# Patient Record
Sex: Female | Born: 1955
Health system: Southern US, Community
[De-identification: ages and names within clinical notes are randomized; demographics above are authoritative.]

## PROBLEM LIST (undated history)

## (undated) DIAGNOSIS — K5150 Left sided colitis without complications: Secondary | ICD-10-CM

## (undated) DIAGNOSIS — K5790 Diverticulosis of intestine, part unspecified, without perforation or abscess without bleeding: Secondary | ICD-10-CM

## (undated) DIAGNOSIS — Z860100 Personal history of colon polyps, unspecified: Secondary | ICD-10-CM

## (undated) DIAGNOSIS — I10 Essential (primary) hypertension: Secondary | ICD-10-CM

## (undated) DIAGNOSIS — E785 Hyperlipidemia, unspecified: Secondary | ICD-10-CM

## (undated) DIAGNOSIS — K649 Unspecified hemorrhoids: Secondary | ICD-10-CM

## (undated) DIAGNOSIS — C539 Malignant neoplasm of cervix uteri, unspecified: Secondary | ICD-10-CM

## (undated) DIAGNOSIS — L409 Psoriasis, unspecified: Secondary | ICD-10-CM

## (undated) DIAGNOSIS — K219 Gastro-esophageal reflux disease without esophagitis: Secondary | ICD-10-CM

## (undated) DIAGNOSIS — E559 Vitamin D deficiency, unspecified: Secondary | ICD-10-CM

## (undated) HISTORY — DX: Gastro-esophageal reflux disease without esophagitis: K21.9

## (undated) HISTORY — DX: Essential (primary) hypertension: I10

## (undated) HISTORY — PX: EYE SURGERY: SHX253

## (undated) HISTORY — DX: Psoriasis, unspecified: L40.9

## (undated) HISTORY — DX: Vitamin D deficiency, unspecified: E55.9

## (undated) HISTORY — PX: BREAST BIOPSY: SHX20

## (undated) HISTORY — PX: TOTAL ABDOMINAL HYSTERECTOMY: SHX209

## (undated) HISTORY — PX: CHOLECYSTECTOMY: SHX55

## (undated) HISTORY — PX: DIAGNOSTIC LAPAROSCOPY: SUR761

## (undated) HISTORY — PX: UPPER GASTROINTESTINAL ENDOSCOPY: SHX188

## (undated) HISTORY — PX: ABDOMINAL HYSTERECTOMY: SHX81

---

## 2004-09-23 ENCOUNTER — Ambulatory Visit: Payer: Self-pay | Admitting: General Practice

## 2006-07-14 ENCOUNTER — Ambulatory Visit: Payer: Self-pay | Admitting: Family Medicine

## 2006-09-15 ENCOUNTER — Ambulatory Visit: Payer: Self-pay | Admitting: Unknown Physician Specialty

## 2006-09-21 ENCOUNTER — Ambulatory Visit: Payer: Self-pay | Admitting: Unknown Physician Specialty

## 2007-03-01 ENCOUNTER — Ambulatory Visit: Payer: Self-pay | Admitting: Unknown Physician Specialty

## 2007-03-29 ENCOUNTER — Ambulatory Visit: Payer: Self-pay | Admitting: Unknown Physician Specialty

## 2007-05-24 ENCOUNTER — Ambulatory Visit: Payer: Self-pay | Admitting: Unknown Physician Specialty

## 2007-11-29 DIAGNOSIS — D239 Other benign neoplasm of skin, unspecified: Secondary | ICD-10-CM

## 2007-11-29 HISTORY — DX: Other benign neoplasm of skin, unspecified: D23.9

## 2008-08-26 ENCOUNTER — Ambulatory Visit: Payer: Self-pay | Admitting: Unknown Physician Specialty

## 2008-10-16 ENCOUNTER — Ambulatory Visit: Payer: Self-pay | Admitting: Unknown Physician Specialty

## 2008-11-05 ENCOUNTER — Ambulatory Visit: Payer: Self-pay | Admitting: Unknown Physician Specialty

## 2008-11-05 LAB — HM COLONOSCOPY: HM Colonoscopy: NORMAL

## 2009-07-23 ENCOUNTER — Ambulatory Visit: Payer: Self-pay | Admitting: Ophthalmology

## 2009-08-20 ENCOUNTER — Ambulatory Visit: Payer: Self-pay | Admitting: Ophthalmology

## 2009-10-23 ENCOUNTER — Ambulatory Visit: Payer: Self-pay | Admitting: Unknown Physician Specialty

## 2010-01-12 ENCOUNTER — Other Ambulatory Visit: Payer: Self-pay

## 2010-01-21 ENCOUNTER — Ambulatory Visit: Payer: Self-pay | Admitting: Unknown Physician Specialty

## 2010-07-15 ENCOUNTER — Other Ambulatory Visit: Payer: Self-pay

## 2010-10-12 ENCOUNTER — Ambulatory Visit: Payer: Self-pay | Admitting: Ophthalmology

## 2010-10-12 DIAGNOSIS — I10 Essential (primary) hypertension: Secondary | ICD-10-CM

## 2010-10-13 ENCOUNTER — Other Ambulatory Visit: Payer: Self-pay | Admitting: Unknown Physician Specialty

## 2010-10-14 ENCOUNTER — Ambulatory Visit: Payer: Self-pay | Admitting: Ophthalmology

## 2010-12-08 ENCOUNTER — Other Ambulatory Visit: Payer: Self-pay

## 2011-05-19 ENCOUNTER — Other Ambulatory Visit: Payer: Self-pay | Admitting: Unknown Physician Specialty

## 2011-05-19 ENCOUNTER — Ambulatory Visit: Payer: Self-pay | Admitting: General Practice

## 2011-05-24 ENCOUNTER — Ambulatory Visit: Payer: Self-pay | Admitting: Unknown Physician Specialty

## 2011-06-05 ENCOUNTER — Ambulatory Visit: Payer: Self-pay | Admitting: General Practice

## 2011-07-06 ENCOUNTER — Ambulatory Visit: Payer: Self-pay | Admitting: General Practice

## 2012-01-11 ENCOUNTER — Ambulatory Visit: Payer: Self-pay | Admitting: Unknown Physician Specialty

## 2012-01-18 ENCOUNTER — Ambulatory Visit: Payer: Self-pay | Admitting: Cardiology

## 2012-01-24 ENCOUNTER — Ambulatory Visit: Payer: Self-pay | Admitting: Cardiology

## 2012-05-24 ENCOUNTER — Ambulatory Visit: Payer: Self-pay

## 2012-08-07 ENCOUNTER — Ambulatory Visit: Payer: Self-pay | Admitting: General Practice

## 2012-11-01 LAB — HM PAP SMEAR: HM PAP: NORMAL

## 2013-05-17 ENCOUNTER — Other Ambulatory Visit: Payer: Self-pay

## 2013-05-17 LAB — CBC WITH DIFFERENTIAL/PLATELET
Basophil #: 0 10*3/uL (ref 0.0–0.1)
Basophil %: 0.4 %
Eosinophil #: 0.2 10*3/uL (ref 0.0–0.7)
Eosinophil %: 2.6 %
HCT: 39.4 % (ref 35.0–47.0)
HGB: 13.7 g/dL (ref 12.0–16.0)
Lymphocyte #: 2.1 10*3/uL (ref 1.0–3.6)
Lymphocyte %: 30.3 %
MCH: 32.7 pg (ref 26.0–34.0)
MCHC: 34.7 g/dL (ref 32.0–36.0)
MCV: 94 fL (ref 80–100)
Monocyte #: 0.7 x10 3/mm (ref 0.2–0.9)
Monocyte %: 9.6 %
Neutrophil #: 3.9 10*3/uL (ref 1.4–6.5)
Neutrophil %: 57.1 %
Platelet: 241 10*3/uL (ref 150–440)
RBC: 4.18 10*6/uL (ref 3.80–5.20)
RDW: 13.5 % (ref 11.5–14.5)
WBC: 6.9 10*3/uL (ref 3.6–11.0)

## 2013-05-17 LAB — BASIC METABOLIC PANEL
Anion Gap: 3 — ABNORMAL LOW (ref 7–16)
BUN: 17 mg/dL (ref 7–18)
Calcium, Total: 9.2 mg/dL (ref 8.5–10.1)
Chloride: 105 mmol/L (ref 98–107)
Co2: 29 mmol/L (ref 21–32)
Creatinine: 0.89 mg/dL (ref 0.60–1.30)
EGFR (African American): >60
EGFR (Non-African Amer.): >60
Glucose: 86 mg/dL (ref 65–99)
Osmolality: 275 (ref 275–301)
Potassium: 3.8 mmol/L (ref 3.5–5.1)
Sodium: 137 mmol/L (ref 136–145)

## 2013-05-17 LAB — SEDIMENTATION RATE: Erythrocyte Sed Rate: 7 mm/hr (ref 0–30)

## 2013-05-29 ENCOUNTER — Ambulatory Visit: Payer: Self-pay | Admitting: Family Medicine

## 2013-06-14 ENCOUNTER — Ambulatory Visit: Payer: Self-pay | Admitting: Family Medicine

## 2014-05-23 ENCOUNTER — Ambulatory Visit: Payer: Self-pay | Admitting: Unknown Physician Specialty

## 2014-06-03 ENCOUNTER — Ambulatory Visit: Payer: Self-pay | Admitting: Family Medicine

## 2014-06-03 LAB — HM MAMMOGRAPHY: HM MAMMO: NORMAL

## 2014-06-04 HISTORY — PX: GALLBLADDER SURGERY: SHX652

## 2014-06-20 ENCOUNTER — Ambulatory Visit: Payer: Self-pay | Admitting: Surgery

## 2014-06-25 ENCOUNTER — Ambulatory Visit: Payer: Self-pay | Admitting: Surgery

## 2014-10-22 LAB — LIPID PANEL
Cholesterol: 240 mg/dL — AB (ref 0–200)
HDL: 64 mg/dL (ref 35–70)
LDL Cholesterol: 148 mg/dL
Triglycerides: 139 mg/dL (ref 40–160)

## 2014-10-22 LAB — BASIC METABOLIC PANEL
CREATININE: 0.7 mg/dL (ref <=1.1)
Glucose: 80 mg/dL

## 2014-10-26 NOTE — Op Note (Signed)
PATIENT NAME:  Theresa Gross, Theresa Gross MR#:  [id number] DATE OF BIRTH:  [birth year]  DATE OF PROCEDURE:  06/25/2014   PREOPERATIVE DIAGNOSIS: Chronic cholecystitis, cholelithiasis.   POSTOPERATIVE DIAGNOSIS: Chronic cholecystitis, cholelithiasis.   PROCEDURE: Laparoscopic cholecystectomy, cholangiogram.   SURGEON: Loreli Dollar, MD  ANESTHESIA: General.   INDICATIONS: This 59 year old has a history of moderate discomfort in the right upper quadrant and frequent episodes of mild nausea. CT scan demonstrated multiple gallstones and surgery was recommended for definitive treatment.   DESCRIPTION OF PROCEDURE: The patient was placed on the operating table in the supine position under general endotracheal anesthesia. The abdomen was prepared with ChloraPrep and draped in a sterile manner.   A short incision was made in the inferior aspect of the umbilicus and carried down through about 4 cm of fatty tissue to encounter the deep fascia, which was grasped with a laryngeal hook and elevated. A Veress needle was inserted, aspirated and irrigated with a saline solution. Next, the peritoneal cavity was inflated with carbon dioxide, and the Veress needle was removed. The 10 mm cannula was inserted. The 10 mm, 0 degree laparoscope was inserted to view the peritoneal cavity. It appeared that the laparoscope was beneath the omentum. The transverse colon was noted. Small bowel appeared typical. With some manipulation, the scope was brought out anterior to the omentum, and then viewed the liver, which had a smooth surface. Location of the gallbladder was demonstrated. Stomach appeared typical. Another incision was made in the epigastrium, slightly to the right of the midline, to introduce an 11 mm cannula. Two incisions were made in the lateral aspect of the right upper quadrant to introduce two 5 mm cannulas.   The gallbladder was retracted towards the right shoulder. A number of adhesions between the liver and the  anterior abdominal wall were lysed sharply with the scissors to allow better traction of the gallbladder. The pouch of Randol Kern was retracted inferiorly and laterally. The porta hepatis was demonstrated. The gallbladder neck was mobilized with incision of the visceral peritoneum. The cystic duct was dissected free from surrounding structures. The cystic artery was dissected free from surrounding structures. One branch of the cystic artery was controlled with Endo Clips and divided. A critical view of the safety was demonstrated. An Endo Clip was placed across the cystic duct, adjacent to the neck of the gallbladder. An incision was made in the cystic duct to introduce a Reddick catheter. Half-strength Conray 60 dye was injected as the cholangiogram was done with fluoroscopy, demonstrating the biliary tree and flow of dye into the duodenum. No retained stones were seen. The Reddick catheter was removed. The cystic duct was doubly ligated with Endo Clips and divided. The cystic artery was controlled with double Endo Clips and divided. The gallbladder was further dissected free from the liver with hook and cautery. Bleeding was very minimal. Hemostasis was subsequently intact. The gallbladder was completely separated and was brought up through the infraumbilical incision, opened, and suctioned. There were multiple stones demonstrated, and these were removed with the stone scoop. It was noted that, with the large size of the stones and some of the stones were hard and could not be crushed, it was necessary to lengthen the skin incision by about 1 cm and also lengthen the fascial incision by 1 cm, which allowed removal of the gallbladder with the remaining stones, and the gallbladder with stones was submitted in formalin for routine pathology.   The right upper quadrant was further inspected. Hemostasis  was intact. The cannulas were removed. Carbon dioxide was allowed to escape from the peritoneal cavity. The fascial  defect at the umbilicus was closed with interrupted 0 Maxon figure-of-eight sutures, placing 2 figure-of-eight sutures. Next, the skin incisions were closed with interrupted 5-0 chromic subcuticular suture, Benzoin, and Steri-Strips. Dressings were applied with paper tape. The patient tolerated surgery satisfactorily and was prepared for transfer to the recovery room.    ____________________________ Lenna Sciara. Rochel Brome, MD [email or web address] D: 06/25/2014 12:49:00 ET T: 06/25/2014 12:59:44 ET JOB#: [id number]  cc: Loreli Dollar, MD, <Dictator> Loreli Dollar MD ELECTRONICALLY SIGNED 07/01/2014 12:31

## 2014-10-28 DIAGNOSIS — E876 Hypokalemia: Secondary | ICD-10-CM | POA: Insufficient documentation

## 2014-10-28 DIAGNOSIS — K219 Gastro-esophageal reflux disease without esophagitis: Secondary | ICD-10-CM | POA: Insufficient documentation

## 2014-10-28 DIAGNOSIS — Z872 Personal history of diseases of the skin and subcutaneous tissue: Secondary | ICD-10-CM | POA: Insufficient documentation

## 2014-10-28 DIAGNOSIS — E7849 Other hyperlipidemia: Secondary | ICD-10-CM | POA: Insufficient documentation

## 2014-10-28 DIAGNOSIS — I10 Essential (primary) hypertension: Secondary | ICD-10-CM | POA: Insufficient documentation

## 2014-10-28 LAB — SURGICAL PATHOLOGY

## 2015-04-08 ENCOUNTER — Other Ambulatory Visit
Admission: RE | Admit: 2015-04-08 | Discharge: 2015-04-08 | Disposition: A | Discharge status: Home / Self Care | Payer: PRIVATE HEALTH INSURANCE | Source: Other Acute Inpatient Hospital | Attending: Nurse Practitioner | Admitting: Nurse Practitioner

## 2015-04-08 DIAGNOSIS — R197 Diarrhea, unspecified: Secondary | ICD-10-CM | POA: Insufficient documentation

## 2015-04-08 LAB — C DIFFICILE QUICK SCREEN W PCR REFLEX
C Diff antigen: NEGATIVE
C Diff interpretation: NEGATIVE
C Diff toxin: NEGATIVE

## 2015-04-11 LAB — STOOL CULTURE

## 2015-04-11 LAB — GIARDIA, EIA; OVA/PARASITE: Giardia Ag, Stl: NEGATIVE

## 2015-04-11 LAB — O&P RESULT

## 2015-10-22 ENCOUNTER — Encounter: Payer: Self-pay | Admitting: Family Medicine

## 2015-10-22 ENCOUNTER — Ambulatory Visit (INDEPENDENT_AMBULATORY_CARE_PROVIDER_SITE_OTHER): Payer: BLUE CROSS/BLUE SHIELD | Admitting: Family Medicine

## 2015-10-22 VITALS — BP 130/92 | HR 72 | Ht 66.0 in | Wt 228.0 lb

## 2015-10-22 DIAGNOSIS — E784 Other hyperlipidemia: Secondary | ICD-10-CM | POA: Diagnosis not present

## 2015-10-22 DIAGNOSIS — Z1211 Encounter for screening for malignant neoplasm of colon: Secondary | ICD-10-CM | POA: Diagnosis not present

## 2015-10-22 DIAGNOSIS — I10 Essential (primary) hypertension: Secondary | ICD-10-CM

## 2015-10-22 DIAGNOSIS — R920 Mammographic microcalcification found on diagnostic imaging of breast: Secondary | ICD-10-CM | POA: Diagnosis not present

## 2015-10-22 DIAGNOSIS — E7849 Other hyperlipidemia: Secondary | ICD-10-CM

## 2015-10-22 DIAGNOSIS — Z0000 Encounter for general adult medical examination without abnormal findings: Secondary | ICD-10-CM | POA: Diagnosis not present

## 2015-10-22 DIAGNOSIS — Z1239 Encounter for other screening for malignant neoplasm of breast: Secondary | ICD-10-CM | POA: Diagnosis not present

## 2015-10-22 LAB — HEMOCCULT GUIAC POC 1CARD (OFFICE): Fecal Occult Blood, POC: NEGATIVE

## 2015-10-22 MED ORDER — HYDROCHLOROTHIAZIDE 12.5 MG PO TABS: 12.5000 mg | ORAL_TABLET | Freq: Every day | ORAL | Status: DC

## 2015-10-22 NOTE — Progress Notes (Signed)
Name: Theresa Gross   MRN: [id number]    DOB: Jul 15, 1955   Date:10/22/2015       Progress Note  Subjective  Chief Complaint  Chief Complaint  Patient presents with  . Annual Exam    HPI Comments: Patient presents for annual physical exam.   No problem-specific assessment & plan notes found for this encounter.   Past Medical History  Diagnosis Date  . Vitamin D deficiency   . GERD (gastroesophageal reflux disease)     Past Surgical History  Procedure Laterality Date  . Gallbladder surgery  06/04/2014    History reviewed. No pertinent family history.  Social History   Social History  . Marital Status: Married    Spouse Name: N/A  . Number of Children: N/A  . Years of Education: N/A   Occupational History  . Not on file.   Social History Main Topics  . Smoking status: Former Research scientist (life sciences)  . Smokeless tobacco: Not on file  . Alcohol Use: 0.0 oz/week    0 Standard drinks or equivalent per week  . Drug Use: No  . Sexual Activity: Not on file   Other Topics Concern  . Not on file   Social History Narrative    Allergies  Allergen Reactions  . Lipitor  [Atorvastatin]   . Metronidazole      Review of Systems  Constitutional: Negative for fever, chills, weight loss and malaise/fatigue.  HENT: Negative for ear discharge, ear pain and sore throat.   Eyes: Negative for blurred vision.  Respiratory: Negative for cough, sputum production, shortness of breath and wheezing.   Cardiovascular: Negative for chest pain, palpitations and leg swelling.  Gastrointestinal: Negative for heartburn, nausea, abdominal pain, diarrhea, constipation, blood in stool and melena.  Genitourinary: Negative for dysuria, urgency, frequency and hematuria.  Musculoskeletal: Negative for myalgias, back pain, joint pain and neck pain.  Skin: Negative for rash.  Neurological: Negative for dizziness, tingling, sensory change, focal weakness and headaches.  Endo/Heme/Allergies: Negative for  environmental allergies and polydipsia. Does not bruise/bleed easily.  Psychiatric/Behavioral: Negative for depression and suicidal ideas. The patient is not nervous/anxious and does not have insomnia.      Objective  Filed Vitals:   10/22/15 0830  BP: 130/92  Pulse: 72  Height: 5\' 6"  (1.676 m)  Weight: 228 lb (103.42 kg)    Physical Exam  Constitutional: She is well-developed, well-nourished, and in no distress. No distress.  HENT:  Head: Normocephalic and atraumatic.  Right Ear: External ear normal.  Left Ear: External ear normal.  Nose: Nose normal.  Mouth/Throat: Oropharynx is clear and moist.  Eyes: Conjunctivae and EOM are normal. Pupils are equal, round, and reactive to light. Right eye exhibits no discharge. Left eye exhibits no discharge.  Neck: Normal range of motion. Neck supple. No JVD present. No thyromegaly present.  Cardiovascular: Normal rate, regular rhythm, normal heart sounds and intact distal pulses.  Exam reveals no gallop and no friction rub.   No murmur heard. Pulmonary/Chest: Effort normal and breath sounds normal. Right breast exhibits no inverted nipple, no mass, no nipple discharge, no skin change and no tenderness. Left breast exhibits no inverted nipple, no mass, no nipple discharge, no skin change and no tenderness. Breasts are symmetrical.  Abdominal: Soft. Bowel sounds are normal. She exhibits no mass. There is no tenderness. There is no guarding.  Genitourinary: Rectum normal and vulva normal.  Musculoskeletal: Normal range of motion. She exhibits no edema.  Lymphadenopathy:    She has  no cervical adenopathy.  Neurological: She is alert. She has normal reflexes.  Skin: Skin is warm and dry. She is not diaphoretic.  Psychiatric: Mood and affect normal.  Nursing note and vitals reviewed.     Assessment & Plan  Problem List Items Addressed This Visit      Cardiovascular and Mediastinum   Essential (primary) hypertension   Relevant  Medications   hydrochlorothiazide (HYDRODIURIL) 12.5 MG tablet     Other   Familial multiple lipoprotein-type hyperlipidemia   Relevant Medications   hydrochlorothiazide (HYDRODIURIL) 12.5 MG tablet    Other Visit Diagnoses    Annual physical exam    -  Primary    Colon cancer screening        Relevant Orders    POCT Occult Blood Stool (Completed)    Breast cancer screening        Relevant Orders    MM Digital Screening    Abnormal mammogram with microcalcification        Relevant Orders    MM Digital Diagnostic Bilat    US BREAST LTD UNI RIGHT INC AXILLA    US BREAST LTD UNI LEFT INC AXILLA         Dr. Khyrie Masi [facility]  10/22/2015

## 2015-11-04 ENCOUNTER — Ambulatory Visit
Admission: RE | Admit: 2015-11-04 | Discharge: 2015-11-04 | Disposition: A | Discharge status: Home / Self Care | Payer: BLUE CROSS/BLUE SHIELD | Source: Ambulatory Visit | Attending: Family Medicine | Admitting: Family Medicine

## 2015-11-04 DIAGNOSIS — R921 Mammographic calcification found on diagnostic imaging of breast: Secondary | ICD-10-CM | POA: Insufficient documentation

## 2015-11-04 DIAGNOSIS — R920 Mammographic microcalcification found on diagnostic imaging of breast: Secondary | ICD-10-CM

## 2015-11-04 HISTORY — DX: Malignant neoplasm of cervix uteri, unspecified: C53.9

## 2015-11-19 ENCOUNTER — Encounter: Payer: Self-pay | Admitting: Family Medicine

## 2015-11-19 ENCOUNTER — Ambulatory Visit (INDEPENDENT_AMBULATORY_CARE_PROVIDER_SITE_OTHER): Payer: BLUE CROSS/BLUE SHIELD | Admitting: Family Medicine

## 2015-11-19 VITALS — BP 122/80 | HR 76 | Ht 66.0 in | Wt 227.0 lb

## 2015-11-19 DIAGNOSIS — I10 Essential (primary) hypertension: Secondary | ICD-10-CM

## 2015-11-19 MED ORDER — HYDROCHLOROTHIAZIDE 12.5 MG PO TABS: 12.5000 mg | ORAL_TABLET | Freq: Every day | ORAL | Status: DC

## 2015-11-19 NOTE — Patient Instructions (Signed)
Ankle Sprain  An ankle sprain is an injury to the strong, fibrous tissues (ligaments) that hold the bones of your ankle joint together.   CAUSES  An ankle sprain is usually caused by a fall or by twisting your ankle. Ankle sprains most commonly occur when you step on the outer edge of your foot, and your ankle turns inward. People who participate in sports are more prone to these types of injuries.   SYMPTOMS    Pain in your ankle. The pain may be present at rest or only when you are trying to stand or walk.   Swelling.   Bruising. Bruising may develop immediately or within 1 to 2 days after your injury.   Difficulty standing or walking, particularly when turning corners or changing directions.  DIAGNOSIS   Your caregiver will ask you details about your injury and perform a physical exam of your ankle to determine if you have an ankle sprain. During the physical exam, your caregiver will press on and apply pressure to specific areas of your foot and ankle. Your caregiver will try to move your ankle in certain ways. An X-ray exam may be done to be sure a bone was not broken or a ligament did not separate from one of the bones in your ankle (avulsion fracture).   TREATMENT   Certain types of braces can help stabilize your ankle. Your caregiver can make a recommendation for this. Your caregiver may recommend the use of medicine for pain. If your sprain is severe, your caregiver may refer you to a surgeon who helps to restore function to parts of your skeletal system (orthopedist) or a physical therapist.  HOME CARE INSTRUCTIONS    Apply ice to your injury for 1-2 days or as directed by your caregiver. Applying ice helps to reduce inflammation and pain.    Put ice in a plastic bag.    Place a towel between your skin and the bag.    Leave the ice on for 15-20 minutes at a time, every 2 hours while you are awake.   Only take over-the-counter or prescription medicines for pain, discomfort, or fever as directed by  your caregiver.   Elevate your injured ankle above the level of your heart as much as possible for 2-3 days.   If your caregiver recommends crutches, use them as instructed. Gradually put weight on the affected ankle. Continue to use crutches or a cane until you can walk without feeling pain in your ankle.   If you have a plaster splint, wear the splint as directed by your caregiver. Do not rest it on anything harder than a pillow for the first 24 hours. Do not put weight on it. Do not get it wet. You may take it off to take a shower or bath.   You may have been given an elastic bandage to wear around your ankle to provide support. If the elastic bandage is too tight (you have numbness or tingling in your foot or your foot becomes cold and blue), adjust the bandage to make it comfortable.   If you have an air splint, you may blow more air into it or let air out to make it more comfortable. You may take your splint off at night and before taking a shower or bath. Wiggle your toes in the splint several times per day to decrease swelling.  SEEK MEDICAL CARE IF:    You have rapidly increasing bruising or swelling.   Your toes feel   extremely cold or you lose feeling in your foot.   Your pain is not relieved with medicine.  SEEK IMMEDIATE MEDICAL CARE IF:   Your toes are numb or blue.   You have severe pain that is increasing.  MAKE SURE YOU:    Understand these instructions.   Will watch your condition.   Will get help right away if you are not doing well or get worse.     This information is not intended to replace advice given to you by your health care provider. Make sure you discuss any questions you have with your health care provider.     Document Released: 06/21/2005 Document Revised: 07/12/2014 Document Reviewed: 07/03/2011  Elsevier Interactive Patient Education 2016 [employer] Inc[employer]

## 2015-11-19 NOTE — Progress Notes (Signed)
Name: Theresa Gross   MRN: [id number]    DOB: 04-14-1956   Date:11/19/2015       Progress Note  Subjective  Chief Complaint  Chief Complaint  Patient presents with  . Hypertension    follow up on HCTZ    Hypertension This is a new problem. The current episode started more than 1 month ago. The problem has been gradually improving since onset. The problem is controlled. Pertinent negatives include no anxiety, blurred vision, chest pain, headaches, malaise/fatigue, neck pain, orthopnea, palpitations, peripheral edema, PND, shortness of breath or sweats. There are no associated agents to hypertension. There are no known risk factors for coronary artery disease. Past treatments include diuretics. The current treatment provides moderate improvement. There are no compliance problems.  There is no history of angina, kidney disease, CAD/MI, CVA, heart failure, left ventricular hypertrophy, PVD, renovascular disease or retinopathy. There is no history of chronic renal disease or a hypertension causing med.    No problem-specific assessment & plan notes found for this encounter.   Past Medical History  Diagnosis Date  . Vitamin D deficiency   . GERD (gastroesophageal reflux disease)   . Cervical cancer [facility])     age 60    Past Surgical History  Procedure Laterality Date  . Gallbladder surgery  06/04/2014  . Breast biopsy Right     Family History  Problem Relation Age of Onset  . Colon cancer Mother     Social History   Social History  . Marital Status: Married    Spouse Name: N/A  . Number of Children: N/A  . Years of Education: N/A   Occupational History  . Not on file.   Social History Main Topics  . Smoking status: Former Research scientist (life sciences)  . Smokeless tobacco: Not on file  . Alcohol Use: 0.0 oz/week    0 Standard drinks or equivalent per week  . Drug Use: No  . Sexual Activity: Not on file   Other Topics Concern  . Not on file   Social History Narrative    Allergies   Allergen Reactions  . Lipitor  [Atorvastatin]   . Metronidazole      Review of Systems  Constitutional: Negative for fever, chills, weight loss and malaise/fatigue.  HENT: Negative for ear discharge, ear pain and sore throat.   Eyes: Negative for blurred vision.  Respiratory: Negative for cough, sputum production, shortness of breath and wheezing.   Cardiovascular: Negative for chest pain, palpitations, orthopnea, leg swelling and PND.  Gastrointestinal: Negative for heartburn, nausea, abdominal pain, diarrhea, constipation, blood in stool and melena.  Genitourinary: Negative for dysuria, urgency, frequency and hematuria.  Musculoskeletal: Negative for myalgias, back pain, joint pain and neck pain.  Skin: Negative for rash.  Neurological: Negative for dizziness, tingling, sensory change, focal weakness and headaches.  Endo/Heme/Allergies: Negative for environmental allergies and polydipsia. Does not bruise/bleed easily.  Psychiatric/Behavioral: Negative for depression and suicidal ideas. The patient is not nervous/anxious and does not have insomnia.      Objective  Filed Vitals:   11/19/15 0840  BP: 122/80  Pulse: 76  Height: 5\' 6"  (1.676 m)  Weight: 227 lb (102.967 kg)    Physical Exam  Constitutional: She is well-developed, well-nourished, and in no distress. No distress.  HENT:  Head: Normocephalic and atraumatic.  Right Ear: External ear normal.  Left Ear: External ear normal.  Nose: Nose normal.  Mouth/Throat: Oropharynx is clear and moist.  Eyes: Conjunctivae and EOM are normal. Pupils are  equal, round, and reactive to light. Right eye exhibits no discharge. Left eye exhibits no discharge.  Neck: Normal range of motion. Neck supple. No JVD present. No thyromegaly present.  Cardiovascular: Normal rate, regular rhythm, normal heart sounds and intact distal pulses.  Exam reveals no gallop and no friction rub.   No murmur heard. Pulmonary/Chest: Effort normal and  breath sounds normal.  Abdominal: Soft. Bowel sounds are normal. She exhibits no mass. There is no tenderness. There is no guarding.  Musculoskeletal: Normal range of motion. She exhibits no edema.  Lymphadenopathy:    She has no cervical adenopathy.  Neurological: She is alert.  Skin: Skin is warm and dry. She is not diaphoretic.  Psychiatric: Mood and affect normal.  Nursing note and vitals reviewed.     Assessment & Plan  Problem List Items Addressed This Visit      Cardiovascular and Mediastinum   Essential (primary) hypertension - Primary   Relevant Medications   hydrochlorothiazide (HYDRODIURIL) 12.5 MG tablet   Other Relevant Orders   Renal Function Panel        Dr. Otilio Miu [facility]  11/19/2015

## 2015-11-20 LAB — RENAL FUNCTION PANEL
Albumin: 4.2 g/dL (ref 3.6–4.8)
BUN/Creatinine Ratio: 18 (ref 12–28)
BUN: 13 mg/dL (ref 8–27)
CALCIUM: 9.4 mg/dL (ref 8.7–10.3)
CHLORIDE: 98 mmol/L (ref 96–106)
CO2: 22 mmol/L (ref 18–29)
CREATININE: 0.74 mg/dL (ref 0.57–1.00)
GFR calc Af Amer: 101 mL/min/{1.73_m2} (ref >59)
GFR calc non Af Amer: 88 mL/min/{1.73_m2} (ref >59)
Glucose: 83 mg/dL (ref 65–99)
PHOSPHORUS: 3.6 mg/dL (ref 2.5–4.5)
Potassium: 4.1 mmol/L (ref 3.5–5.2)
Sodium: 142 mmol/L (ref 134–144)

## 2015-11-22 LAB — SPECIMEN STATUS REPORT

## 2015-11-22 LAB — LIPID PANEL WITH LDL/HDL RATIO
Cholesterol, Total: 264 mg/dL — ABNORMAL HIGH (ref 100–199)
HDL: 56 mg/dL (ref >39)
LDL CALC: 167 mg/dL — AB (ref 0–99)
LDL/HDL RATIO: 3 ratio (ref 0.0–3.2)
Triglycerides: 204 mg/dL — ABNORMAL HIGH (ref 0–149)
VLDL CHOLESTEROL CAL: 41 mg/dL — AB (ref 5–40)

## 2016-05-25 ENCOUNTER — Encounter: Payer: Self-pay | Admitting: Family Medicine

## 2016-05-25 ENCOUNTER — Ambulatory Visit (INDEPENDENT_AMBULATORY_CARE_PROVIDER_SITE_OTHER): Payer: BLUE CROSS/BLUE SHIELD | Admitting: Family Medicine

## 2016-05-25 VITALS — BP 120/80 | HR 70 | Ht 66.0 in | Wt 231.0 lb

## 2016-05-25 DIAGNOSIS — E782 Mixed hyperlipidemia: Secondary | ICD-10-CM

## 2016-05-25 DIAGNOSIS — K760 Fatty (change of) liver, not elsewhere classified: Secondary | ICD-10-CM

## 2016-05-25 DIAGNOSIS — I10 Essential (primary) hypertension: Secondary | ICD-10-CM

## 2016-05-25 DIAGNOSIS — I700 Atherosclerosis of aorta: Secondary | ICD-10-CM | POA: Insufficient documentation

## 2016-05-25 DIAGNOSIS — H6121 Impacted cerumen, right ear: Secondary | ICD-10-CM | POA: Diagnosis not present

## 2016-05-25 DIAGNOSIS — Z1159 Encounter for screening for other viral diseases: Secondary | ICD-10-CM | POA: Diagnosis not present

## 2016-05-25 MED ORDER — HYDROCHLOROTHIAZIDE 12.5 MG PO TABS: 12.5000 mg | ORAL_TABLET | Freq: Every day | ORAL | 1 refills | Status: DC

## 2016-05-25 NOTE — Progress Notes (Signed)
Name: Theresa Gross   MRN: [id number]    DOB: 10-10-1955   Date:05/25/2016       Progress Note  Subjective  Chief Complaint  Chief Complaint  Patient presents with  . Hypertension    needs refill on B/P med    Hypertension  This is a chronic problem. The current episode started more than 1 year ago. The problem has been gradually improving since onset. The problem is controlled. Pertinent negatives include no anxiety, blurred vision, chest pain, headaches, malaise/fatigue, neck pain, orthopnea, palpitations, peripheral edema, PND, shortness of breath or sweats. There are no associated agents to hypertension. Risk factors for coronary artery disease include obesity and post-menopausal state. Past treatments include diuretics. The current treatment provides mild improvement. There are no compliance problems.  There is no history of angina, kidney disease, CAD/MI, CVA, heart failure, left ventricular hypertrophy, PVD, renovascular disease or retinopathy. There is no history of chronic renal disease or a hypertension causing med.    No problem-specific Assessment & Plan notes found for this encounter.   Past Medical History:  Diagnosis Date  . Cervical cancer [facility])    age 12  . GERD (gastroesophageal reflux disease)   . Hypertension   . Vitamin D deficiency     Past Surgical History:  Procedure Laterality Date  . BREAST BIOPSY Right   . GALLBLADDER SURGERY  06/04/2014    Family History  Problem Relation Age of Onset  . Colon cancer Mother     Social History   Social History  . Marital status: Married    Spouse name: N/A  . Number of children: N/A  . Years of education: N/A   Occupational History  . Not on file.   Social History Main Topics  . Smoking status: Former Research scientist (life sciences)  . Smokeless tobacco: Never Used  . Alcohol use 0.0 oz/week  . Drug use: No  . Sexual activity: Yes   Other Topics Concern  . Not on file   Social History Narrative  . No narrative on file     Allergies  Allergen Reactions  . Lipitor  [Atorvastatin]   . Metronidazole      Review of Systems  Constitutional: Negative for chills, fever, malaise/fatigue and weight loss.  HENT: Negative for ear discharge, ear pain and sore throat.   Eyes: Negative for blurred vision.  Respiratory: Negative for cough, sputum production, shortness of breath and wheezing.   Cardiovascular: Negative for chest pain, palpitations, orthopnea, leg swelling and PND.  Gastrointestinal: Negative for abdominal pain, blood in stool, constipation, diarrhea, heartburn, melena and nausea.  Genitourinary: Negative for dysuria, frequency, hematuria and urgency.  Musculoskeletal: Negative for back pain, joint pain, myalgias and neck pain.  Skin: Negative for rash.  Neurological: Negative for dizziness, tingling, sensory change, focal weakness and headaches.  Endo/Heme/Allergies: Negative for environmental allergies and polydipsia. Does not bruise/bleed easily.  Psychiatric/Behavioral: Negative for depression and suicidal ideas. The patient is not nervous/anxious and does not have insomnia.      Objective  Vitals:   05/25/16 0857  BP: 120/80  Pulse: 70  Weight: 231 lb (104.8 kg)  Height: 5\' 6"  (1.676 m)    Physical Exam  Constitutional: She is well-developed, well-nourished, and in no distress. No distress.  HENT:  Head: Normocephalic and atraumatic.  Right Ear: External ear normal.  Left Ear: Tympanic membrane, external ear and ear canal normal.  Nose: Nose normal.  Mouth/Throat: Oropharynx is clear and moist.  Cerumen impacted right  Eyes:  Conjunctivae and EOM are normal. Pupils are equal, round, and reactive to light. Right eye exhibits no discharge. Left eye exhibits no discharge.  Neck: Normal range of motion. Neck supple. No JVD present. No thyromegaly present.  Cardiovascular: Normal rate, regular rhythm, S1 normal, S2 normal, normal heart sounds, intact distal pulses and normal pulses.   Exam reveals no gallop, no S3, no S4 and no friction rub.   No murmur heard. Pulmonary/Chest: Effort normal and breath sounds normal. She has no wheezes. She has no rales.  Abdominal: Soft. Bowel sounds are normal. She exhibits no mass. There is no tenderness. There is no guarding.  Musculoskeletal: Normal range of motion. She exhibits no edema.  Lymphadenopathy:    She has no cervical adenopathy.  Neurological: She is alert. She has normal reflexes.  Skin: Skin is warm and dry. She is not diaphoretic.  Psychiatric: Mood and affect normal.  Nursing note and vitals reviewed.     Assessment & Plan  Problem List Items Addressed This Visit      Cardiovascular and Mediastinum   Essential (primary) hypertension - Primary   Relevant Medications   hydrochlorothiazide (HYDRODIURIL) 12.5 MG tablet   Other Relevant Orders   Renal function panel   Aortic atherosclerosis (HCC)   Relevant Medications   hydrochlorothiazide (HYDRODIURIL) 12.5 MG tablet     Digestive   Steatosis of liver   Relevant Orders   Hepatitis C antibody     Other   Mixed hyperlipidemia   Relevant Medications   hydrochlorothiazide (HYDRODIURIL) 12.5 MG tablet   Other Relevant Orders   Lipid Profile    Other Visit Diagnoses    Need for hepatitis C screening test       Relevant Orders   Hepatitis C antibody   Impacted cerumen of right ear       irragation        Dr. Otilio Miu [facility]  05/25/16

## 2016-05-25 NOTE — Addendum Note (Signed)
Addended by: Juline Patch on: 05/25/2016 04:42 PM   Modules accepted: Orders

## 2016-05-26 LAB — RENAL FUNCTION PANEL
ALBUMIN: 4.3 g/dL (ref 3.6–4.8)
BUN/Creatinine Ratio: 15 (ref 12–28)
BUN: 13 mg/dL (ref 8–27)
CHLORIDE: 99 mmol/L (ref 96–106)
CO2: 24 mmol/L (ref 18–29)
Calcium: 9.4 mg/dL (ref 8.7–10.3)
Creatinine, Ser: 0.87 mg/dL (ref 0.57–1.00)
GFR calc non Af Amer: 72 mL/min/{1.73_m2} (ref >59)
GFR, EST AFRICAN AMERICAN: 83 mL/min/{1.73_m2} (ref >59)
Glucose: 83 mg/dL (ref 65–99)
PHOSPHORUS: 3.7 mg/dL (ref 2.5–4.5)
Potassium: 4.3 mmol/L (ref 3.5–5.2)
Sodium: 142 mmol/L (ref 134–144)

## 2016-05-26 LAB — HEPATITIS C ANTIBODY: HEP C VIRUS AB: 0.1 {s_co_ratio} (ref 0.0–0.9)

## 2016-05-26 LAB — LIPID PANEL
Chol/HDL Ratio: 4.1 ratio units (ref 0.0–4.4)
Cholesterol, Total: 258 mg/dL — ABNORMAL HIGH (ref 100–199)
HDL: 63 mg/dL (ref >39)
LDL Calculated: 167 mg/dL — ABNORMAL HIGH (ref 0–99)
TRIGLYCERIDES: 140 mg/dL (ref 0–149)
VLDL CHOLESTEROL CAL: 28 mg/dL (ref 5–40)

## 2016-06-15 ENCOUNTER — Encounter: Payer: Self-pay | Admitting: *Deleted

## 2016-06-16 ENCOUNTER — Ambulatory Visit: Payer: BLUE CROSS/BLUE SHIELD | Admitting: Anesthesiology

## 2016-06-16 ENCOUNTER — Encounter: Payer: Self-pay | Admitting: *Deleted

## 2016-06-16 ENCOUNTER — Encounter
Admission: RE | Disposition: A | Discharge status: Home / Self Care | Payer: Self-pay | Source: Ambulatory Visit | Attending: Unknown Physician Specialty

## 2016-06-16 ENCOUNTER — Ambulatory Visit
Admission: RE | Admit: 2016-06-16 | Discharge: 2016-06-16 | Disposition: A | Discharge status: Home / Self Care | Payer: BLUE CROSS/BLUE SHIELD | Source: Ambulatory Visit | Attending: Unknown Physician Specialty | Admitting: Unknown Physician Specialty

## 2016-06-16 DIAGNOSIS — I739 Peripheral vascular disease, unspecified: Secondary | ICD-10-CM | POA: Insufficient documentation

## 2016-06-16 DIAGNOSIS — K5730 Diverticulosis of large intestine without perforation or abscess without bleeding: Secondary | ICD-10-CM | POA: Insufficient documentation

## 2016-06-16 DIAGNOSIS — Z1211 Encounter for screening for malignant neoplasm of colon: Secondary | ICD-10-CM | POA: Insufficient documentation

## 2016-06-16 DIAGNOSIS — K640 First degree hemorrhoids: Secondary | ICD-10-CM | POA: Insufficient documentation

## 2016-06-16 DIAGNOSIS — Z79899 Other long term (current) drug therapy: Secondary | ICD-10-CM | POA: Insufficient documentation

## 2016-06-16 DIAGNOSIS — K219 Gastro-esophageal reflux disease without esophagitis: Secondary | ICD-10-CM | POA: Insufficient documentation

## 2016-06-16 DIAGNOSIS — Z8541 Personal history of malignant neoplasm of cervix uteri: Secondary | ICD-10-CM | POA: Insufficient documentation

## 2016-06-16 DIAGNOSIS — Z7982 Long term (current) use of aspirin: Secondary | ICD-10-CM | POA: Diagnosis not present

## 2016-06-16 DIAGNOSIS — I10 Essential (primary) hypertension: Secondary | ICD-10-CM | POA: Diagnosis not present

## 2016-06-16 DIAGNOSIS — Z87891 Personal history of nicotine dependence: Secondary | ICD-10-CM | POA: Insufficient documentation

## 2016-06-16 DIAGNOSIS — E785 Hyperlipidemia, unspecified: Secondary | ICD-10-CM | POA: Insufficient documentation

## 2016-06-16 DIAGNOSIS — K635 Polyp of colon: Secondary | ICD-10-CM | POA: Insufficient documentation

## 2016-06-16 DIAGNOSIS — E559 Vitamin D deficiency, unspecified: Secondary | ICD-10-CM | POA: Insufficient documentation

## 2016-06-16 DIAGNOSIS — Z86010 Personal history of colonic polyps: Secondary | ICD-10-CM | POA: Diagnosis not present

## 2016-06-16 HISTORY — DX: Personal history of colon polyps, unspecified: Z86.0100

## 2016-06-16 HISTORY — PX: COLONOSCOPY WITH PROPOFOL: SHX5780

## 2016-06-16 HISTORY — DX: Unspecified hemorrhoids: K64.9

## 2016-06-16 HISTORY — DX: Hyperlipidemia, unspecified: E78.5

## 2016-06-16 HISTORY — DX: Diverticulosis of intestine, part unspecified, without perforation or abscess without bleeding: K57.90

## 2016-06-16 HISTORY — DX: Personal history of colonic polyps: Z86.010

## 2016-06-16 SURGERY — COLONOSCOPY WITH PROPOFOL
Anesthesia: General

## 2016-06-16 MED ORDER — PROPOFOL 10 MG/ML IV BOLUS
INTRAVENOUS | Status: DC | PRN
  Administered 2016-06-16: 20 mg via INTRAVENOUS
  Administered 2016-06-16: 30 mg via INTRAVENOUS

## 2016-06-16 MED ORDER — MIDAZOLAM HCL 5 MG/5ML IJ SOLN
INTRAMUSCULAR | Status: DC | PRN
  Administered 2016-06-16: 1 mg via INTRAVENOUS

## 2016-06-16 MED ORDER — PROPOFOL 500 MG/50ML IV EMUL
INTRAVENOUS | Status: DC | PRN
  Administered 2016-06-16: 100 ug/kg/min via INTRAVENOUS

## 2016-06-16 MED ORDER — SODIUM CHLORIDE 0.9 % IV SOLN: INTRAVENOUS | Status: DC

## 2016-06-16 MED ORDER — LIDOCAINE HCL (PF) 2 % IJ SOLN
INTRAMUSCULAR | Status: DC | PRN
  Administered 2016-06-16: 50 mg

## 2016-06-16 MED ORDER — SODIUM CHLORIDE 0.9 % IV SOLN
INTRAVENOUS | Status: DC
  Administered 2016-06-16: 1000 mL via INTRAVENOUS

## 2016-06-16 MED ORDER — FENTANYL CITRATE (PF) 100 MCG/2ML IJ SOLN
INTRAMUSCULAR | Status: DC | PRN
  Administered 2016-06-16: 50 ug via INTRAVENOUS

## 2016-06-16 NOTE — Op Note (Signed)
[facility] Gastroenterology Patient Name: Theresa Gross Procedure Date: 06/16/2016 10:40 AM MRN: [id number] Account #: [id number] Date of Birth: [birth year] Admit Type: Outpatient Age: 60 Room: [facility] ENDO ROOM 4 Gender: Female Note Status: Finalized Procedure:            Colonoscopy Indications:          High risk colon cancer surveillance: Personal history                        of colonic polyps Providers:            Manya Silvas, MD Referring MD:         Juline Patch, MD (Referring MD) Medicines:            Propofol per Anesthesia Complications:        No immediate complications. Procedure:            Pre-Anesthesia Assessment:                       - After reviewing the risks and benefits, the patient                        was deemed in satisfactory condition to undergo the                        procedure.                       After obtaining informed consent, the colonoscope was                        passed under direct vision. Throughout the procedure,                        the patient's blood pressure, pulse, and oxygen                        saturations were monitored continuously. The                        Colonoscope was introduced through the anus and                        advanced to the the cecum, identified by appendiceal                        orifice and ileocecal valve. The colonoscopy was                        performed without difficulty. The patient tolerated the                        procedure well. The quality of the bowel preparation                        was excellent. Findings:      Two sessile polyps were found in the transverse colon and ascending       colon. The polyps were diminutive in size. These polyps were removed       with a jumbo cold forceps. Resection and retrieval were complete.  Multiple small and large-mouthed diverticula were found in the sigmoid       colon and descending colon.      Internal  hemorrhoids were found during endoscopy. The hemorrhoids were       medium-sized and Grade I (internal hemorrhoids that do not prolapse).      The entire colon lining looked very good without any inflammation.       Biopsies done of ascending, transverse and descending and sigmoid colon       to look for microscopic colitis. Impression:           - Two diminutive polyps in the transverse colon and in                        the ascending colon, removed with a jumbo cold forceps.                        Resected and retrieved.                       - Diverticulosis in the sigmoid colon and in the                        descending colon.                       - Internal hemorrhoids. Recommendation:       - Await pathology results. Manya Silvas, MD 06/16/2016 11:11:10 AM This report has been signed electronically. Number of Addenda: 0 Note Initiated On: 06/16/2016 10:40 AM Scope Withdrawal Time: 0 hours 14 minutes 44 seconds  Total Procedure Duration: 0 hours 20 minutes 46 seconds       [facility]

## 2016-06-16 NOTE — Transfer of Care (Signed)
Immediate Anesthesia Transfer of Care Note  Patient: Theresa Gross  Procedure(s) Performed: Procedure(s): COLONOSCOPY WITH PROPOFOL (N/A)  Patient Location: PACU  Anesthesia Type:General  Level of Consciousness: sedated  Airway & Oxygen Therapy: Patient Spontanous Breathing  Post-op Assessment: Report given to RN and Post -op Vital signs reviewed and stable  Post vital signs: Reviewed and stable  Last Vitals:  Vitals:   06/16/16 0958 06/16/16 1110  BP: 133/87   Pulse: 82   Resp: 20   Temp: 36.9 C (!) (P) 35.9 C    Last Pain:  Vitals:   06/16/16 1110  TempSrc: (P) Tympanic         Complications: No apparent anesthesia complications

## 2016-06-16 NOTE — H&P (Signed)
Primary Care Physician:  Otilio Miu, MD Primary Gastroenterologist:  Dr. Vira Agar  Pre-Procedure History & Physical: HPI:  Theresa Gross is a 60 y.o. female is here for an colonoscopy.   Past Medical History:  Diagnosis Date  . Cervical cancer [facility])    age 55  . Diverticulosis   . GERD (gastroesophageal reflux disease)   . Hemorrhoids   . History of colon polyps   . Hyperlipemia   . Hypertension   . Vitamin D deficiency     Past Surgical History:  Procedure Laterality Date  . ABDOMINAL HYSTERECTOMY    . BREAST BIOPSY Right   . CHOLECYSTECTOMY    . DIAGNOSTIC LAPAROSCOPY    . EYE SURGERY    . GALLBLADDER SURGERY  06/04/2014    Prior to Admission medications   Medication Sig Start Date End Date Taking? Authorizing Provider  Apremilast 30 MG TABS Take 1 tablet by mouth 2 (two) times daily. Derm   Yes Historical Provider, MD  aspirin 81 MG tablet Take 1 tablet by mouth daily.   Yes Historical Provider, MD  Cholecalciferol (VITAMIN D) 2000 units tablet Take 2,000 Units by mouth daily.   Yes Historical Provider, MD  hydrochlorothiazide (HYDRODIURIL) 12.5 MG tablet Take 1 tablet (12.5 mg total) by mouth daily. 05/25/16  Yes Juline Patch, MD  lansoprazole (PREVACID) 30 MG capsule Take 1 capsule by mouth daily. GI Doc 10/22/14  Yes Historical Provider, MD  mesalamine (LIALDA) 1.2 g EC tablet Take 2 tablets by mouth daily. GI Doc 04/15/15  Yes Historical Provider, MD  nystatin-triamcinolone (MYCOLOG II) cream Apply 1 application topically 2 (two) times daily. PRN/ Derm 10/09/13  Yes Historical Provider, MD    Allergies as of 06/02/2016 - Review Complete 05/25/2016  Allergen Reaction Noted  . Lipitor  [atorvastatin]  10/28/2014  . Metronidazole  10/28/2014    Family History  Problem Relation Age of Onset  . Colon cancer Mother     Social History   Social History  . Marital status: Married    Spouse name: N/A  . Number of children: N/A  . Years of education: N/A    Occupational History  . Not on file.   Social History Main Topics  . Smoking status: Former Research scientist (life sciences)  . Smokeless tobacco: Never Used  . Alcohol use 0.6 oz/week    1 Cans of beer per week  . Drug use: No  . Sexual activity: Yes   Other Topics Concern  . Not on file   Social History Narrative  . No narrative on file    Review of Systems: See HPI, otherwise negative ROS  Physical Exam: BP 133/87   Pulse 82   Temp 98.5 F (36.9 C)   Resp 20   Ht 5\' 6"  (1.676 m)   Wt 104.3 kg (230 lb)   SpO2 98%   BMI 37.12 kg/m  General:   Alert,  pleasant and cooperative in NAD Head:  Normocephalic and atraumatic. Neck:  Supple; no masses or thyromegaly. Lungs:  Clear throughout to auscultation.    Heart:  Regular rate and rhythm. Abdomen:  Soft, nontender and nondistended. Normal bowel sounds, without guarding, and without rebound.   Neurologic:  Alert and  oriented x4;  grossly normal neurologically.  Impression/Plan: Theresa Gross is here for an colonoscopy to be performed for [facility] colon polyps  Risks, benefits, limitations, and alternatives regarding  colonoscopy have been reviewed with the patient.  Questions have been answered.  All parties  agreeable.   Gaylyn Cheers, MD  06/16/2016, 10:38 AM

## 2016-06-16 NOTE — Anesthesia Preprocedure Evaluation (Signed)
Anesthesia Evaluation  Patient identified by MRN, date of birth, ID band Patient awake    Reviewed: Allergy & Precautions, H&P , NPO status , Patient's Chart, lab work & pertinent test results, reviewed documented beta blocker date and time   History of Anesthesia Complications Negative for: history of anesthetic complications  Airway Mallampati: II  TM Distance: >3 FB Neck ROM: full    Dental no notable dental hx. (+) Caps   Pulmonary neg pulmonary ROS, former smoker,    Pulmonary exam normal breath sounds clear to auscultation       Cardiovascular Exercise Tolerance: Good hypertension, (-) angina+ Peripheral Vascular Disease  (-) CAD, (-) Past MI, (-) Cardiac Stents and (-) CABG Normal cardiovascular exam(-) dysrhythmias (-) Valvular Problems/Murmurs Rhythm:regular Rate:Normal     Neuro/Psych negative neurological ROS  negative psych ROS   GI/Hepatic Neg liver ROS, GERD  ,  Endo/Other  negative endocrine ROS  Renal/GU negative Renal ROS  negative genitourinary   Musculoskeletal   Abdominal   Peds  Hematology negative hematology ROS (+)   Anesthesia Other Findings Past Medical History: No date: Cervical cancer (HCC)     Comment: age 60 No date: Diverticulosis No date: GERD (gastroesophageal reflux disease) No date: Hemorrhoids No date: History of colon polyps No date: Hyperlipemia No date: Hypertension No date: Vitamin D deficiency   Reproductive/Obstetrics negative OB ROS                             Anesthesia Physical Anesthesia Plan  ASA: II  Anesthesia Plan: General   Post-op Pain Management:    Induction:   Airway Management Planned:   Additional Equipment:   Intra-op Plan:   Post-operative Plan:   Informed Consent: I have reviewed the patients History and Physical, chart, labs and discussed the procedure including the risks, benefits and alternatives for the  proposed anesthesia with the patient or authorized representative who has indicated his/her understanding and acceptance.   Dental Advisory Given  Plan Discussed with: Anesthesiologist, CRNA and Surgeon  Anesthesia Plan Comments:         Anesthesia Quick Evaluation

## 2016-06-17 ENCOUNTER — Encounter: Payer: Self-pay | Admitting: Unknown Physician Specialty

## 2016-06-18 LAB — SURGICAL PATHOLOGY

## 2016-06-18 NOTE — Anesthesia Postprocedure Evaluation (Signed)
Anesthesia Post Note  Patient: Theresa Gross  Procedure(s) Performed: Procedure(s) (LRB): COLONOSCOPY WITH PROPOFOL (N/A)  Patient location during evaluation: Endoscopy Anesthesia Type: General Level of consciousness: awake and alert Pain management: pain level controlled Vital Signs Assessment: post-procedure vital signs reviewed and stable Respiratory status: spontaneous breathing, nonlabored ventilation, respiratory function stable and patient connected to nasal cannula oxygen Cardiovascular status: blood pressure returned to baseline and stable Postop Assessment: no signs of nausea or vomiting Anesthetic complications: no    Last Vitals:  Vitals:   06/16/16 1130 06/16/16 1140  BP: 111/80 117/75  Pulse:    Resp:    Temp:      Last Pain:  Vitals:   06/17/16 0803  TempSrc:   PainSc: 0-No pain                 Martha Clan

## 2016-06-24 ENCOUNTER — Ambulatory Visit (INDEPENDENT_AMBULATORY_CARE_PROVIDER_SITE_OTHER): Payer: BLUE CROSS/BLUE SHIELD | Admitting: Family Medicine

## 2016-06-24 ENCOUNTER — Encounter: Payer: Self-pay | Admitting: Family Medicine

## 2016-06-24 VITALS — BP 120/80 | HR 80 | Ht 66.0 in | Wt 232.0 lb

## 2016-06-24 DIAGNOSIS — N3090 Cystitis, unspecified without hematuria: Secondary | ICD-10-CM | POA: Diagnosis not present

## 2016-06-24 DIAGNOSIS — B379 Candidiasis, unspecified: Secondary | ICD-10-CM | POA: Diagnosis not present

## 2016-06-24 MED ORDER — FLUCONAZOLE 150 MG PO TABS: 150.0000 mg | ORAL_TABLET | Freq: Once | ORAL | 0 refills | Status: AC

## 2016-06-24 MED ORDER — SULFAMETHOXAZOLE-TRIMETHOPRIM 800-160 MG PO TABS: 1.0000 | ORAL_TABLET | Freq: Two times a day (BID) | ORAL | 0 refills | Status: DC

## 2016-06-24 NOTE — Progress Notes (Signed)
Name: Theresa Gross   MRN: [id number]    DOB: 16-Jun-1956   Date:06/24/2016       Progress Note  Subjective  Chief Complaint  Chief Complaint  Patient presents with  . Urinary Tract Infection    pressure and burning sensation- Has been taking AZO/ orange urine- not able to read    Urinary Tract Infection   This is a new problem. The current episode started in the past 7 days. The problem occurs every urination. The problem has been waxing and waning. The quality of the pain is described as burning (pressure suprapubic). The pain is at a severity of 3/10. The pain is moderate. There has been no fever. Associated symptoms include frequency and urgency. Pertinent negatives include no chills, discharge, flank pain, hematuria, hesitancy, nausea, sweats or vomiting. Treatments tried: azo. The treatment provided moderate relief.    No problem-specific Assessment & Plan notes found for this encounter.   Past Medical History:  Diagnosis Date  . Cervical cancer [facility])    age 60  . Diverticulosis   . GERD (gastroesophageal reflux disease)   . Hemorrhoids   . History of colon polyps   . Hyperlipemia   . Hypertension   . Vitamin D deficiency     Past Surgical History:  Procedure Laterality Date  . ABDOMINAL HYSTERECTOMY    . BREAST BIOPSY Right   . CHOLECYSTECTOMY    . COLONOSCOPY WITH PROPOFOL N/A 06/16/2016   Procedure: COLONOSCOPY WITH PROPOFOL;  Surgeon: Manya Silvas, MD;  Location: [facility] ENDOSCOPY;  Service: Endoscopy;  Laterality: N/A;  . DIAGNOSTIC LAPAROSCOPY    . EYE SURGERY    . GALLBLADDER SURGERY  06/04/2014    Family History  Problem Relation Age of Onset  . Colon cancer Mother     Social History   Social History  . Marital status: Married    Spouse name: N/A  . Number of children: N/A  . Years of education: N/A   Occupational History  . Not on file.   Social History Main Topics  . Smoking status: Former Research scientist (life sciences)  . Smokeless tobacco: Never Used  .  Alcohol use 0.6 oz/week    1 Cans of beer per week  . Drug use: No  . Sexual activity: Yes   Other Topics Concern  . Not on file   Social History Narrative  . No narrative on file    Allergies  Allergen Reactions  . Lipitor  [Atorvastatin]   . Metronidazole   . Nsaids Other (See Comments)     Review of Systems  Constitutional: Negative for chills, fever, malaise/fatigue and weight loss.  HENT: Negative for ear discharge, ear pain and sore throat.   Eyes: Negative for blurred vision.  Respiratory: Negative for cough, sputum production, shortness of breath and wheezing.   Cardiovascular: Negative for chest pain, palpitations and leg swelling.  Gastrointestinal: Negative for abdominal pain, blood in stool, constipation, diarrhea, heartburn, melena, nausea and vomiting.  Genitourinary: Positive for dysuria, frequency and urgency. Negative for flank pain, hematuria and hesitancy.  Musculoskeletal: Negative for back pain, joint pain, myalgias and neck pain.  Skin: Negative for rash.  Neurological: Negative for dizziness, tingling, sensory change, focal weakness and headaches.  Endo/Heme/Allergies: Negative for environmental allergies and polydipsia. Does not bruise/bleed easily.  Psychiatric/Behavioral: Negative for depression and suicidal ideas. The patient is not nervous/anxious and does not have insomnia.      Objective  Vitals:   06/24/16 0911  BP: 120/80  Pulse:  80  Weight: 232 lb (105.2 kg)  Height: 5\' 6"  (1.676 m)    Physical Exam  Constitutional: She is well-developed, well-nourished, and in no distress. No distress.  HENT:  Head: Normocephalic and atraumatic.  Right Ear: External ear normal.  Left Ear: External ear normal.  Nose: Nose normal.  Mouth/Throat: Oropharynx is clear and moist.  Eyes: Conjunctivae and EOM are normal. Pupils are equal, round, and reactive to light. Right eye exhibits no discharge. Left eye exhibits no discharge.  Neck: Normal range  of motion. Neck supple. No JVD present. No thyromegaly present.  Cardiovascular: Normal rate, regular rhythm, normal heart sounds and intact distal pulses.  Exam reveals no gallop and no friction rub.   No murmur heard. Pulmonary/Chest: Effort normal and breath sounds normal. She has no wheezes. She has no rales.  Abdominal: Soft. Bowel sounds are normal. She exhibits no mass. There is tenderness in the suprapubic area. There is no guarding.  Musculoskeletal: Normal range of motion. She exhibits no edema.  Lymphadenopathy:    She has no cervical adenopathy.  Neurological: She is alert. She has normal reflexes.  Skin: Skin is warm and dry. She is not diaphoretic.  Psychiatric: Mood and affect normal.  Nursing note and vitals reviewed.     Assessment & Plan  Problem List Items Addressed This Visit    None    Visit Diagnoses    Cystitis    -  Primary   Relevant Medications   sulfamethoxazole-trimethoprim (BACTRIM DS,SEPTRA DS) 800-160 MG tablet   Monilia infection       Relevant Medications   sulfamethoxazole-trimethoprim (BACTRIM DS,SEPTRA DS) 800-160 MG tablet   fluconazole (DIFLUCAN) 150 MG tablet     Couldn't get a urine sample due to taking AZO x 3 days   Dr. [facility]  06/24/16

## 2016-07-26 ENCOUNTER — Ambulatory Visit (INDEPENDENT_AMBULATORY_CARE_PROVIDER_SITE_OTHER): Payer: BLUE CROSS/BLUE SHIELD | Admitting: Family Medicine

## 2016-07-26 ENCOUNTER — Encounter: Payer: Self-pay | Admitting: Family Medicine

## 2016-07-26 VITALS — BP 138/80 | HR 80 | Temp 99.2°F | Ht 66.0 in | Wt 229.0 lb

## 2016-07-26 DIAGNOSIS — J40 Bronchitis, not specified as acute or chronic: Secondary | ICD-10-CM | POA: Diagnosis not present

## 2016-07-26 MED ORDER — GUAIFENESIN-CODEINE 100-10 MG/5ML PO SYRP: 5.0000 mL | ORAL_SOLUTION | Freq: Three times a day (TID) | ORAL | 0 refills | Status: DC | PRN

## 2016-07-26 MED ORDER — FLUCONAZOLE 150 MG PO TABS: 150.0000 mg | ORAL_TABLET | Freq: Once | ORAL | 0 refills | Status: AC

## 2016-07-26 MED ORDER — AMOXICILLIN-POT CLAVULANATE 875-125 MG PO TABS: 1.0000 | ORAL_TABLET | Freq: Two times a day (BID) | ORAL | 0 refills | Status: DC

## 2016-07-26 NOTE — Progress Notes (Signed)
Name: Theresa Gross   MRN: [id number]    DOB: 03-12-1956   Date:07/26/2016       Progress Note  Subjective  Chief Complaint  Chief Complaint  Patient presents with  . Sinusitis    cough- green production and cong- taking Mucinex- helps some. Started 3 days ago    Sinusitis  This is a new problem. The current episode started in the past 7 days. The problem has been gradually worsening since onset. The maximum temperature recorded prior to her arrival was 100.4 - 100.9 F. The pain is mild. Associated symptoms include chills, congestion, coughing, diaphoresis, headaches, sinus pressure, sneezing and a sore throat. Pertinent negatives include no ear pain, hoarse voice, neck pain, shortness of breath or swollen glands. Past treatments include oral decongestants. The treatment provided mild relief.    No problem-specific Assessment & Plan notes found for this encounter.   Past Medical History:  Diagnosis Date  . Cervical cancer [facility])    age 61  . Diverticulosis   . GERD (gastroesophageal reflux disease)   . Hemorrhoids   . History of colon polyps   . Hyperlipemia   . Hypertension   . Vitamin D deficiency     Past Surgical History:  Procedure Laterality Date  . ABDOMINAL HYSTERECTOMY    . BREAST BIOPSY Right   . CHOLECYSTECTOMY    . COLONOSCOPY WITH PROPOFOL N/A 06/16/2016   Procedure: COLONOSCOPY WITH PROPOFOL;  Surgeon: Manya Silvas, MD;  Location: [facility] ENDOSCOPY;  Service: Endoscopy;  Laterality: N/A;  . DIAGNOSTIC LAPAROSCOPY    . EYE SURGERY    . GALLBLADDER SURGERY  06/04/2014    Family History  Problem Relation Age of Onset  . Colon cancer Mother     Social History   Social History  . Marital status: Married    Spouse name: N/A  . Number of children: N/A  . Years of education: N/A   Occupational History  . Not on file.   Social History Main Topics  . Smoking status: Former Research scientist (life sciences)  . Smokeless tobacco: Never Used  . Alcohol use 0.6 oz/week    1 Cans  of beer per week  . Drug use: No  . Sexual activity: Yes   Other Topics Concern  . Not on file   Social History Narrative  . No narrative on file    Allergies  Allergen Reactions  . Lipitor  [Atorvastatin]   . Metronidazole   . Nsaids Other (See Comments)     Review of Systems  Constitutional: Positive for chills and diaphoresis. Negative for fever, malaise/fatigue and weight loss.  HENT: Positive for congestion, sinus pressure, sneezing and sore throat. Negative for ear discharge, ear pain and hoarse voice.   Eyes: Negative for blurred vision.  Respiratory: Positive for cough. Negative for sputum production, shortness of breath and wheezing.   Cardiovascular: Negative for chest pain, palpitations and leg swelling.  Gastrointestinal: Negative for abdominal pain, blood in stool, constipation, diarrhea, heartburn, melena and nausea.  Genitourinary: Negative for dysuria, frequency, hematuria and urgency.  Musculoskeletal: Negative for back pain, joint pain, myalgias and neck pain.  Skin: Negative for rash.  Neurological: Positive for headaches. Negative for dizziness, tingling, sensory change and focal weakness.  Endo/Heme/Allergies: Negative for environmental allergies and polydipsia. Does not bruise/bleed easily.  Psychiatric/Behavioral: Negative for depression and suicidal ideas. The patient is not nervous/anxious and does not have insomnia.      Objective  Vitals:   07/26/16 1126  BP: 138/80  Pulse: 80  Temp: 99.2 F (37.3 C)  TempSrc: Oral  Weight: 229 lb (103.9 kg)  Height: 5\' 6"  (1.676 m)    Physical Exam  Constitutional: She is well-developed, well-nourished, and in no distress. No distress.  HENT:  Head: Normocephalic and atraumatic.  Right Ear: External ear normal.  Left Ear: External ear normal.  Nose: Nose normal.  Mouth/Throat: Oropharynx is clear and moist.  Eyes: Conjunctivae and EOM are normal. Pupils are equal, round, and reactive to light. Right  eye exhibits no discharge. Left eye exhibits no discharge.  Neck: Normal range of motion. Neck supple. No JVD present. No thyromegaly present.  Cardiovascular: Normal rate, regular rhythm, normal heart sounds and intact distal pulses.  Exam reveals no gallop and no friction rub.   No murmur heard. Pulmonary/Chest: Effort normal and breath sounds normal. She has no wheezes. She has no rales.  Abdominal: Soft. Bowel sounds are normal. She exhibits no mass. There is no tenderness. There is no guarding.  Musculoskeletal: Normal range of motion. She exhibits no edema.  Lymphadenopathy:    She has no cervical adenopathy.  Neurological: She is alert. She has normal reflexes.  Skin: Skin is warm and dry. She is not diaphoretic.  Psychiatric: Mood and affect normal.  Nursing note and vitals reviewed.     Assessment & Plan  Problem List Items Addressed This Visit    None    Visit Diagnoses    Bronchitis    -  Primary   Relevant Medications   amoxicillin-clavulanate (AUGMENTIN) 875-125 MG tablet   guaiFENesin-codeine (ROBITUSSIN AC) 100-10 MG/5ML syrup   fluconazole (DIFLUCAN) 150 MG tablet        Dr. [facility]  07/26/16

## 2016-08-16 ENCOUNTER — Ambulatory Visit
Admission: RE | Admit: 2016-08-16 | Discharge: 2016-08-16 | Disposition: A | Discharge status: Home / Self Care | Payer: BLUE CROSS/BLUE SHIELD | Source: Ambulatory Visit | Attending: Family Medicine | Admitting: Family Medicine

## 2016-08-16 ENCOUNTER — Encounter: Payer: Self-pay | Admitting: Family Medicine

## 2016-08-16 ENCOUNTER — Ambulatory Visit (INDEPENDENT_AMBULATORY_CARE_PROVIDER_SITE_OTHER): Payer: BLUE CROSS/BLUE SHIELD | Admitting: Family Medicine

## 2016-08-16 VITALS — BP 120/80 | HR 72 | Temp 98.7°F | Ht 66.0 in | Wt 227.0 lb

## 2016-08-16 DIAGNOSIS — J219 Acute bronchiolitis, unspecified: Secondary | ICD-10-CM

## 2016-08-16 DIAGNOSIS — J4541 Moderate persistent asthma with (acute) exacerbation: Secondary | ICD-10-CM

## 2016-08-16 DIAGNOSIS — R062 Wheezing: Secondary | ICD-10-CM | POA: Insufficient documentation

## 2016-08-16 DIAGNOSIS — R05 Cough: Secondary | ICD-10-CM | POA: Insufficient documentation

## 2016-08-16 MED ORDER — ALBUTEROL SULFATE HFA 108 (90 BASE) MCG/ACT IN AERS: 2.0000 | INHALATION_SPRAY | Freq: Four times a day (QID) | RESPIRATORY_TRACT | 0 refills | Status: DC | PRN

## 2016-08-16 MED ORDER — LEVOFLOXACIN 500 MG PO TABS: 500.0000 mg | ORAL_TABLET | Freq: Every day | ORAL | 0 refills | Status: DC

## 2016-08-16 MED ORDER — PREDNISONE 10 MG PO TABS: 10.0000 mg | ORAL_TABLET | Freq: Every day | ORAL | 0 refills | Status: DC

## 2016-08-16 MED ORDER — IPRATROPIUM-ALBUTEROL 0.5-2.5 (3) MG/3ML IN SOLN: 3.0000 mL | Freq: Once | RESPIRATORY_TRACT | Status: DC

## 2016-08-16 NOTE — Patient Instructions (Signed)
Metered Dose Inhaler With Spacer Inhaled medicines are the basis of treatment of asthma and other breathing problems. Inhaled medicine can only be effective if used properly. Good technique assures that the medicine reaches the lungs. Your health care provider has asked you to use a spacer with your inhaler to help you take the medicine more effectively. A spacer is a plastic tube with a mouthpiece on one end and an opening that connects to the inhaler on the other end. Metered dose inhalers (MDIs) are used to deliver a variety of inhaled medicines. These include quick relief or rescue medicines (such as bronchodilators) and controller medicines (such as corticosteroids). The medicine is delivered by pushing down on a metal canister to release a set amount of spray. If you are using different kinds of inhalers, use your quick relief medicine to open the airways 10-15 minutes before using a steroid if instructed to do so by your health care provider. If you are unsure which inhalers to use and the order of using them, ask your health care provider, nurse, or respiratory therapist. HOW TO USE THE INHALER WITH A SPACER 1. Remove cap from inhaler. 2. If you are using the inhaler for the first time, you will need to prime it. Shake the inhaler for 5 seconds and release four puffs into the air, away from your face. Ask your health care provider or pharmacist if you have questions about priming your inhaler. 3. Shake inhaler for 5 seconds before each breath in (inhalation). 4. Place the open end of the spacer onto the mouthpiece of the inhaler. 5. Position the inhaler so that the top of the canister faces up and the spacer mouthpiece faces you. 6. Put your index finger on the top of the medicine canister. Your thumb supports the bottom of the inhaler and the spacer. 7. Breathe out (exhale) normally and as completely as possible. 8. Immediately after exhaling, place the spacer between your teeth and into your  mouth. Close your mouth tightly around the spacer. 9. Press the canister down with the index finger to release the medicine. 10. At the same time as the canister is pressed, inhale deeply and slowly until the lungs are completely filled. This should take 4-6 seconds. Keep your tongue down and out of the way. 11. Hold the medicine in your lungs for 5-10 seconds (10 seconds is best). This helps the medicine get into the small airways of your lungs. Exhale. 12. Repeat inhaling deeply through the spacer mouthpiece. Again hold that breath for up to 10 seconds (10 seconds is best). Exhale slowly. If it is difficult to take this second deep breath through the spacer, breathe normally several times through the spacer. Remove the spacer from your mouth. 13. Wait at least 15-30 seconds between puffs. Continue with the above steps until you have taken the number of puffs your health care provider has ordered. Do not use the inhaler more than your health care provider directs you to. 14. Remove spacer from the inhaler and place cap on inhaler. 15. Follow the directions from your health care provider or the inhaler insert for cleaning the inhaler and spacer. If you are using a steroid inhaler, rinse your mouth with water after your last puff, gargle, and spit out the water. Do not swallow the water. AVOID:   Inhaling before or after starting the spray of medicine. It takes practice to coordinate your breathing with triggering the spray.  Inhaling through the nose (rather than the mouth) when  triggering the spray. HOW TO DETERMINE IF YOUR INHALER IS FULL OR NEARLY EMPTY You cannot know when an inhaler is empty by shaking it. A few inhalers are now being made with dose counters. Ask your health care provider for a prescription that has a dose counter if you feel you need that extra help. If your inhaler does not have a counter, ask your health care provider to help you determine the date you need to refill your  inhaler. Write the refill date on a calendar or your inhaler canister. Refill your inhaler 7-10 days before it runs out. Be sure to keep an adequate supply of medicine. This includes making sure it is not expired, and you have a spare inhaler.  SEEK MEDICAL CARE IF:   Symptoms are only partially relieved with your inhaler.  You are having trouble using your inhaler.  You experience some increase in phlegm. SEEK IMMEDIATE MEDICAL CARE IF:   You feel little or no relief with your inhalers. You are still wheezing and are feeling shortness of breath or tightness in your chest or both.  You have dizziness, headaches, or fast heart rate.  You have chills, fever, or night sweats.  There is a noticeable increase in phlegm production, or there is blood in the phlegm. This information is not intended to replace advice given to you by your health care provider. Make sure you discuss any questions you have with your health care provider. Document Released: 06/21/2005 Document Revised: 11/05/2014 Document Reviewed: 12/07/2012 Elsevier Interactive Patient Education  2017 [employer].

## 2016-08-16 NOTE — Progress Notes (Signed)
Name: Theresa Gross   MRN: [id number]    DOB: 1955-12-24   Date:08/16/2016       Progress Note  Subjective  Chief Complaint  Chief Complaint  Patient presents with  . Follow-up    finished round of Aug on Jan 1- still has a cough with clear production, wheezing, no appetite, gets hot after having coughing spells    Cough  This is a recurrent problem. The current episode started 1 to 4 weeks ago. The problem has been waxing and waning. The problem occurs every few minutes. The cough is non-productive. Associated symptoms include chest pain, nasal congestion, postnasal drip, rhinorrhea, shortness of breath and wheezing. Pertinent negatives include no chills, ear congestion, ear pain, fever, headaches, heartburn, hemoptysis, myalgias, rash, sore throat, sweats or weight loss. Nothing aggravates the symptoms. Treatments tried: augmentin. The treatment provided mild relief. Her past medical history is significant for asthma. There is no history of environmental allergies.  Asthma  She complains of chest tightness, cough, difficulty breathing, shortness of breath and wheezing. There is no hemoptysis or sputum production. This is a recurrent problem. The current episode started 1 to 4 weeks ago. The problem occurs constantly. The problem has been waxing and waning. The cough is non-productive. Associated symptoms include chest pain, nasal congestion, postnasal drip and rhinorrhea. Pertinent negatives include no ear congestion, ear pain, fever, headaches, heartburn, malaise/fatigue, myalgias, sore throat, sweats or weight loss. Her symptoms are aggravated by change in weather and lying down. Her symptoms are alleviated by nothing. Her past medical history is significant for asthma.    No problem-specific Assessment & Plan notes found for this encounter.   Past Medical History:  Diagnosis Date  . Cervical cancer [facility])    age 61  . Diverticulosis   . GERD (gastroesophageal reflux disease)   .  Hemorrhoids   . History of colon polyps   . Hyperlipemia   . Hypertension   . Vitamin D deficiency     Past Surgical History:  Procedure Laterality Date  . ABDOMINAL HYSTERECTOMY    . BREAST BIOPSY Right   . CHOLECYSTECTOMY    . COLONOSCOPY WITH PROPOFOL N/A 06/16/2016   Procedure: COLONOSCOPY WITH PROPOFOL;  Surgeon: Manya Silvas, MD;  Location: [facility] ENDOSCOPY;  Service: Endoscopy;  Laterality: N/A;  . DIAGNOSTIC LAPAROSCOPY    . EYE SURGERY    . GALLBLADDER SURGERY  06/04/2014    Family History  Problem Relation Age of Onset  . Colon cancer Mother     Social History   Social History  . Marital status: Married    Spouse name: N/A  . Number of children: N/A  . Years of education: N/A   Occupational History  . Not on file.   Social History Main Topics  . Smoking status: Former Research scientist (life sciences)  . Smokeless tobacco: Never Used  . Alcohol use 0.6 oz/week    1 Cans of beer per week  . Drug use: No  . Sexual activity: Yes   Other Topics Concern  . Not on file   Social History Narrative  . No narrative on file    Allergies  Allergen Reactions  . Lipitor  [Atorvastatin]   . Metronidazole   . Nsaids Other (See Comments)     Review of Systems  Constitutional: Negative for chills, fever, malaise/fatigue and weight loss.  HENT: Positive for postnasal drip and rhinorrhea. Negative for ear discharge, ear pain and sore throat.   Eyes: Negative for blurred vision.  Respiratory: Positive for cough, shortness of breath and wheezing. Negative for hemoptysis and sputum production.   Cardiovascular: Positive for chest pain. Negative for palpitations and leg swelling.  Gastrointestinal: Negative for abdominal pain, blood in stool, constipation, diarrhea, heartburn, melena and nausea.  Genitourinary: Negative for dysuria, frequency, hematuria and urgency.  Musculoskeletal: Negative for back pain, joint pain, myalgias and neck pain.  Skin: Negative for rash.  Neurological:  Negative for dizziness, tingling, sensory change, focal weakness and headaches.  Endo/Heme/Allergies: Negative for environmental allergies and polydipsia. Does not bruise/bleed easily.  Psychiatric/Behavioral: Negative for depression and suicidal ideas. The patient is not nervous/anxious and does not have insomnia.      Objective  Vitals:   08/16/16 1350  BP: 120/80  Pulse: 72  Temp: 98.7 F (37.1 C)  TempSrc: Oral  Weight: 227 lb (103 kg)  Height: 5\' 6"  (1.676 m)    Physical Exam  Constitutional: She is well-developed, well-nourished, and in no distress. No distress.  HENT:  Head: Normocephalic and atraumatic.  Right Ear: External ear normal.  Left Ear: External ear normal.  Nose: Nose normal.  Mouth/Throat: Oropharynx is clear and moist.  Eyes: Conjunctivae and EOM are normal. Pupils are equal, round, and reactive to light. Right eye exhibits no discharge. Left eye exhibits no discharge.  Neck: Normal range of motion. Neck supple. No JVD present. No thyromegaly present.  Cardiovascular: Normal rate, regular rhythm, normal heart sounds and intact distal pulses.  Exam reveals no gallop and no friction rub.   No murmur heard. Pulmonary/Chest: Effort normal. No respiratory distress. She has wheezes. She has no rales. She exhibits no tenderness.  Abdominal: Soft. Bowel sounds are normal. She exhibits no mass. There is no tenderness. There is no guarding.  Musculoskeletal: Normal range of motion. She exhibits no edema.  Lymphadenopathy:    She has no cervical adenopathy.  Neurological: She is alert.  Skin: Skin is warm and dry. She is not diaphoretic.  Psychiatric: Mood and affect normal.  Nursing note and vitals reviewed.     Assessment & Plan  Problem List Items Addressed This Visit    None    Visit Diagnoses    Bronchiolitis    -  Primary   Relevant Medications   levofloxacin (LEVAQUIN) 500 MG tablet   albuterol (PROVENTIL HFA;VENTOLIN HFA) 108 (90 Base) MCG/ACT  inhaler   ipratropium-albuterol (DUONEB) 0.5-2.5 (3) MG/3ML nebulizer solution 3 mL   predniSONE (DELTASONE) 10 MG tablet   Other Relevant Orders   DG Chest 2 View   Moderate persistent reactive airway disease with acute exacerbation       breo sample   Relevant Medications   albuterol (PROVENTIL HFA;VENTOLIN HFA) 108 (90 Base) MCG/ACT inhaler   ipratropium-albuterol (DUONEB) 0.5-2.5 (3) MG/3ML nebulizer solution 3 mL   predniSONE (DELTASONE) 10 MG tablet   Other Relevant Orders   DG Chest 2 View        Dr. Otilio Miu [facility]  08/16/16

## 2016-08-30 ENCOUNTER — Ambulatory Visit (INDEPENDENT_AMBULATORY_CARE_PROVIDER_SITE_OTHER): Payer: BLUE CROSS/BLUE SHIELD | Admitting: Family Medicine

## 2016-08-30 VITALS — BP 130/80 | HR 80 | Ht 66.0 in | Wt 225.0 lb

## 2016-08-30 DIAGNOSIS — N750 Cyst of Bartholin's gland: Secondary | ICD-10-CM

## 2016-08-30 MED ORDER — TRAMADOL HCL 50 MG PO TABS: 50.0000 mg | ORAL_TABLET | Freq: Three times a day (TID) | ORAL | 0 refills | Status: DC | PRN

## 2016-08-30 MED ORDER — AMOXICILLIN-POT CLAVULANATE 875-125 MG PO TABS: 1.0000 | ORAL_TABLET | Freq: Two times a day (BID) | ORAL | 0 refills | Status: DC

## 2016-08-30 NOTE — Patient Instructions (Signed)
Bartholin Cyst or Abscess Introduction A Bartholin cyst is a fluid-filled sac that forms on a Bartholin gland. Bartholin glands are small glands that are located within the folds of skin (labia) along the sides of the lower opening of the vagina. These glands produce a fluid to moisten the outside of the vagina during sexual intercourse. A Bartholin cyst causes a bulge on the side of the vagina. A cyst that is not large or infected may not cause symptoms or problems. However, if the fluid within the cyst becomes infected, the cyst can turn into an abscess. An abscess may cause discomfort or pain. What are the causes? A Bartholin cyst may develop when the duct of the gland becomes blocked. In many cases, the cause of this is not known. Various kinds of bacteria can cause the cyst to become infected and develop into an abscess. What increases the risk? You may be at an increased risk of developing a Bartholin cyst or abscess if:  You are a woman of reproductive age.  You have a history of previous Bartholin cysts or abscesses.  You have diabetes.  You have a sexually transmitted disease (STD). What are the signs or symptoms? The severity of symptoms varies depending on the size of the cyst and whether it is infected. Symptoms may include:  A bulge or swelling near the lower opening of your vagina.  Discomfort or pain.  Redness.  Pain during sexual intercourse.  Pain when walking.  Fluid draining from the area. How is this diagnosed? Your health care provider may make a diagnosis based on your symptoms and a physical exam. He or she will look for swelling in your vaginal area. Blood tests may be done to check for infections. A sample of fluid from the cyst or abscess may also be taken to be tested in a lab. How is this treated? Small cysts that are not infected may not require any treatment. These often go away on their own. Yourhealth care provider will recommend hot baths and the use  of warm compresses. These may also be part of the treatment for an abscess. Treatment options for a large cyst or abscess may include:  Antibiotic medicine.  A surgical procedure to drain the abscess. One of the following procedures may be done:  Incision and drainage. An incision is made in the cyst or abscess so that the fluid drains out. A catheter may be placed inside the cyst so that it does not close and fill up with fluid again. The catheter will be removed after you have a follow-up visit with a specialist (gynecologist).  Marsupialization. The cyst or abscess is opened and kept open by stitching the edges of the skin to the walls of the cyst or abscess. This allows it to continue to drain and not fill up with fluid again. If you have cysts or abscesses that keep returning and have required incision and drainage multiple times, your health care provider may talk to you about surgery to remove the Bartholin gland. Follow these instructions at home:  Take medicines only as directed by your health care provider.  If you were prescribed an antibiotic medicine, finish it all even if you start to feel better.  Apply warm, wet compresses to the area or take warm, shallow baths that cover your pelvic region (sitz baths) several times a day or as directed by your health care provider.  Do not squeeze the cyst or apply heavy pressure to it.  Do not  have sexual intercourse until the cyst has gone away.  If your cyst or abscess was opened, a small piece of gauze or a drain may have been placed in the area to allow drainage. Do not remove the gauze or the drain until directed by your health care provider.  Wear feminine pads-not tampons-as needed for any drainage or bleeding.  Keep all follow-up visits as directed by your health care provider. This is important. How is this prevented? Take these steps to help prevent a Bartholin cyst from returning:  Practice good hygiene.  Clean your  vaginal area with mild soap and a soft cloth when you bathe.  Practice safe sex to prevent STDs. Contact a health care provider if:  You have increased pain, swelling, or redness in the area of the cyst.  Puslike drainage is coming from the cyst.  You have a fever. This information is not intended to replace advice given to you by your health care provider. Make sure you discuss any questions you have with your health care provider. Document Released: 06/21/2005 Document Revised: 11/27/2015 Document Reviewed: 02/04/2014  2017 Elsevier

## 2016-08-30 NOTE — Progress Notes (Signed)
Name: Theresa Gross   MRN: [id number]    DOB: 27-Oct-1955   Date:08/30/2016       Progress Note  Subjective  Chief Complaint  Chief Complaint  Patient presents with  . Vaginal Pain    2 bumps came up on vagina- was wearing a pad for about a month due to coughing and leaking a little when coughing. Thought it came from pad    Vaginal Pain  The patient's pertinent negatives include no genital itching, genital lesions, genital odor, genital rash, missed menses, pelvic pain, vaginal bleeding or vaginal discharge. This is a new problem. The current episode started in the past 7 days. The problem occurs constantly. The problem has been gradually worsening. The pain is moderate. The problem affects both (R>L labia) sides. She is not pregnant. Pertinent negatives include no abdominal pain, anorexia, back pain, chills, constipation, diarrhea, discolored urine, dysuria, fever, flank pain, frequency, headaches, hematuria, joint pain, joint swelling, nausea, painful intercourse, rash, sore throat, urgency or vomiting. Her past medical history is significant for a gynecological surgery. There is no history of an abdominal surgery, herpes simplex, ovarian cysts or perineal abscess.    No problem-specific Assessment & Plan notes found for this encounter.   Past Medical History:  Diagnosis Date  . Cervical cancer [facility])    age 21  . Diverticulosis   . GERD (gastroesophageal reflux disease)   . Hemorrhoids   . History of colon polyps   . Hyperlipemia   . Hypertension   . Vitamin D deficiency     Past Surgical History:  Procedure Laterality Date  . ABDOMINAL HYSTERECTOMY    . BREAST BIOPSY Right   . CHOLECYSTECTOMY    . COLONOSCOPY WITH PROPOFOL N/A 06/16/2016   Procedure: COLONOSCOPY WITH PROPOFOL;  Surgeon: Manya Silvas, MD;  Location: [facility] ENDOSCOPY;  Service: Endoscopy;  Laterality: N/A;  . DIAGNOSTIC LAPAROSCOPY    . EYE SURGERY    . GALLBLADDER SURGERY  06/04/2014    Family History   Problem Relation Age of Onset  . Colon cancer Mother     Social History   Social History  . Marital status: Married    Spouse name: N/A  . Number of children: N/A  . Years of education: N/A   Occupational History  . Not on file.   Social History Main Topics  . Smoking status: Former Research scientist (life sciences)  . Smokeless tobacco: Never Used  . Alcohol use 0.6 oz/week    1 Cans of beer per week  . Drug use: No  . Sexual activity: Yes   Other Topics Concern  . Not on file   Social History Narrative  . No narrative on file    Allergies  Allergen Reactions  . Lipitor  [Atorvastatin]   . Metronidazole   . Nsaids Other (See Comments)    Outpatient Medications Prior to Visit  Medication Sig Dispense Refill  . albuterol (PROVENTIL HFA;VENTOLIN HFA) 108 (90 Base) MCG/ACT inhaler Inhale 2 puffs into the lungs every 6 (six) hours as needed for wheezing or shortness of breath. 1 Inhaler 0  . Apremilast 30 MG TABS Take 1 tablet by mouth 2 (two) times daily. Derm    . aspirin 81 MG tablet Take 1 tablet by mouth daily.    . Cholecalciferol (VITAMIN D) 2000 units tablet Take 2,000 Units by mouth daily.    . hydrochlorothiazide (HYDRODIURIL) 12.5 MG tablet Take 1 tablet (12.5 mg total) by mouth daily. 90 tablet 1  . lansoprazole (  PREVACID) 30 MG capsule Take 1 capsule by mouth daily. GI Doc    . mesalamine (LIALDA) 1.2 g EC tablet Take 2 tablets by mouth daily. GI Doc    . nystatin-triamcinolone (MYCOLOG II) cream Apply 1 application topically 2 (two) times daily. PRN/ Derm    . levofloxacin (LEVAQUIN) 500 MG tablet Take 1 tablet (500 mg total) by mouth daily. 7 tablet 0  . predniSONE (DELTASONE) 10 MG tablet Take 1 tablet (10 mg total) by mouth daily with breakfast. 14 tablet 0   Facility-Administered Medications Prior to Visit  Medication Dose Route Frequency Provider Last Rate Last Dose  . ipratropium-albuterol (DUONEB) 0.5-2.5 (3) MG/3ML nebulizer solution 3 mL  3 mL Nebulization Once Juline Patch, MD        Review of Systems  Constitutional: Negative for chills, fever, malaise/fatigue and weight loss.  HENT: Negative for ear discharge, ear pain and sore throat.   Eyes: Negative for blurred vision.  Respiratory: Negative for cough, sputum production, shortness of breath and wheezing.   Cardiovascular: Negative for chest pain, palpitations and leg swelling.  Gastrointestinal: Negative for abdominal pain, anorexia, blood in stool, constipation, diarrhea, heartburn, melena, nausea and vomiting.  Genitourinary: Positive for vaginal pain. Negative for dysuria, flank pain, frequency, hematuria, missed menses, pelvic pain, urgency and vaginal discharge.  Musculoskeletal: Negative for back pain, joint pain, myalgias and neck pain.  Skin: Negative for rash.  Neurological: Negative for dizziness, tingling, sensory change, focal weakness and headaches.  Endo/Heme/Allergies: Negative for environmental allergies and polydipsia. Does not bruise/bleed easily.  Psychiatric/Behavioral: Negative for depression and suicidal ideas. The patient is not nervous/anxious and does not have insomnia.      Objective  Vitals:   08/30/16 1347  BP: 130/80  Pulse: 80  Weight: 225 lb (102.1 kg)  Height: 5\' 6"  (1.676 m)    Physical Exam  Constitutional: She is well-developed, well-nourished, and in no distress. No distress.  HENT:  Head: Normocephalic and atraumatic.  Right Ear: External ear normal.  Left Ear: External ear normal.  Nose: Nose normal.  Mouth/Throat: Oropharynx is clear and moist.  Eyes: Conjunctivae and EOM are normal. Pupils are equal, round, and reactive to light. Right eye exhibits no discharge. Left eye exhibits no discharge.  Neck: Normal range of motion. Neck supple. No JVD present. No thyromegaly present.  Cardiovascular: Normal rate, regular rhythm, normal heart sounds and intact distal pulses.  Exam reveals no gallop and no friction rub.   No murmur  heard. Pulmonary/Chest: Effort normal and breath sounds normal. She has no wheezes. She has no rales.  Abdominal: Soft. Bowel sounds are normal. She exhibits no mass. There is no tenderness. There is no guarding.  Genitourinary: Vulva exhibits erythema and tenderness.  Genitourinary Comments: No ulceration/firm flutulent mass/cust right vulva/superior aspectof right vulva  Musculoskeletal: Normal range of motion. She exhibits no edema.  Lymphadenopathy:    She has no cervical adenopathy.  Neurological: She is alert. She has normal reflexes.  Skin: Skin is warm and dry. She is not diaphoretic.  Psychiatric: Mood and affect normal.  Nursing note and vitals reviewed.     Assessment & Plan  Problem List Items Addressed This Visit    None    Visit Diagnoses    Infected cyst of Bartholin's gland duct    -  Primary   may be abcess rather than bartholin   Relevant Medications   traMADol (ULTRAM) 50 MG tablet   Other Relevant Orders   Ambulatory  referral to Gynecology      Meds ordered this encounter  Medications  . amoxicillin-clavulanate (AUGMENTIN) 875-125 MG tablet    Sig: Take 1 tablet by mouth 2 (two) times daily.    Dispense:  20 tablet    Refill:  0  . traMADol (ULTRAM) 50 MG tablet    Sig: Take 1 tablet (50 mg total) by mouth every 8 (eight) hours as needed.    Dispense:  30 tablet    Refill:  0      Dr. Cadience [facility]  08/30/16

## 2016-08-31 ENCOUNTER — Encounter: Payer: Self-pay | Admitting: Obstetrics and Gynecology

## 2016-08-31 ENCOUNTER — Ambulatory Visit (INDEPENDENT_AMBULATORY_CARE_PROVIDER_SITE_OTHER): Payer: BLUE CROSS/BLUE SHIELD | Admitting: Obstetrics and Gynecology

## 2016-08-31 VITALS — BP 145/86 | HR 89 | Ht 66.0 in | Wt 225.4 lb

## 2016-08-31 DIAGNOSIS — N764 Abscess of vulva: Secondary | ICD-10-CM

## 2016-08-31 MED ORDER — METRONIDAZOLE 500 MG PO TABS: 500.0000 mg | ORAL_TABLET | Freq: Two times a day (BID) | ORAL | 0 refills | Status: AC

## 2016-08-31 NOTE — Progress Notes (Signed)
HPI:      [name] is a 61 y.o. G1P1001 who LMP was No LMP recorded. Patient has had a hysterectomy.  Subjective:   She presents today As a referral from Dr. Ronnald Ramp for labial abscess. The patient was sick with a constant cough last month and for a pad continuously to protect against urine leakage. Last Thursday she began having painful swelling of her right labia. This has progressed to include the left labia and mons pubis. She states that today the right labial area started to drain. She says she has never had anything like this before. She was begun on Augmentin by Dr. Ronnald Ramp yesterday.    Hx: The following portions of the patient's history were reviewed and updated as appropriate:              She  has a past medical history of Cervical cancer ([place]); Diverticulosis; GERD (gastroesophageal reflux disease); Hemorrhoids; History of colon polyps; Hyperlipemia; Hypertension; and Vitamin D deficiency. She  does not have any pertinent problems on file. She  has a past surgical history that includes Gallbladder surgery (06/04/2014); Breast biopsy (Right); Cholecystectomy; Eye surgery; Abdominal hysterectomy; Diagnostic laparoscopy; and Colonoscopy with propofol (N/A, 06/16/2016). Her family history includes Colon cancer in her mother. She  reports that she has quit smoking. She has never used smokeless tobacco. She reports that she drinks about 0.6 oz of alcohol per week . She reports that she does not use drugs. Current Outpatient Prescriptions on File Prior to Visit  Medication Sig Dispense Refill  . albuterol (PROVENTIL HFA;VENTOLIN HFA) 108 (90 Base) MCG/ACT inhaler Inhale 2 puffs into the lungs every 6 (six) hours as needed for wheezing or shortness of breath. 1 Inhaler 0  . amoxicillin-clavulanate (AUGMENTIN) 875-125 MG tablet Take 1 tablet by mouth 2 (two) times daily. 20 tablet 0  . Apremilast 30 MG TABS Take 1 tablet by mouth 2 (two) times daily. Derm    . aspirin 81 MG tablet Take  1 tablet by mouth daily.    . Cholecalciferol (VITAMIN D) 2000 units tablet Take 2,000 Units by mouth daily.    . hydrochlorothiazide (HYDRODIURIL) 12.5 MG tablet Take 1 tablet (12.5 mg total) by mouth daily. 90 tablet 1  . lansoprazole (PREVACID) 30 MG capsule Take 1 capsule by mouth daily. GI Doc    . mesalamine (LIALDA) 1.2 g EC tablet Take 2 tablets by mouth daily. GI Doc    . nystatin-triamcinolone (MYCOLOG II) cream Apply 1 application topically 2 (two) times daily. PRN/ Derm    . traMADol (ULTRAM) 50 MG tablet Take 1 tablet (50 mg total) by mouth every 8 (eight) hours as needed. 30 tablet 0   Current Facility-Administered Medications on File Prior to Visit  Medication Dose Route Frequency Provider Last Rate Last Dose  . ipratropium-albuterol (DUONEB) 0.5-2.5 (3) MG/3ML nebulizer solution 3 mL  3 mL Nebulization Once Juline Patch, MD             Review of Systems:  Review of Systems  Constitutional: Denied constitutional symptoms, night sweats, recent illness, fatigue, fever, insomnia and weight loss.  Eyes: Denied eye symptoms, eye pain, photophobia, vision change and visual disturbance.  Ears/Nose/Throat/Neck: Denied ear, nose, throat or neck symptoms, hearing loss, nasal discharge, sinus congestion and sore throat.  Cardiovascular: Denied cardiovascular symptoms, arrhythmia, chest pain/pressure, edema, exercise intolerance, orthopnea and palpitations.  Respiratory: Denied pulmonary symptoms, asthma, pleuritic pain, productive sputum, cough, dyspnea and wheezing.  Gastrointestinal: Denied, gastro-esophageal reflux,  melena, nausea and vomiting.  Genitourinary: See HPI for additional information.  Musculoskeletal: Denied musculoskeletal symptoms, stiffness, swelling, muscle weakness and myalgia.  Dermatologic: Denied dermatology symptoms, rash and scar.  Neurologic: Denied neurology symptoms, dizziness, headache, neck pain and syncope.  Psychiatric: Denied psychiatric symptoms,  anxiety and depression.  Endocrine: Denied endocrine symptoms including hot flashes and night sweats.   Meds:   Current Outpatient Prescriptions on File Prior to Visit  Medication Sig Dispense Refill  . albuterol (PROVENTIL HFA;VENTOLIN HFA) 108 (90 Base) MCG/ACT inhaler Inhale 2 puffs into the lungs every 6 (six) hours as needed for wheezing or shortness of breath. 1 Inhaler 0  . amoxicillin-clavulanate (AUGMENTIN) 875-125 MG tablet Take 1 tablet by mouth 2 (two) times daily. 20 tablet 0  . Apremilast 30 MG TABS Take 1 tablet by mouth 2 (two) times daily. Derm    . aspirin 81 MG tablet Take 1 tablet by mouth daily.    . Cholecalciferol (VITAMIN D) 2000 units tablet Take 2,000 Units by mouth daily.    . hydrochlorothiazide (HYDRODIURIL) 12.5 MG tablet Take 1 tablet (12.5 mg total) by mouth daily. 90 tablet 1  . lansoprazole (PREVACID) 30 MG capsule Take 1 capsule by mouth daily. GI Doc    . mesalamine (LIALDA) 1.2 g EC tablet Take 2 tablets by mouth daily. GI Doc    . nystatin-triamcinolone (MYCOLOG II) cream Apply 1 application topically 2 (two) times daily. PRN/ Derm    . traMADol (ULTRAM) 50 MG tablet Take 1 tablet (50 mg total) by mouth every 8 (eight) hours as needed. 30 tablet 0   Current Facility-Administered Medications on File Prior to Visit  Medication Dose Route Frequency Provider Last Rate Last Dose  . ipratropium-albuterol (DUONEB) 0.5-2.5 (3) MG/3ML nebulizer solution 3 mL  3 mL Nebulization Once Juline Patch, MD        Objective:     Vitals:   08/31/16 0959  BP: (!) 145/86  Pulse: 89              Physical examination   Pelvic:   Vulva: Draining abscess and significant edema of the right labia. Left labial abscess approximately 2 cm in size. Small abscess on mons crusted over and drainage completed.   Vagina: No lesions or abnormalities noted.  Support: Normal pelvic support.  Urethra No masses tenderness or scarring.  Meatus Normal size without lesions or  prolapse.  Cervix: Surgically absent   Anus: Normal exam.  No lesions.  Perineum: Normal exam.  No lesions.        Bimanual   Uterus: Surgically absent   Adnexae: No masses.  Non-tender to palpation.  Cul-de-sac: Negative for abnormality.   The left abscess was injected with a small needle and punctured and systematically drained. Cultures were performed. As the right labial abscess was already draining and had significant erythema no attempt at further drainage was performed.   Assessment:    G1P1001 Patient Active Problem List   Diagnosis Date Noted  . Aortic atherosclerosis ([place]) 05/25/2016  . Steatosis of liver 05/25/2016  . Mixed hyperlipidemia 05/25/2016  . Essential (primary) hypertension 10/28/2014  . Gastro-esophageal reflux disease without esophagitis 10/28/2014  . H/O psoriasis 10/28/2014  . Decreased potassium in the blood 10/28/2014  . Familial multiple lipoprotein-type hyperlipidemia 10/28/2014     1. Abscess of right genital labia   2. Abscess of left genital labia     These abscesses do not represent Bartholin's gland abscess. These likely started as skin  structure infections followed by abscess formation secondary to patient wearing a pad constantly. Augmentin is a good choice for skin structure infections. I have added Flagyl to slightly increase the gram-negative coverage. Have recommended 2 times per day sitz baths with use of warm wet soaks to encourage continued drainage of both right and labial lesions.   Plan:            1.   I have added Flagyl to slightly increase the gram-negative coverage. Have recommended 2 times per day sitz baths with use of warm wet soaks to encourage continued drainage of both right and labial lesions. If lesions become worse strong consideration toward surgical drainage.     Meds ordered this encounter  Medications  . metroNIDAZOLE (FLAGYL) 500 MG tablet    Sig: Take 1 tablet (500 mg total) by mouth 2 (two) times daily.     Dispense:  28 tablet    Refill:  0        F/U  Return in about 6 days (around 09/06/2016).  Finis Bud, M.D. 08/31/2016 11:19 AM

## 2016-08-31 NOTE — Addendum Note (Signed)
Addended by: Raliegh Ip on: 08/31/2016 11:37 AM   Modules accepted: Orders

## 2016-09-06 ENCOUNTER — Ambulatory Visit (INDEPENDENT_AMBULATORY_CARE_PROVIDER_SITE_OTHER): Payer: BLUE CROSS/BLUE SHIELD | Admitting: Obstetrics and Gynecology

## 2016-09-06 ENCOUNTER — Other Ambulatory Visit: Payer: Self-pay

## 2016-09-06 ENCOUNTER — Encounter: Payer: Self-pay | Admitting: Obstetrics and Gynecology

## 2016-09-06 VITALS — BP 123/81 | HR 81 | Wt 226.5 lb

## 2016-09-06 DIAGNOSIS — Z22322 Carrier or suspected carrier of Methicillin resistant Staphylococcus aureus: Secondary | ICD-10-CM

## 2016-09-06 DIAGNOSIS — N764 Abscess of vulva: Secondary | ICD-10-CM

## 2016-09-06 LAB — ANAEROBIC AND AEROBIC CULTURE

## 2016-09-06 MED ORDER — AMOXICILLIN-POT CLAVULANATE 875-125 MG PO TABS: 1.0000 | ORAL_TABLET | Freq: Two times a day (BID) | ORAL | 0 refills | Status: DC

## 2016-09-06 MED ORDER — SULFAMETHOXAZOLE-TRIMETHOPRIM 800-160 MG PO TABS: 1.0000 | ORAL_TABLET | Freq: Two times a day (BID) | ORAL | 0 refills | Status: AC

## 2016-09-06 NOTE — Telephone Encounter (Signed)
-----   Message from Harlin Heys, MD sent at 09/06/2016  3:55 PM EST ----- MRSA noted.  Change ABX to Bactrim DS  .  1 PO BID for 10 days - total 20.  Pt may stop all other antibiotics.

## 2016-09-06 NOTE — Progress Notes (Signed)
HPI:      [name] is a 61 y.o. G1P1001 who LMP was No LMP recorded. Patient has had a hysterectomy.  Subjective:   She presents today For follow-up of labial abscesses. She has been taking antibiotics and the right labial abscess was draining spontaneously and I opened and drained the left at her last office visit. She has been doing sitz baths and warm compresses. She reports that she is much better. She has a history of cervical cancer and is status post hysterectomy.    Hx: The following portions of the patient's history were reviewed and updated as appropriate:              She  has a past medical history of Cervical cancer ([place]); Diverticulosis; GERD (gastroesophageal reflux disease); Hemorrhoids; History of colon polyps; Hyperlipemia; Hypertension; and Vitamin D deficiency. She  does not have any pertinent problems on file. She  has a past surgical history that includes Gallbladder surgery (06/04/2014); Breast biopsy (Right); Cholecystectomy; Eye surgery; Abdominal hysterectomy; Diagnostic laparoscopy; and Colonoscopy with propofol (N/A, 06/16/2016). Her family history includes Colon cancer in her mother. She  reports that she has quit smoking. She has never used smokeless tobacco. She reports that she drinks about 0.6 oz of alcohol per week . She reports that she does not use drugs. Current Outpatient Prescriptions on File Prior to Visit  Medication Sig Dispense Refill  . albuterol (PROVENTIL HFA;VENTOLIN HFA) 108 (90 Base) MCG/ACT inhaler Inhale 2 puffs into the lungs every 6 (six) hours as needed for wheezing or shortness of breath. 1 Inhaler 0  . Apremilast 30 MG TABS Take 1 tablet by mouth 2 (two) times daily. Derm    . aspirin 81 MG tablet Take 1 tablet by mouth daily.    . Cholecalciferol (VITAMIN D) 2000 units tablet Take 2,000 Units by mouth daily.    . hydrochlorothiazide (HYDRODIURIL) 12.5 MG tablet Take 1 tablet (12.5 mg total) by mouth daily. 90 tablet 1  .  lansoprazole (PREVACID) 30 MG capsule Take 1 capsule by mouth daily. GI Doc    . mesalamine (LIALDA) 1.2 g EC tablet Take 2 tablets by mouth daily. GI Doc    . metroNIDAZOLE (FLAGYL) 500 MG tablet Take 1 tablet (500 mg total) by mouth 2 (two) times daily. 28 tablet 0  . nystatin-triamcinolone (MYCOLOG II) cream Apply 1 application topically 2 (two) times daily. PRN/ Derm    . traMADol (ULTRAM) 50 MG tablet Take 1 tablet (50 mg total) by mouth every 8 (eight) hours as needed. (Patient not taking: Reported on 09/06/2016) 30 tablet 0   Current Facility-Administered Medications on File Prior to Visit  Medication Dose Route Frequency Provider Last Rate Last Dose  . ipratropium-albuterol (DUONEB) 0.5-2.5 (3) MG/3ML nebulizer solution 3 mL  3 mL Nebulization Once Juline Patch, MD             Review of Systems:  Review of Systems  Constitutional: Denied constitutional symptoms, night sweats, recent illness, fatigue, fever, insomnia and weight loss.  Eyes: Denied eye symptoms, eye pain, photophobia, vision change and visual disturbance.  Ears/Nose/Throat/Neck: Denied ear, nose, throat or neck symptoms, hearing loss, nasal discharge, sinus congestion and sore throat.  Cardiovascular: Denied cardiovascular symptoms, arrhythmia, chest pain/pressure, edema, exercise intolerance, orthopnea and palpitations.  Respiratory: Denied pulmonary symptoms, asthma, pleuritic pain, productive sputum, cough, dyspnea and wheezing.  Gastrointestinal: Denied, gastro-esophageal reflux, melena, nausea and vomiting.  Genitourinary: See HPI for additional information.  Musculoskeletal: Denied  musculoskeletal symptoms, stiffness, swelling, muscle weakness and myalgia.  Dermatologic: Denied dermatology symptoms, rash and scar.  Neurologic: Denied neurology symptoms, dizziness, headache, neck pain and syncope.  Psychiatric: Denied psychiatric symptoms, anxiety and depression.  Endocrine: Denied endocrine symptoms including hot  flashes and night sweats.   Meds:   Current Outpatient Prescriptions on File Prior to Visit  Medication Sig Dispense Refill  . albuterol (PROVENTIL HFA;VENTOLIN HFA) 108 (90 Base) MCG/ACT inhaler Inhale 2 puffs into the lungs every 6 (six) hours as needed for wheezing or shortness of breath. 1 Inhaler 0  . Apremilast 30 MG TABS Take 1 tablet by mouth 2 (two) times daily. Derm    . aspirin 81 MG tablet Take 1 tablet by mouth daily.    . Cholecalciferol (VITAMIN D) 2000 units tablet Take 2,000 Units by mouth daily.    . hydrochlorothiazide (HYDRODIURIL) 12.5 MG tablet Take 1 tablet (12.5 mg total) by mouth daily. 90 tablet 1  . lansoprazole (PREVACID) 30 MG capsule Take 1 capsule by mouth daily. GI Doc    . mesalamine (LIALDA) 1.2 g EC tablet Take 2 tablets by mouth daily. GI Doc    . metroNIDAZOLE (FLAGYL) 500 MG tablet Take 1 tablet (500 mg total) by mouth 2 (two) times daily. 28 tablet 0  . nystatin-triamcinolone (MYCOLOG II) cream Apply 1 application topically 2 (two) times daily. PRN/ Derm    . traMADol (ULTRAM) 50 MG tablet Take 1 tablet (50 mg total) by mouth every 8 (eight) hours as needed. (Patient not taking: Reported on 09/06/2016) 30 tablet 0   Current Facility-Administered Medications on File Prior to Visit  Medication Dose Route Frequency Provider Last Rate Last Dose  . ipratropium-albuterol (DUONEB) 0.5-2.5 (3) MG/3ML nebulizer solution 3 mL  3 mL Nebulization Once Juline Patch, MD        Objective:     Vitals:   09/06/16 0951  BP: 123/81  Pulse: 81              Physical examination   Pelvic:   Vulva: Complete resolution of the mons abscess and left labial abscess. 80% resolution of the right labial abscess which continues to have some induration and minor drainage.   Vagina: No lesions or abnormalities noted.  Support: Normal pelvic support.  Urethra No masses tenderness or scarring.  Meatus Normal size without lesions or prolapse.  Cervix: Surgically absent    Anus: Normal exam.  No lesions.  Perineum: Normal exam.  No lesions.        Bimanual   Uterus: Surgically absent     Assessment:    G1P1001 Patient Active Problem List   Diagnosis Date Noted  . Aortic atherosclerosis ([place]) 05/25/2016  . Steatosis of liver 05/25/2016  . Mixed hyperlipidemia 05/25/2016  . Essential (primary) hypertension 10/28/2014  . Gastro-esophageal reflux disease without esophagitis 10/28/2014  . H/O psoriasis 10/28/2014  . Decreased potassium in the blood 10/28/2014  . Familial multiple lipoprotein-type hyperlipidemia 10/28/2014     1. Abscess of right genital labia   2. Abscess of left genital labia     Significant improvement. 80% resolution with antibiotics and drainage.   Plan:            1.  Continue antibiotics for another week. Continue warm compresses and sitz baths.   Meds ordered this encounter  Medications  . amoxicillin-clavulanate (AUGMENTIN) 875-125 MG tablet    Sig: Take 1 tablet by mouth 2 (two) times daily.    Dispense:  14  tablet    Refill:  0        F/U  Return in about 3 weeks (around 09/27/2016).  Finis Bud, M.D. 09/06/2016 10:25 AM

## 2016-09-06 NOTE — Telephone Encounter (Signed)
Spoke with patient- providers instructions given with understanding. Antibiotic sent to pharmacy on file.

## 2016-09-28 ENCOUNTER — Ambulatory Visit (INDEPENDENT_AMBULATORY_CARE_PROVIDER_SITE_OTHER): Payer: BLUE CROSS/BLUE SHIELD | Admitting: Obstetrics and Gynecology

## 2016-09-28 ENCOUNTER — Encounter: Payer: Self-pay | Admitting: Obstetrics and Gynecology

## 2016-09-28 ENCOUNTER — Other Ambulatory Visit: Payer: Self-pay | Admitting: Obstetrics and Gynecology

## 2016-09-28 VITALS — BP 144/84 | HR 72 | Wt 230.4 lb

## 2016-09-28 DIAGNOSIS — Z1231 Encounter for screening mammogram for malignant neoplasm of breast: Secondary | ICD-10-CM

## 2016-09-28 DIAGNOSIS — B3731 Acute candidiasis of vulva and vagina: Secondary | ICD-10-CM

## 2016-09-28 DIAGNOSIS — B373 Candidiasis of vulva and vagina: Secondary | ICD-10-CM

## 2016-09-28 DIAGNOSIS — Z8541 Personal history of malignant neoplasm of cervix uteri: Secondary | ICD-10-CM

## 2016-09-28 DIAGNOSIS — Z0000 Encounter for general adult medical examination without abnormal findings: Secondary | ICD-10-CM

## 2016-09-28 MED ORDER — FLUCONAZOLE 150 MG PO TABS: 150.0000 mg | ORAL_TABLET | ORAL | 0 refills | Status: DC

## 2016-09-28 NOTE — Progress Notes (Signed)
HPI:      Theresa Gross is a 61 y.o. G1P1001 who LMP was No LMP recorded. Patient has had a hysterectomy.  Subjective:   She presents today for her annual examination.  She has completely recovered from MRSA and has completed her antibiotics. She does state that she has some vulvar itching since the antibiotics. Of significant note she has a history of cervical cancer.    Hx: The following portions of the patient's history were reviewed and updated as appropriate:              She  has a past medical history of Cervical cancer ([place]); Diverticulosis; GERD (gastroesophageal reflux disease); Hemorrhoids; History of colon polyps; Hyperlipemia; Hypertension; and Vitamin D deficiency. She  does not have any pertinent problems on file. She  has a past surgical history that includes Gallbladder surgery (06/04/2014); Breast biopsy (Right); Cholecystectomy; Eye surgery; Abdominal hysterectomy; Diagnostic laparoscopy; and Colonoscopy with propofol (N/A, 06/16/2016). Her family history includes Colon cancer in her mother. She  reports that she has quit smoking. She has never used smokeless tobacco. She reports that she drinks about 0.6 oz of alcohol per week . She reports that she does not use drugs. Current Outpatient Prescriptions on File Prior to Visit  Medication Sig Dispense Refill  . albuterol (PROVENTIL HFA;VENTOLIN HFA) 108 (90 Base) MCG/ACT inhaler Inhale 2 puffs into the lungs every 6 (six) hours as needed for wheezing or shortness of breath. 1 Inhaler 0  . aspirin 81 MG tablet Take 1 tablet by mouth daily.    . Cholecalciferol (VITAMIN D) 2000 units tablet Take 2,000 Units by mouth daily.    . hydrochlorothiazide (HYDRODIURIL) 12.5 MG tablet Take 1 tablet (12.5 mg total) by mouth daily. 90 tablet 1  . lansoprazole (PREVACID) 30 MG capsule Take 1 capsule by mouth daily. GI Doc    . mesalamine (LIALDA) 1.2 g EC tablet Take 2 tablets by mouth daily. GI Doc    . nystatin-triamcinolone  (MYCOLOG II) cream Apply 1 application topically 2 (two) times daily. PRN/ Derm    . amoxicillin-clavulanate (AUGMENTIN) 875-125 MG tablet Take 1 tablet by mouth 2 (two) times daily. (Patient not taking: Reported on 09/28/2016) 14 tablet 0  . Apremilast 30 MG TABS Take 1 tablet by mouth 2 (two) times daily. Derm    . traMADol (ULTRAM) 50 MG tablet Take 1 tablet (50 mg total) by mouth every 8 (eight) hours as needed. (Patient not taking: Reported on 09/06/2016) 30 tablet 0   Current Facility-Administered Medications on File Prior to Visit  Medication Dose Route Frequency Provider Last Rate Last Dose  . ipratropium-albuterol (DUONEB) 0.5-2.5 (3) MG/3ML nebulizer solution 3 mL  3 mL Nebulization Once Juline Patch, MD             Review of Systems:  Review of Systems  Constitutional: Denied constitutional symptoms, night sweats, recent illness, fatigue, fever, insomnia and weight loss.  Eyes: Denied eye symptoms, eye pain, photophobia, vision change and visual disturbance.  Ears/Nose/Throat/Neck: Denied ear, nose, throat or neck symptoms, hearing loss, nasal discharge, sinus congestion and sore throat.  Cardiovascular: Denied cardiovascular symptoms, arrhythmia, chest pain/pressure, edema, exercise intolerance, orthopnea and palpitations.  Respiratory: Denied pulmonary symptoms, asthma, pleuritic pain, productive sputum, cough, dyspnea and wheezing.  Gastrointestinal: Denied, gastro-esophageal reflux, melena, nausea and vomiting.  Genitourinary: Vulvar itching  Musculoskeletal: Denied musculoskeletal symptoms, stiffness, swelling, muscle weakness and myalgia.  Dermatologic: Denied dermatology symptoms, rash and scar.  Neurologic: Denied neurology  symptoms, dizziness, headache, neck pain and syncope.  Psychiatric: Denied psychiatric symptoms, anxiety and depression.  Endocrine: Denied endocrine symptoms including hot flashes and night sweats.   Meds:   Current Outpatient Prescriptions on File  Prior to Visit  Medication Sig Dispense Refill  . albuterol (PROVENTIL HFA;VENTOLIN HFA) 108 (90 Base) MCG/ACT inhaler Inhale 2 puffs into the lungs every 6 (six) hours as needed for wheezing or shortness of breath. 1 Inhaler 0  . aspirin 81 MG tablet Take 1 tablet by mouth daily.    . Cholecalciferol (VITAMIN D) 2000 units tablet Take 2,000 Units by mouth daily.    . hydrochlorothiazide (HYDRODIURIL) 12.5 MG tablet Take 1 tablet (12.5 mg total) by mouth daily. 90 tablet 1  . lansoprazole (PREVACID) 30 MG capsule Take 1 capsule by mouth daily. GI Doc    . mesalamine (LIALDA) 1.2 g EC tablet Take 2 tablets by mouth daily. GI Doc    . nystatin-triamcinolone (MYCOLOG II) cream Apply 1 application topically 2 (two) times daily. PRN/ Derm    . amoxicillin-clavulanate (AUGMENTIN) 875-125 MG tablet Take 1 tablet by mouth 2 (two) times daily. (Patient not taking: Reported on 09/28/2016) 14 tablet 0  . Apremilast 30 MG TABS Take 1 tablet by mouth 2 (two) times daily. Derm    . traMADol (ULTRAM) 50 MG tablet Take 1 tablet (50 mg total) by mouth every 8 (eight) hours as needed. (Patient not taking: Reported on 09/06/2016) 30 tablet 0   Current Facility-Administered Medications on File Prior to Visit  Medication Dose Route Frequency Provider Last Rate Last Dose  . ipratropium-albuterol (DUONEB) 0.5-2.5 (3) MG/3ML nebulizer solution 3 mL  3 mL Nebulization Once Juline Patch, MD        Objective:     Vitals:   09/28/16 0933  BP: (!) 144/84  Pulse: 72              Physical examination General NAD, Conversant  HEENT Atraumatic; Op clear with mmm.  Normo-cephalic. Pupils reactive. Anicteric sclerae  Thyroid/Neck Smooth without nodularity or enlargement. Normal ROM.  Neck Supple.  Skin No rashes, lesions or ulceration. Normal palpated skin turgor. No nodularity.  Breasts: No masses or discharge.  Symmetric.  No axillary adenopathy.  Lungs: Clear to auscultation.No rales or wheezes. Normal Respiratory  effort, no retractions.  Heart: NSR.  No murmurs or rubs appreciated. No periferal edema  Abdomen: Soft.  Non-tender.  No masses.  No HSM. No hernia  Extremities: Moves all appropriately.  Normal ROM for age. No lymphadenopathy.  Neuro: Oriented to PPT.  Normal mood. Normal affect.     Pelvic:   Vulva: Normal appearance.  No lesions. Erythematous-consistent with monilia   Vagina: No lesions or abnormalities noted.  Support: Normal pelvic support.  Urethra No masses tenderness or scarring.  Meatus Normal size without lesions or prolapse.  Cervix: Surgically absent   Anus: Normal exam.  No lesions.  Perineum: Normal exam.  No lesions.        Bimanual   Uterus: Surgically absent   Adnexae: No masses.  Non-tender to palpation.  Cul-de-sac: Negative for abnormality.     Assessment:    G1P1001 Patient Active Problem List   Diagnosis Date Noted  . Aortic atherosclerosis ([place]) 05/25/2016  . Steatosis of liver 05/25/2016  . Mixed hyperlipidemia 05/25/2016  . Essential (primary) hypertension 10/28/2014  . Gastro-esophageal reflux disease without esophagitis 10/28/2014  . H/O psoriasis 10/28/2014  . Decreased potassium in the blood 10/28/2014  . Familial  multiple lipoprotein-type hyperlipidemia 10/28/2014     1. Encounter for annual physical exam   2. Monilial vulvovaginitis   3. History of cervical cancer     MRSA resolved.  Plan:            1.  Basic Screening Recommendations The basic screening recommendations for asymptomatic women were discussed with the patient during her visit.  The age-appropriate recommendations were discussed with her and the rational for the tests reviewed.  When I am informed by the patient that another primary care physician has previously obtained the age-appropriate tests and they are up-to-date, only outstanding tests are ordered and referrals given as necessary.  Abnormal results of tests will be discussed with her when all of her results are  completed. Vaginal cuff Pap performed - mammogram ordered.  2.  Diflucan for monilia   Orders No orders of the defined types were placed in this encounter.    Meds ordered this encounter  Medications  . fluconazole (DIFLUCAN) 150 MG tablet    Sig: Take 1 tablet (150 mg total) by mouth once a week. For 2 doses    Dispense:  2 tablet    Refill:  0        F/U  Return for Annual Physical.  Finis Bud, M.D. 09/28/2016 11:24 AM

## 2016-09-30 ENCOUNTER — Telehealth: Payer: Self-pay

## 2016-09-30 LAB — PAP IG AND HPV HIGH-RISK
HPV, HIGH-RISK: NEGATIVE
PAP Smear Comment: 0

## 2016-09-30 NOTE — Telephone Encounter (Signed)
Pt informed of neg results per provider

## 2016-09-30 NOTE — Telephone Encounter (Signed)
-----   Message from Harlin Heys, MD sent at 09/30/2016  9:22 AM EDT ----- Negative Pap and HPV

## 2016-10-20 ENCOUNTER — Encounter: Payer: Self-pay | Admitting: *Deleted

## 2016-10-20 ENCOUNTER — Ambulatory Visit
Admission: EM | Admit: 2016-10-20 | Discharge: 2016-10-20 | Disposition: A | Discharge status: Home / Self Care | Payer: BLUE CROSS/BLUE SHIELD | Attending: Family Medicine | Admitting: Family Medicine

## 2016-10-20 DIAGNOSIS — F418 Other specified anxiety disorders: Secondary | ICD-10-CM | POA: Diagnosis not present

## 2016-10-20 DIAGNOSIS — R0602 Shortness of breath: Secondary | ICD-10-CM | POA: Diagnosis not present

## 2016-10-20 DIAGNOSIS — I10 Essential (primary) hypertension: Secondary | ICD-10-CM | POA: Diagnosis not present

## 2016-10-20 DIAGNOSIS — R079 Chest pain, unspecified: Secondary | ICD-10-CM | POA: Diagnosis not present

## 2016-10-20 DIAGNOSIS — H8110 Benign paroxysmal vertigo, unspecified ear: Secondary | ICD-10-CM

## 2016-10-20 MED ORDER — CLONIDINE HCL 0.2 MG PO TABS
0.2000 mg | ORAL_TABLET | Freq: Once | ORAL | Status: AC
  Administered 2016-10-20: 0.2 mg via ORAL

## 2016-10-20 NOTE — ED Triage Notes (Signed)
Pt has had episodes of "chest tightness" over past 2 days. With episodes pt has mild diaphoresis, dyspnea, and nausea. Also intermittent dizziness, pt has past hx of vertigo. Death of family member occurred yesterday.

## 2016-10-20 NOTE — ED Provider Notes (Signed)
[facility]    CSN: [id number] Arrival date & time: 10/20/16  1633     History   Chief Complaint Chief Complaint  Patient presents with  . Chest Pain  . Shortness of Breath  . Dizziness    HPI Theresa Gross is a 61 y.o. female.   61 yo female with a c/o dizziness/vertigo over the past 2 days. States has been under a lot of stress and has been feeling some sharp chest discomfort episodes. States has been anxious due to stressors and now these new symptoms. Denies jaw pian or left arm pain, one-sided weakness, or numbness.    The history is provided by the patient.  Chest Pain  Associated symptoms: dizziness and shortness of breath   Shortness of Breath  Associated symptoms: chest pain   Dizziness  Associated symptoms: chest pain and shortness of breath     Past Medical History:  Diagnosis Date  . Cervical cancer [facility]    age 25  . Diverticulosis   . GERD (gastroesophageal reflux disease)   . Hemorrhoids   . History of colon polyps   . Hyperlipemia   . Hypertension   . Vitamin D deficiency     Patient Active Problem List   Diagnosis Date Noted  . Aortic atherosclerosis ([place]) 05/25/2016  . Steatosis of liver 05/25/2016  . Mixed hyperlipidemia 05/25/2016  . Essential (primary) hypertension 10/28/2014  . Gastro-esophageal reflux disease without esophagitis 10/28/2014  . H/O psoriasis 10/28/2014  . Decreased potassium in the blood 10/28/2014  . Familial multiple lipoprotein-type hyperlipidemia 10/28/2014    Past Surgical History:  Procedure Laterality Date  . ABDOMINAL HYSTERECTOMY    . BREAST BIOPSY Right   . CHOLECYSTECTOMY    . COLONOSCOPY WITH PROPOFOL N/A 06/16/2016   Procedure: COLONOSCOPY WITH PROPOFOL;  Surgeon: Manya Silvas, MD;  Location: [facility] ENDOSCOPY;  Service: Endoscopy;  Laterality: N/A;  . DIAGNOSTIC LAPAROSCOPY    . EYE SURGERY    . GALLBLADDER SURGERY  06/04/2014    OB History    Gravida Para Term Preterm AB Living   1 1 1     1    SAB TAB Ectopic Multiple Live Births           1       Home Medications    Prior to Admission medications   Medication Sig Start Date End Date Taking? Authorizing Provider  albuterol (PROVENTIL HFA;VENTOLIN HFA) 108 (90 Base) MCG/ACT inhaler Inhale 2 puffs into the lungs every 6 (six) hours as needed for wheezing or shortness of breath. 08/16/16  Yes Juline Patch, MD  Apremilast 30 MG TABS Take 1 tablet by mouth 2 (two) times daily. Derm   Yes Historical Provider, MD  aspirin 81 MG tablet Take 1 tablet by mouth daily.   Yes Historical Provider, MD  Cholecalciferol (VITAMIN D) 2000 units tablet Take 2,000 Units by mouth daily.   Yes Historical Provider, MD  hydrochlorothiazide (HYDRODIURIL) 12.5 MG tablet Take 1 tablet (12.5 mg total) by mouth daily. 05/25/16  Yes Juline Patch, MD  lansoprazole (PREVACID) 30 MG capsule Take 1 capsule by mouth daily. GI Doc 10/22/14  Yes Historical Provider, MD  mesalamine (LIALDA) 1.2 g EC tablet Take 2 tablets by mouth daily. GI Doc 04/15/15  Yes Historical Provider, MD  amoxicillin-clavulanate (AUGMENTIN) 875-125 MG tablet Take 1 tablet by mouth 2 (two) times daily. Patient not taking: Reported on 09/28/2016 09/06/16   Harlin Heys, MD  fluconazole (DIFLUCAN) 150  MG tablet Take 1 tablet (150 mg total) by mouth once a week. For 2 doses 09/28/16   Harlin Heys, MD  nystatin-triamcinolone [facility]) cream Apply 1 application topically 2 (two) times daily. PRN/ Derm 10/09/13   Historical Provider, MD  traMADol (ULTRAM) 50 MG tablet Take 1 tablet (50 mg total) by mouth every 8 (eight) hours as needed. Patient not taking: Reported on 09/06/2016 08/30/16   Juline Patch, MD    Family History Family History  Problem Relation Age of Onset  . Colon cancer Mother     Social History Social History  Substance Use Topics  . Smoking status: Former Research scientist (life sciences)  . Smokeless tobacco: Never Used  . Alcohol use 0.6 oz/week    1 Cans of beer per week      Allergies   Lipitor  [atorvastatin]; Metronidazole; and Nsaids   Review of Systems Review of Systems  Respiratory: Positive for shortness of breath.   Cardiovascular: Positive for chest pain.  Neurological: Positive for dizziness.     Physical Exam Triage Vital Signs ED Triage Vitals  Enc Vitals Group     BP 10/20/16 1647 (!) 170/119     Pulse Rate 10/20/16 1647 81     Resp 10/20/16 1647 16     Temp 10/20/16 1647 98.3 F (36.8 C)     Temp Source 10/20/16 1647 Oral     SpO2 10/20/16 1647 99 %     Weight 10/20/16 1650 220 lb (99.8 kg)     Height 10/20/16 1650 5\' 6"  (1.676 m)     Head Circumference --      Peak Flow --      Pain Score 10/20/16 1651 3     Pain Loc --      Pain Edu? --      Excl. in [place]? --    No data found.   Updated Vital Signs BP (!) 168/90 (BP Location: Right Arm)   Pulse 81   Temp 98.3 F (36.8 C) (Oral)   Resp 16   Ht 5\' 6"  (1.676 m)   Wt 220 lb (99.8 kg)   SpO2 99%   BMI 35.51 kg/m   Visual Acuity Right Eye Distance:   Left Eye Distance:   Bilateral Distance:    Right Eye Near:   Left Eye Near:    Bilateral Near:     Physical Exam  Constitutional: She is oriented to person, place, and time. She appears well-developed and well-nourished. No distress.  HENT:  Head: Normocephalic.  Right Ear: Tympanic membrane, external ear and ear canal normal.  Left Ear: Tympanic membrane, external ear and ear canal normal.  Nose: Nose normal.  Mouth/Throat: Oropharynx is clear and moist and mucous membranes are normal.  Eyes: Conjunctivae and EOM are normal. Pupils are equal, round, and reactive to light. Right eye exhibits no discharge. Left eye exhibits no discharge. No scleral icterus.  Neck: Normal range of motion. Neck supple. No JVD present. No tracheal deviation present. No thyromegaly present.  Cardiovascular: Normal rate, regular rhythm, normal heart sounds and intact distal pulses.   No murmur heard. Pulmonary/Chest: Effort normal  and breath sounds normal. No stridor. No respiratory distress. She has no wheezes. She has no rales. She exhibits no tenderness.  Abdominal: Soft. Bowel sounds are normal. She exhibits no distension and no mass. There is no tenderness. There is no rebound and no guarding.  Musculoskeletal: She exhibits no edema or tenderness.  Lymphadenopathy:    She  has no cervical adenopathy.  Neurological: She is alert and oriented to person, place, and time. She has normal reflexes. She displays normal reflexes. No cranial nerve deficit or sensory deficit. She exhibits normal muscle tone. Coordination normal.  Positive Hallpike maneuver  Skin: Skin is warm and dry. No rash noted. She is not diaphoretic. No erythema. No pallor.  Psychiatric: She has a normal mood and affect. Her behavior is normal. Judgment and thought content normal.  Vitals reviewed.    UC Treatments / Results  Labs (all labs ordered are listed, but only abnormal results are displayed) Labs Reviewed - No data to display  EKG  EKG Interpretation None       Radiology No results found.  Procedures .EKG Date/Time: 10/20/2016 6:50 PM Performed by: Norval Gable Authorized by: Norval Gable   ECG reviewed by ED Physician in the absence of a cardiologist: yes   Previous ECG:    Previous ECG:  Unavailable Interpretation:    Interpretation: normal   Rate:    ECG rate assessment: normal   Rhythm:    Rhythm: sinus rhythm   Ectopy:    Ectopy: none   QRS:    QRS axis:  Normal Conduction:    Conduction: normal   ST segments:    ST segments:  Normal T waves:    T waves: normal   Q waves:    Q waves:  III   (including critical care time)  Medications Ordered in UC Medications  cloNIDine (CATAPRES) tablet 0.2 mg (0.2 mg Oral Given 10/20/16 1710)     Initial Impression / Assessment and Plan / UC Course  I have reviewed the triage vital signs and the nursing notes.  Pertinent labs & imaging results that were  available during my care of the patient were reviewed by me and considered in my medical decision making (see chart for details).       Final Clinical Impressions(s) / UC Diagnoses   Final diagnoses:  Benign paroxysmal positional vertigo, unspecified laterality  Essential hypertension  Situational anxiety    New Prescriptions Discharge Medication List as of 10/20/2016  6:16 PM     1. ekg results and diagnosis reviewed with patient 2. Patient given clonidine 0.2mg  po x1 with improvement of blood pressure 3. Recommend supportive treatment with dramamine prn, Epley maneuver vestibular exercises  4. Follow-up with PCP 5. Follow up  prn if symptoms worsen or don't improve   Norval Gable, MD 10/20/16 1859

## 2016-11-04 ENCOUNTER — Ambulatory Visit
Admission: RE | Admit: 2016-11-04 | Discharge: 2016-11-04 | Disposition: A | Discharge status: Home / Self Care | Payer: BLUE CROSS/BLUE SHIELD | Source: Ambulatory Visit | Attending: Obstetrics and Gynecology | Admitting: Obstetrics and Gynecology

## 2016-11-04 DIAGNOSIS — Z1231 Encounter for screening mammogram for malignant neoplasm of breast: Secondary | ICD-10-CM | POA: Insufficient documentation

## 2016-12-13 ENCOUNTER — Other Ambulatory Visit: Payer: Self-pay | Admitting: Nurse Practitioner

## 2016-12-13 ENCOUNTER — Ambulatory Visit
Admission: RE | Admit: 2016-12-13 | Discharge: 2016-12-13 | Disposition: A | Discharge status: Home / Self Care | Payer: BLUE CROSS/BLUE SHIELD | Source: Ambulatory Visit | Attending: Nurse Practitioner | Admitting: Nurse Practitioner

## 2016-12-13 DIAGNOSIS — K529 Noninfective gastroenteritis and colitis, unspecified: Secondary | ICD-10-CM | POA: Diagnosis present

## 2016-12-13 DIAGNOSIS — R1031 Right lower quadrant pain: Secondary | ICD-10-CM | POA: Diagnosis present

## 2016-12-13 DIAGNOSIS — I700 Atherosclerosis of aorta: Secondary | ICD-10-CM | POA: Insufficient documentation

## 2016-12-13 DIAGNOSIS — Z90710 Acquired absence of both cervix and uterus: Secondary | ICD-10-CM | POA: Insufficient documentation

## 2016-12-13 DIAGNOSIS — K5730 Diverticulosis of large intestine without perforation or abscess without bleeding: Secondary | ICD-10-CM | POA: Diagnosis not present

## 2016-12-13 LAB — POCT I-STAT CREATININE: Creatinine, Ser: 0.7 mg/dL (ref 0.44–1.00)

## 2016-12-13 MED ORDER — IOPAMIDOL (ISOVUE-300) INJECTION 61%
100.0000 mL | Freq: Once | INTRAVENOUS | Status: AC | PRN
  Administered 2016-12-13: 100 mL via INTRAVENOUS

## 2017-01-13 ENCOUNTER — Other Ambulatory Visit: Payer: Self-pay | Admitting: Family Medicine

## 2017-01-13 DIAGNOSIS — I10 Essential (primary) hypertension: Secondary | ICD-10-CM

## 2017-02-14 ENCOUNTER — Other Ambulatory Visit: Payer: Self-pay | Admitting: Family Medicine

## 2017-02-14 DIAGNOSIS — I10 Essential (primary) hypertension: Secondary | ICD-10-CM

## 2017-02-16 ENCOUNTER — Ambulatory Visit (INDEPENDENT_AMBULATORY_CARE_PROVIDER_SITE_OTHER): Payer: BLUE CROSS/BLUE SHIELD | Admitting: Family Medicine

## 2017-02-16 ENCOUNTER — Encounter: Payer: Self-pay | Admitting: Family Medicine

## 2017-02-16 VITALS — BP 138/98 | HR 64 | Ht 66.0 in | Wt 231.0 lb

## 2017-02-16 DIAGNOSIS — Z6837 Body mass index (BMI) 37.0-37.9, adult: Secondary | ICD-10-CM | POA: Diagnosis not present

## 2017-02-16 DIAGNOSIS — E6609 Other obesity due to excess calories: Secondary | ICD-10-CM | POA: Diagnosis not present

## 2017-02-16 DIAGNOSIS — I10 Essential (primary) hypertension: Secondary | ICD-10-CM | POA: Diagnosis not present

## 2017-02-16 DIAGNOSIS — E7800 Pure hypercholesterolemia, unspecified: Secondary | ICD-10-CM | POA: Insufficient documentation

## 2017-02-16 MED ORDER — LISINOPRIL-HYDROCHLOROTHIAZIDE 10-12.5 MG PO TABS
1.0000 | ORAL_TABLET | Freq: Every day | ORAL | 1 refills | Status: DC
Start: 2017-02-16 — End: 2017-08-01

## 2017-02-16 NOTE — Patient Instructions (Signed)
Exercising to Lose Weight Exercising can help you to lose weight. In order to lose weight through exercise, you need to do vigorous-intensity exercise. You can tell that you are exercising with vigorous intensity if you are breathing very hard and fast and cannot hold a conversation while exercising. Moderate-intensity exercise helps to maintain your current weight. You can tell that you are exercising at a moderate level if you have a higher heart rate and faster breathing, but you are still able to hold a conversation. How often should I exercise? Choose an activity that you enjoy and set realistic goals. Your health care provider can help you to make an activity plan that works for you. Exercise regularly as directed by your health care provider. This may include:  Doing resistance training twice each week, such as: ? Push-ups. ? Sit-ups. ? Lifting weights. ? Using resistance bands.  Doing a given intensity of exercise for a given amount of time. Choose from these options: ? 150 minutes of moderate-intensity exercise every week. ? 75 minutes of vigorous-intensity exercise every week. ? A mix of moderate-intensity and vigorous-intensity exercise every week.  Children, pregnant women, people who are out of shape, people who are overweight, and older adults may need to consult a health care provider for individual recommendations. If you have any sort of medical condition, be sure to consult your health care provider before starting a new exercise program. What are some activities that can help me to lose weight?  Walking at a rate of at least 4.5 miles an hour.  Jogging or running at a rate of 5 miles per hour.  Biking at a rate of at least 10 miles per hour.  Lap swimming.  Roller-skating or in-line skating.  Cross-country skiing.  Vigorous competitive sports, such as football, basketball, and soccer.  Jumping rope.  Aerobic dancing. How can I be more active in my day-to-day  activities?  Use the stairs instead of the elevator.  Take a walk during your lunch break.  If you drive, park your car farther away from work or school.  If you take public transportation, get off one stop early and walk the rest of the way.  Make all of your phone calls while standing up and walking around.  Get up, stretch, and walk around every 30 minutes throughout the day. What guidelines should I follow while exercising?  Do not exercise so much that you hurt yourself, feel dizzy, or get very short of breath.  Consult your health care provider prior to starting a new exercise program.  Wear comfortable clothes and shoes with good support.  Drink plenty of water while you exercise to prevent dehydration or heat stroke. Body water is lost during exercise and must be replaced.  Work out until you breathe faster and your heart beats faster. This information is not intended to replace advice given to you by your health care provider. Make sure you discuss any questions you have with your health care provider. Document Released: 07/24/2010 Document Revised: 11/27/2015 Document Reviewed: 11/22/2013 Elsevier Interactive Patient Education  2018 [employer]   Calorie Counting for Weight Loss Calories are units of energy. Your body needs a certain amount of calories from food to keep you going throughout the day. When you eat more calories than your body needs, your body stores the extra calories as fat. When you eat fewer calories than your body needs, your body burns fat to get the energy it needs. Calorie counting means keeping track   of how many calories you eat and drink each day. Calorie counting can be helpful if you need to lose weight. If you make sure to eat fewer calories than your body needs, you should lose weight. Ask your health care provider what a healthy weight is for you. For calorie counting to work, you will need to eat the right number of calories in a day in order  to lose a healthy amount of weight per week. A dietitian can help you determine how many calories you need in a day and will give you suggestions on how to reach your calorie goal.  A healthy amount of weight to lose per week is usually 1-2 lb (0.5-0.9 kg). This usually means that your daily calorie intake should be reduced by 500-750 calories.  Eating 1,200 - 1,500 calories per day can help most women lose weight.  Eating 1,500 - 1,800 calories per day can help most men lose weight.  What is my plan? My goal is to have __________ calories per day. If I have this many calories per day, I should lose around __________ pounds per week. What do I need to know about calorie counting? In order to meet your daily calorie goal, you will need to:  Find out how many calories are in each food you would like to eat. Try to do this before you eat.  Decide how much of the food you plan to eat.  Write down what you ate and how many calories it had. Doing this is called keeping a food log.  To successfully lose weight, it is important to balance calorie counting with a healthy lifestyle that includes regular activity. Aim for 150 minutes of moderate exercise (such as walking) or 75 minutes of vigorous exercise (such as running) each week. Where do I find calorie information?  The number of calories in a food can be found on a Nutrition Facts label. If a food does not have a Nutrition Facts label, try to look up the calories online or ask your dietitian for help. Remember that calories are listed per serving. If you choose to have more than one serving of a food, you will have to multiply the calories per serving by the amount of servings you plan to eat. For example, the label on a package of bread might say that a serving size is 1 slice and that there are 90 calories in a serving. If you eat 1 slice, you will have eaten 90 calories. If you eat 2 slices, you will have eaten 180 calories. How do I keep a  food log? Immediately after each meal, record the following information in your food log:  What you ate. Don't forget to include toppings, sauces, and other extras on the food.  How much you ate. This can be measured in cups, ounces, or number of items.  How many calories each food and drink had.  The total number of calories in the meal.  Keep your food log near you, such as in a small notebook in your pocket, or use a mobile app or website. Some programs will calculate calories for you and show you how many calories you have left for the day to meet your goal. What are some calorie counting tips?  Use your calories on foods and drinks that will fill you up and not leave you hungry: ? Some examples of foods that fill you up are nuts and nut butters, vegetables, lean proteins, and high-fiber foods like   whole grains. High-fiber foods are foods with more than 5 g fiber per serving. ? Drinks such as sodas, specialty coffee drinks, alcohol, and juices have a lot of calories, yet do not fill you up.  Eat nutritious foods and avoid empty calories. Empty calories are calories you get from foods or beverages that do not have many vitamins or protein, such as candy, sweets, and soda. It is better to have a nutritious high-calorie food (such as an avocado) than a food with few nutrients (such as a bag of chips).  Know how many calories are in the foods you eat most often. This will help you calculate calorie counts faster.  Pay attention to calories in drinks. Low-calorie drinks include water and unsweetened drinks.  Pay attention to nutrition labels for "low fat" or "fat free" foods. These foods sometimes have the same amount of calories or more calories than the full fat versions. They also often have added sugar, starch, or salt, to make up for flavor that was removed with the fat.  Find a way of tracking calories that works for you. Get creative. Try different apps or programs if writing down  calories does not work for you. What are some portion control tips?  Know how many calories are in a serving. This will help you know how many servings of a certain food you can have.  Use a measuring cup to measure serving sizes. You could also try weighing out portions on a kitchen scale. With time, you will be able to estimate serving sizes for some foods.  Take some time to put servings of different foods on your favorite plates, bowls, and cups so you know what a serving looks like.  Try not to eat straight from a bag or box. Doing this can lead to overeating. Put the amount you would like to eat in a cup or on a plate to make sure you are eating the right portion.  Use smaller plates, glasses, and bowls to prevent overeating.  Try not to multitask (for example, watch TV or use your computer) while eating. If it is time to eat, sit down at a table and enjoy your food. This will help you to know when you are full. It will also help you to be aware of what you are eating and how much you are eating. What are tips for following this plan? Reading food labels  Check the calorie count compared to the serving size. The serving size may be smaller than what you are used to eating.  Check the source of the calories. Make sure the food you are eating is high in vitamins and protein and low in saturated and trans fats. Shopping  Read nutrition labels while you shop. This will help you make healthy decisions before you decide to purchase your food.  Make a grocery list and stick to it. Cooking  Try to cook your favorite foods in a healthier way. For example, try baking instead of frying.  Use low-fat dairy products. Meal planning  Use more fruits and vegetables. Half of your plate should be fruits and vegetables.  Include lean proteins like poultry and fish. How do I count calories when eating out?  Ask for smaller portion sizes.  Consider sharing an entree and sides instead of  getting your own entree.  If you get your own entree, eat only half. Ask for a box at the beginning of your meal and put the rest of your entree in   you are not tempted to eat it.  If calories are listed on the menu, choose the lower calorie options.  Choose dishes that include vegetables, fruits, whole grains, low-fat dairy products, and lean protein.  Choose items that are boiled, broiled, grilled, or steamed. Stay away from items that are buttered, battered, fried, or served with cream sauce. Items labeled "crispy" are usually fried, unless stated otherwise.  Choose water, low-fat milk, unsweetened iced tea, or other drinks without added sugar. If you want an alcoholic beverage, choose a lower calorie option such as a glass of wine or light beer.  Ask for dressings, sauces, and syrups on the side. These are usually high in calories, so you should limit the amount you eat.  If you want a salad, choose a garden salad and ask for grilled meats. Avoid extra toppings like bacon, cheese, or fried items. Ask for the dressing on the side, or ask for olive oil and vinegar or lemon to use as dressing.  Estimate how many servings of a food you are given. For example, a serving of cooked rice is  cup or about the size of half a baseball. Knowing serving sizes will help you be aware of how much food you are eating at restaurants. The list below tells you how big or small some common portion sizes are based on everyday objects: ? 1 oz-4 stacked dice. ? 3 oz-1 deck of cards. ? 1 tsp-1 die. ? 1 Tbsp- a ping-pong ball. ? 2 Tbsp-1 ping-pong ball. ?  cup- baseball. ? 1 cup-1 baseball. Summary  Calorie counting means keeping track of how many calories you eat and drink each day. If you eat fewer calories than your body needs, you should lose weight.  A healthy amount of weight to lose per week is usually 1-2 lb (0.5-0.9 kg). This usually means reducing your daily calorie intake by 500-750 calories.  The  number of calories in a food can be found on a Nutrition Facts label. If a food does not have a Nutrition Facts label, try to look up the calories online or ask your dietitian for help.  Use your calories on foods and drinks that will fill you up, and not on foods and drinks that will leave you hungry.  Use smaller plates, glasses, and bowls to prevent overeating. This information is not intended to replace advice given to you by your health care provider. Make sure you discuss any questions you have with your health care provider. Document Released: 06/21/2005 Document Revised: 05/21/2016 Document Reviewed: 05/21/2016 Elsevier Interactive Patient Education  2017 [place]  LOW-CHOLESTEROL, LOW-TRIGLYCERIDE DIETS    FOODS TO USE   MEATS, FISH Choose lean meats (chicken, [place], veal, and non-fatty cuts of beef with excess fat trimmed; one serving = 3 oz of cooked meat). Also, fresh or frozen fish, canned fish packed in water, and shellfish (lobster, crabs, shrimp, and oysters). Limit use to no more than one serving of one of these per week. Shellfish are high in cholesterol but low in saturated fat and should be used sparingly. Meats and fish should be broiled (pan or oven) or baked on a rack.  EGGS Egg substitutes and egg whites (use freely). Egg yolks (limit two per week).  FRUITS Eat three servings of fresh fruit per day (1 serving =  cup). Be sure to have at least one citrus fruit daily. Frozen and canned fruit with no sugar or syrup added may be used.  VEGETABLES Most  vegetables are not limited (see next page). One dark-green (string beans, escarole) or one deep yellow (squash) vegetable is recommended daily. Cauliflower, broccoli, and celery, as well as potato skins, are recommended for their fiber content. (Fiber is associated with cholesterol reduction) It is preferable to steam vegetables, but they may be boiled, strained, or braised with polyunsaturated vegetable oil  (see below).  BEANS Dried peas or beans (1 serving =  cup) may be used as a bread substitute.  NUTS Almonds, walnuts, and peanuts may be used sparingly  (1 serving = 1 Tablespoonful). Use pumpkin, sesame, or sunflower seeds.  BREADS, GRAINS One roll or one slice of whole grain or enriched bread may be used, or three soda crackers or four pieces of melba toast as a substitute. Spaghetti, rice or noodles ( cup) or  large ear of corn may be used as a bread substitute. In preparing these foods do not use butter or shortening, use soft margarine. Also use egg and sugar substitutes.  Choose high fiber grains, such as oats and whole wheat.  CEREALS Use  cup of hot cereal or  cup of cold cereal per day. Add a sugar substitute if desired, with 99% fat free or skim milk.  MILK PRODUCTS Always use 99% fat free or skim milk, dairy products such as low fat cheeses (farmer's uncreamed diet cottage), low-fat yogurt, and powdered skim milk.  FATS, OILS Use soft (not stick) margarine; vegetable oils that are high in polyunsaturated fats (such as safflower, sunflower, soybean, corn, and cottonseed). Always refrigerate meat drippings to harden the fat and remove it before preparing gravies  DESSERTS, SNACKS Limit to two servings per day; substitute each serving for a bread/cereal serving: ice milk, water sherbet (1/4 cup); unflavored gelatin or gelatin flavored with sugar substitute (1/3 cup); pudding prepared with skim milk (1/2 cup); egg white souffls; unbuttered popcorn (1  cups). Substitute carob for chocolate.  BEVERAGES Fresh fruit juices (limit 4 oz per day); black coffee, plain or herbal teas; soft drinks with sugar substitutes; club soda, preferably salt-free; cocoa made with skim milk or nonfat dried milk and water (sugar substitute added if desired); clear broth. Alcohol: limit two servings per day (see second page).  MISCELLANEOUS  You may use the following freely: vinegar, spices, herbs, nonfat bouillon,  mustard, Worcestershire sauce, soy sauce, flavoring essence.                  GUIDELINES FOR  LOW-CHOLESTEROL, LOW TRIGLYCERIDE DIETS    FOODS TO AVOID   MEATS, FISH Marbled beef, pork, bacon, sausage, and other pork products; fatty fowl (duck, goose); skin and fat of [place] and chicken; processed meats; luncheon meats (salami, bologna); frankfurters and fast-food hamburgers (theyre loaded with fat); organ meats (kidneys, liver); canned fish packed in oil.  EGGS Limit egg yolks to two per week.   FRUITS Coconuts (rich in saturated fats).  VEGETABLES Avoid avocados. Starchy vegetables (potatoes, corn, lima beans, dried peas, beans) may be used only if substitutes for a serving of bread or cereal. (Baked potato skin, however, is desirable for its fiber content.  BEANS Commercial baked beans with sugar and/or pork added.  NUTS Avoid nuts.  Limit peanuts and walnuts to one tablespoonful per day.  BREADS, GRAINS Any baked goods with shortening and/or sugar. Commercial mixes with dried eggs and whole milk. Avoid sweet rolls, doughnuts, breakfast pastries (Danish), and sweetened packaged cereals (the added sugar converts readily to triglycerides).  MILK PRODUCTS Whole milk and whole-milk packaged  goods; cream; ice cream; whole-milk puddings, yogurt, or cheeses; nondairy cream substitutes.  FATS, OILS Butter, lard, animal fats, bacon drippings, gravies, cream sauces as well as palm and coconut oils. All these are high in saturated fats. Examine labels on cholesterol free products for hydrogenated fats. (These are oils that have been hardened into solids and in the process have become saturated.)  DESSERTS, SNACKS Fried snack foods like potato chips; chocolate; candies in general; jams, jellies, syrups; whole- milk puddings; ice cream and milk sherbets; hydrogenated peanut butter.  BEVERAGES Sugared fruit juices and soft drinks; cocoa made with whole milk and/or sugar. When using alcohol (1 oz  liquor, 5 oz beer, or 2  oz dry table wine per serving), one serving must be substituted for one bread or cereal serving (limit, two servings of alcohol per day).   SPECIAL NOTES    1. Remember that even non-limited foods should be used in moderation. 2. While on a cholesterol-lowering diet, be sure to avoid animal fats and marbled meats. 3. 3. While on a triglyceride-lowering diet, be sure to avoid sweets and to control the amount of carbohydrates you eat (starchy foods such as flour, bread, potatoes).While on a tri-glyceride-lowering diet, be sure to avoid sweets 4. Buy a good low-fat cookbook, such as the one published by the American Heart Association. 5. Consult your physician if you have any questions.               [facility] Low Glycemic Diet Plan   Low Glycemic Foods (20-49) Moderate Glycemic Foods (50-69) High Glycemic Foods (70-100)      Breakfast Creals Breakfast Cereals Breakfast Cereals  All Bran All-Bran Fruit'n Oats   Bran Buds Bran Chex   Cheerios Corn chex    Fiber One Oatmeal (not instant)   Just Right Mini-Wheats   Corn Flakes Cream of Wheat    Oat Bran Special K Swiss Muesli   Grape Nuts Grape Nut Flakes      Grits Nutri-Grain    Fruits and fruit juice: Fruits Puffed Rice Puffed Wheat    (Limit to 1-2 Servings per day) Banana (under-ride) Dates   Rice Chex Rice Krispies    Apples Apricots (fresh/dried)   Figs Grapes   Shredded Wheat Team    Blackberries Blueberries   Kiwi Mango   Total     Cherries Cranberries   Oranges Raisins     Peaches Pears    Fruits  Plums Prunes   Fruit Juices Pineapple Watermelon    Grapefruit Raspberries   Cranberry Juice Orange Juice   Banana (over-ripe)     Strawberries Tangerines      Apple Juice Grapefruit Juice   Beans and Legumes Beverages  Tomato Juice    [place]-type baked beans Sodas, sweet tea, pineapple juice   Canned pinto, kidney, or navy beans   Beans and Legumes (fresh-cooked)  Green peas Vegetables  Black-eyed peas Butter Beans    Potato, baked, boiled, fried, mashed  Chick peas Lentils   Vegetables [place] fries  Green beans Lima beans   Beets Carrots   Canned or frozen corn  Kidney beans Navy beans   Sweet potato Yam   Parsnips  Pinto beans Snow peas   Corn on the cob Winter squash      Non-starchy vegetables Grains Breads  Asparagus, avocado, broccoli, cabbage Cornmeal Rice, brown   Most breads (white and whole grain)  cauliflower, celery, cucumber, greens Rice, white Couscous   Bagels Bread sticks  lettuce, mushrooms, peppers, tomatoes  Bread stuffing Kaiser roll    okra, onions, spinach, summer squash Pasta Dinner rolls   [employer], cheese     Grains Ravioli, meat filled Spaghetti, white   Grains  Barley Bulgur    Rice, instant Tapioca, with milk    Rye Wild rice   Nuts    Cashews Macadamia   Candy and most cookies  Nuts and oils    Almonds, peanuts, sunflower seeds Snacks Snacks  hazelnuts, pecans, walnuts Chocolate Ice cream, lowfat   Donuts Corn chips    Oils that are liquid at room temperature Muffin Popcorn   Jelly beans Pretzels      Pastries  Dairy, fish, meat, soy, and eggs    Milk, skim Lowfat cheese    Restaurant and ethnic foods  Yogurt, lowfat, fruit sugar sweetened  Most [place] food (sugar in stir fry    or wok sauce)  Lean red meat Fish    Teriyaki-style meats and vegetables  Skinless chicken and [place], shellfish        Egg whites (up to 3 daily), Soy Products    Egg yolks (up to 7 or _____ per week)

## 2017-02-16 NOTE — Progress Notes (Signed)
Name: Theresa Gross   MRN: [id number]    DOB: 10/24/1955   Date:02/16/2017       Progress Note  Subjective  Chief Complaint  Chief Complaint  Patient presents with  . Hypertension    use to be on Lisinopril/ HCTZ, lost weight and dropped to HCTZ by itself. B/P at other offices has slowly been increasing- not opposed to going back on medicine.    Hypertension  This is a chronic problem. The current episode started more than 1 year ago. The problem has been gradually worsening since onset. The problem is controlled. Pertinent negatives include no anxiety, blurred vision, chest pain, headaches, malaise/fatigue, neck pain, orthopnea, palpitations, peripheral edema, PND, shortness of breath or sweats. There are no associated agents to hypertension. There are no known risk factors for coronary artery disease. Past treatments include diuretics. The current treatment provides moderate improvement. There are no compliance problems.  There is no history of angina, kidney disease, CAD/MI, CVA, heart failure, left ventricular hypertrophy, PVD or retinopathy. There is no history of chronic renal disease, a hypertension causing med or renovascular disease.    No problem-specific Assessment & Plan notes found for this encounter.   Past Medical History:  Diagnosis Date  . Cervical cancer [facility])    age 20  . Diverticulosis   . GERD (gastroesophageal reflux disease)   . Hemorrhoids   . History of colon polyps   . Hyperlipemia   . Hypertension   . Vitamin D deficiency     Past Surgical History:  Procedure Laterality Date  . ABDOMINAL HYSTERECTOMY    . BREAST BIOPSY Right 10 plus yrs ago   stereo bx. Benign  . CHOLECYSTECTOMY    . COLONOSCOPY WITH PROPOFOL N/A 06/16/2016   Procedure: COLONOSCOPY WITH PROPOFOL;  Surgeon: Manya Silvas, MD;  Location: [facility] ENDOSCOPY;  Service: Endoscopy;  Laterality: N/A;  . DIAGNOSTIC LAPAROSCOPY    . EYE SURGERY    . GALLBLADDER SURGERY  06/04/2014     Family History  Problem Relation Age of Onset  . Colon cancer Mother     Social History   Social History  . Marital status: Married    Spouse name: N/A  . Number of children: N/A  . Years of education: N/A   Occupational History  . Not on file.   Social History Main Topics  . Smoking status: Former Research scientist (life sciences)  . Smokeless tobacco: Never Used  . Alcohol use 0.6 oz/week    1 Cans of beer per week  . Drug use: No  . Sexual activity: Yes    Birth control/ protection: Surgical   Other Topics Concern  . Not on file   Social History Narrative  . No narrative on file    Allergies  Allergen Reactions  . Lipitor  [Atorvastatin]   . Metronidazole   . Nsaids Other (See Comments)    Have to be careful due to liver    Outpatient Medications Prior to Visit  Medication Sig Dispense Refill  . Apremilast 30 MG TABS Take 1 tablet by mouth 2 (two) times daily. Derm    . aspirin 81 MG tablet Take 1 tablet by mouth daily.    . Cholecalciferol (VITAMIN D) 2000 units tablet Take 2,000 Units by mouth daily.    . lansoprazole (PREVACID) 30 MG capsule Take 1 capsule by mouth daily. GI Doc    . hydrochlorothiazide (HYDRODIURIL) 12.5 MG tablet TAKE 1 TABLET BY MOUTH ONCE DAILY 30 tablet 0  .  mesalamine (LIALDA) 1.2 g EC tablet Take 2 tablets by mouth daily. GI Doc    . albuterol (PROVENTIL HFA;VENTOLIN HFA) 108 (90 Base) MCG/ACT inhaler Inhale 2 puffs into the lungs every 6 (six) hours as needed for wheezing or shortness of breath. (Patient not taking: Reported on 02/16/2017) 1 Inhaler 0  . nystatin-triamcinolone (MYCOLOG II) cream Apply 1 application topically 2 (two) times daily. PRN/ Derm    . amoxicillin-clavulanate (AUGMENTIN) 875-125 MG tablet Take 1 tablet by mouth 2 (two) times daily. (Patient not taking: Reported on 09/28/2016) 14 tablet 0  . fluconazole (DIFLUCAN) 150 MG tablet Take 1 tablet (150 mg total) by mouth once a week. For 2 doses 2 tablet 0  . traMADol (ULTRAM) 50 MG tablet  Take 1 tablet (50 mg total) by mouth every 8 (eight) hours as needed. (Patient not taking: Reported on 09/06/2016) 30 tablet 0   Facility-Administered Medications Prior to Visit  Medication Dose Route Frequency Provider Last Rate Last Dose  . ipratropium-albuterol (DUONEB) 0.5-2.5 (3) MG/3ML nebulizer solution 3 mL  3 mL Nebulization Once Juline Patch, MD        Review of Systems  Constitutional: Negative for chills, fever, malaise/fatigue and weight loss.  HENT: Negative for ear discharge, ear pain and sore throat.   Eyes: Negative for blurred vision.  Respiratory: Negative for cough, sputum production, shortness of breath and wheezing.   Cardiovascular: Negative for chest pain, palpitations, orthopnea, leg swelling and PND.  Gastrointestinal: Negative for abdominal pain, blood in stool, constipation, diarrhea, heartburn, melena and nausea.  Genitourinary: Negative for dysuria, frequency, hematuria and urgency.  Musculoskeletal: Negative for back pain, joint pain, myalgias and neck pain.  Skin: Negative for rash.  Neurological: Negative for dizziness, tingling, sensory change, focal weakness and headaches.  Endo/Heme/Allergies: Negative for environmental allergies and polydipsia. Does not bruise/bleed easily.  Psychiatric/Behavioral: Negative for depression and suicidal ideas. The patient is not nervous/anxious and does not have insomnia.      Objective  Vitals:   02/16/17 1030  BP: (!) 138/98  Pulse: 64  Weight: 231 lb (104.8 kg)  Height: 5\' 6"  (1.676 m)    Physical Exam  Constitutional: She is well-developed, well-nourished, and in no distress. No distress.  HENT:  Head: Normocephalic and atraumatic.  Right Ear: External ear normal.  Left Ear: External ear normal.  Nose: Nose normal.  Mouth/Throat: Oropharynx is clear and moist.  Eyes: Pupils are equal, round, and reactive to light. Conjunctivae and EOM are normal. Right eye exhibits no discharge. Left eye exhibits no  discharge.  Neck: Normal range of motion. Neck supple. No JVD present. No thyromegaly present.  Cardiovascular: Normal rate, regular rhythm, normal heart sounds and intact distal pulses.  Exam reveals no gallop and no friction rub.   No murmur heard. Pulmonary/Chest: Effort normal and breath sounds normal. She has no wheezes. She has no rales.  Abdominal: Soft. Bowel sounds are normal. She exhibits no mass. There is no tenderness. There is no guarding.  Musculoskeletal: Normal range of motion. She exhibits no edema.  Lymphadenopathy:    She has no cervical adenopathy.  Neurological: She is alert. She has normal reflexes.  Skin: Skin is warm and dry. No rash noted. She is not diaphoretic. No erythema.  Psychiatric: Mood and affect normal.  Nursing note and vitals reviewed.     Assessment & Plan  Problem List Items Addressed This Visit      Cardiovascular and Mediastinum   Essential (primary) hypertension - Primary  Relevant Medications   lisinopril-hydrochlorothiazide (PRINZIDE,ZESTORETIC) 10-12.5 MG tablet     Other   Class 2 obesity due to excess calories without serious comorbidity with body mass index (BMI) of 37.0 to 37.9 in adult   Pure hypercholesterolemia   Relevant Medications   lisinopril-hydrochlorothiazide (PRINZIDE,ZESTORETIC) 10-12.5 MG tablet      Meds ordered this encounter  Medications  . lisinopril-hydrochlorothiazide (PRINZIDE,ZESTORETIC) 10-12.5 MG tablet    Sig: Take 1 tablet by mouth daily.    Dispense:  90 tablet    Refill:  1      Dr. Otilio Miu [facility]  02/16/17

## 2017-07-19 ENCOUNTER — Ambulatory Visit: Payer: BLUE CROSS/BLUE SHIELD | Admitting: Family Medicine

## 2017-07-19 ENCOUNTER — Encounter: Payer: Self-pay | Admitting: Family Medicine

## 2017-07-19 VITALS — BP 108/78 | HR 80 | Temp 98.2°F | Ht 66.0 in | Wt 224.0 lb

## 2017-07-19 DIAGNOSIS — H6121 Impacted cerumen, right ear: Secondary | ICD-10-CM

## 2017-07-19 DIAGNOSIS — J219 Acute bronchiolitis, unspecified: Secondary | ICD-10-CM

## 2017-07-19 DIAGNOSIS — J4541 Moderate persistent asthma with (acute) exacerbation: Secondary | ICD-10-CM | POA: Diagnosis not present

## 2017-07-19 MED ORDER — ALBUTEROL SULFATE HFA 108 (90 BASE) MCG/ACT IN AERS: 2.0000 | INHALATION_SPRAY | Freq: Four times a day (QID) | RESPIRATORY_TRACT | 0 refills | Status: DC | PRN

## 2017-07-19 MED ORDER — GUAIFENESIN-CODEINE 100-10 MG/5ML PO SYRP: 5.0000 mL | ORAL_SOLUTION | Freq: Three times a day (TID) | ORAL | 0 refills | Status: DC | PRN

## 2017-07-19 MED ORDER — IPRATROPIUM-ALBUTEROL 0.5-2.5 (3) MG/3ML IN SOLN: 3.0000 mL | Freq: Once | RESPIRATORY_TRACT | Status: DC

## 2017-07-19 MED ORDER — CARBAMIDE PEROXIDE 6.5 % OT SOLN: 5.0000 [drp] | Freq: Two times a day (BID) | OTIC | 0 refills | Status: DC

## 2017-07-19 MED ORDER — AZITHROMYCIN 250 MG PO TABS: ORAL_TABLET | ORAL | 0 refills | Status: DC

## 2017-07-19 NOTE — Progress Notes (Signed)
Name: Theresa Gross   MRN: [id number]    DOB: March 27, 1956   Date:07/19/2017       Progress Note  Subjective  Chief Complaint  Chief Complaint  Patient presents with  . Cough    Cough/congestion. Started last Wednesday- drainage with green production. Cough gets worse at night. Headaches.     Cough  This is a new problem. The current episode started in the past 7 days (tuesday). The problem has been gradually worsening. The problem occurs hourly. The cough is productive of purulent sputum (yellow /green). Associated symptoms include chills, ear congestion, headaches, myalgias, nasal congestion, postnasal drip, rhinorrhea, a sore throat, shortness of breath, sweats, weight loss and wheezing. Pertinent negatives include no chest pain, ear pain, fever, heartburn, hemoptysis or rash. She has tried OTC cough suppressant and a beta-agonist inhaler (mucinex DM/ibuprofen) for the symptoms. The treatment provided mild relief. There is no history of asthma, COPD, emphysema or environmental allergies.    No problem-specific Assessment & Plan notes found for this encounter.   Past Medical History:  Diagnosis Date  . Cervical cancer [facility])    age 13  . Diverticulosis   . GERD (gastroesophageal reflux disease)   . Hemorrhoids   . History of colon polyps   . Hyperlipemia   . Hypertension   . Vitamin D deficiency     Past Surgical History:  Procedure Laterality Date  . ABDOMINAL HYSTERECTOMY    . BREAST BIOPSY Right 10 plus yrs ago   stereo bx. Benign  . CHOLECYSTECTOMY    . COLONOSCOPY WITH PROPOFOL N/A 06/16/2016   Procedure: COLONOSCOPY WITH PROPOFOL;  Surgeon: Manya Silvas, MD;  Location: [facility] ENDOSCOPY;  Service: Endoscopy;  Laterality: N/A;  . DIAGNOSTIC LAPAROSCOPY    . EYE SURGERY    . GALLBLADDER SURGERY  06/04/2014    Family History  Problem Relation Age of Onset  . Colon cancer Mother     Social History   Socioeconomic History  . Marital status: Married    Spouse  name: Not on file  . Number of children: Not on file  . Years of education: Not on file  . Highest education level: Not on file  Social Needs  . Financial resource strain: Not on file  . Food insecurity - worry: Not on file  . Food insecurity - inability: Not on file  . Transportation needs - medical: Not on file  . Transportation needs - non-medical: Not on file  Occupational History  . Not on file  Tobacco Use  . Smoking status: Former Research scientist (life sciences)  . Smokeless tobacco: Never Used  Substance and Sexual Activity  . Alcohol use: Yes    Alcohol/week: 0.6 oz    Types: 1 Cans of beer per week  . Drug use: No  . Sexual activity: Yes    Birth control/protection: Surgical  Other Topics Concern  . Not on file  Social History Narrative  . Not on file    Allergies  Allergen Reactions  . Lipitor  [Atorvastatin]   . Metronidazole   . Nsaids Other (See Comments)    Have to be careful due to liver    Outpatient Medications Prior to Visit  Medication Sig Dispense Refill  . Apremilast 30 MG TABS Take 1 tablet by mouth 2 (two) times daily. Derm    . aspirin 81 MG tablet Take 1 tablet by mouth daily.    . Cholecalciferol (VITAMIN D) 2000 units tablet Take 2,000 Units by mouth daily.    Marland Kitchen  DELZICOL 400 MG CPDR DR capsule Take 2 tablets by mouth 2 (two) times daily. GI Dr  3  . lansoprazole (PREVACID) 30 MG capsule Take 1 capsule by mouth daily. GI Doc    . lisinopril-hydrochlorothiazide (PRINZIDE,ZESTORETIC) 10-12.5 MG tablet Take 1 tablet by mouth daily. 90 tablet 1  . nystatin-triamcinolone (MYCOLOG II) cream Apply 1 application topically 2 (two) times daily. PRN/ Derm    . albuterol (PROVENTIL HFA;VENTOLIN HFA) 108 (90 Base) MCG/ACT inhaler Inhale 2 puffs into the lungs every 6 (six) hours as needed for wheezing or shortness of breath. 1 Inhaler 0   Facility-Administered Medications Prior to Visit  Medication Dose Route Frequency Provider Last Rate Last Dose  . ipratropium-albuterol  (DUONEB) 0.5-2.5 (3) MG/3ML nebulizer solution 3 mL  3 mL Nebulization Once Juline Patch, MD        Review of Systems  Constitutional: Positive for chills and weight loss. Negative for fever and malaise/fatigue.  HENT: Positive for postnasal drip, rhinorrhea and sore throat. Negative for ear discharge and ear pain.   Eyes: Negative for blurred vision.  Respiratory: Positive for cough, sputum production, shortness of breath and wheezing. Negative for hemoptysis.   Cardiovascular: Negative for chest pain, palpitations and leg swelling.  Gastrointestinal: Negative for abdominal pain, blood in stool, constipation, diarrhea, heartburn, melena and nausea.  Genitourinary: Negative for dysuria, frequency, hematuria and urgency.  Musculoskeletal: Positive for myalgias. Negative for back pain, joint pain and neck pain.  Skin: Negative for rash.  Neurological: Positive for headaches. Negative for dizziness, tingling, sensory change and focal weakness.  Endo/Heme/Allergies: Negative for environmental allergies and polydipsia. Does not bruise/bleed easily.  Psychiatric/Behavioral: Negative for depression and suicidal ideas. The patient is not nervous/anxious and does not have insomnia.      Objective  Vitals:   07/19/17 1038  BP: 108/78  Pulse: 80  Temp: 98.2 F (36.8 C)  TempSrc: Oral  Weight: 224 lb (101.6 kg)  Height: 5\' 6"  (1.676 m)    Physical Exam  Constitutional: She is well-developed, well-nourished, and in no distress. No distress.  HENT:  Head: Normocephalic and atraumatic.  Right Ear: External ear normal.  Left Ear: Tympanic membrane, external ear and ear canal normal.  Nose: Nose normal.  Mouth/Throat: Oropharynx is clear and moist.  Cerumen impaction right  Eyes: Conjunctivae and EOM are normal. Pupils are equal, round, and reactive to light. Right eye exhibits no discharge. Left eye exhibits no discharge.  Neck: Normal range of motion. Neck supple. No JVD present. No  thyromegaly present.  Cardiovascular: Normal rate, regular rhythm, normal heart sounds and intact distal pulses. Exam reveals no gallop and no friction rub.  No murmur heard. Pulmonary/Chest: Effort normal. She has wheezes. She has no rales.  Abdominal: Soft. Bowel sounds are normal. She exhibits no mass. There is no tenderness. There is no guarding.  Musculoskeletal: Normal range of motion. She exhibits no edema.  Lymphadenopathy:    She has no cervical adenopathy.  Neurological: She is alert. She has normal reflexes.  Skin: Skin is warm and dry. She is not diaphoretic.  Psychiatric: Mood and affect normal.  Nursing note and vitals reviewed.     Assessment & Plan  Problem List Items Addressed This Visit    None    Visit Diagnoses    Bronchiolitis    -  Primary   Relevant Medications   albuterol (PROVENTIL HFA;VENTOLIN HFA) 108 (90 Base) MCG/ACT inhaler   ipratropium-albuterol (DUONEB) 0.5-2.5 (3) MG/3ML nebulizer solution 3 mL  guaiFENesin-codeine (ROBITUSSIN AC) 100-10 MG/5ML syrup   Moderate persistent reactive airway disease with acute exacerbation       Relevant Medications   albuterol (PROVENTIL HFA;VENTOLIN HFA) 108 (90 Base) MCG/ACT inhaler   ipratropium-albuterol (DUONEB) 0.5-2.5 (3) MG/3ML nebulizer solution 3 mL   guaiFENesin-codeine (ROBITUSSIN AC) 100-10 MG/5ML syrup   Impacted cerumen of right ear          Meds ordered this encounter  Medications  . albuterol (PROVENTIL HFA;VENTOLIN HFA) 108 (90 Base) MCG/ACT inhaler    Sig: Inhale 2 puffs into the lungs every 6 (six) hours as needed for wheezing or shortness of breath.    Dispense:  1 Inhaler    Refill:  0  . ipratropium-albuterol (DUONEB) 0.5-2.5 (3) MG/3ML nebulizer solution 3 mL  . azithromycin (ZITHROMAX) 250 MG tablet    Sig: 2 today then 1 a day for 4 days    Dispense:  6 tablet    Refill:  0  . carbamide peroxide (DEBROX) 6.5 % OTIC solution    Sig: Place 5 drops into the right ear 2 (two) times  daily.    Dispense:  15 mL    Refill:  0  . guaiFENesin-codeine (ROBITUSSIN AC) 100-10 MG/5ML syrup    Sig: Take 5 mLs by mouth 3 (three) times daily as needed for cough.    Dispense:  150 mL    Refill:  0      Dr. Mac[facility]  07/19/17

## 2017-08-01 ENCOUNTER — Other Ambulatory Visit: Payer: Self-pay | Admitting: Family Medicine

## 2017-08-01 DIAGNOSIS — I10 Essential (primary) hypertension: Secondary | ICD-10-CM

## 2017-08-09 ENCOUNTER — Other Ambulatory Visit: Payer: Self-pay

## 2017-08-09 ENCOUNTER — Telehealth: Payer: Self-pay

## 2017-08-09 DIAGNOSIS — J219 Acute bronchiolitis, unspecified: Secondary | ICD-10-CM

## 2017-08-09 MED ORDER — DOXYCYCLINE HYCLATE 100 MG PO TABS: 100.0000 mg | ORAL_TABLET | Freq: Two times a day (BID) | ORAL | 0 refills | Status: DC

## 2017-08-09 NOTE — Telephone Encounter (Signed)
Called in wanting another antibiotic. Had a ZPack on 07/19/17, felt better then got sick again with same symptoms. Sent in Doxy to [place]

## 2017-08-23 ENCOUNTER — Ambulatory Visit: Payer: BLUE CROSS/BLUE SHIELD | Admitting: Family Medicine

## 2017-08-23 ENCOUNTER — Encounter: Payer: Self-pay | Admitting: Family Medicine

## 2017-08-23 VITALS — BP 120/80 | HR 76 | Ht 66.0 in | Wt 223.0 lb

## 2017-08-23 DIAGNOSIS — I10 Essential (primary) hypertension: Secondary | ICD-10-CM | POA: Diagnosis not present

## 2017-08-23 DIAGNOSIS — E7800 Pure hypercholesterolemia, unspecified: Secondary | ICD-10-CM

## 2017-08-23 DIAGNOSIS — R079 Chest pain, unspecified: Secondary | ICD-10-CM | POA: Diagnosis not present

## 2017-08-23 DIAGNOSIS — I700 Atherosclerosis of aorta: Secondary | ICD-10-CM

## 2017-08-23 MED ORDER — LISINOPRIL-HYDROCHLOROTHIAZIDE 10-12.5 MG PO TABS: 1.0000 | ORAL_TABLET | Freq: Every day | ORAL | 1 refills | Status: DC

## 2017-08-23 NOTE — Progress Notes (Signed)
Name: Theresa Gross   MRN: [id number]    DOB: 1955-08-08   Date:08/23/2017       Progress Note  Subjective  Chief Complaint  Chief Complaint  Patient presents with  . Hypertension    needs labs/ renal and lipid    Hypertension  This is a new problem. The current episode started more than 1 year ago. The problem is unchanged. The problem is controlled. Associated symptoms include chest pain, palpitations and shortness of breath. Pertinent negatives include no anxiety, blurred vision, headaches, malaise/fatigue, neck pain, orthopnea, peripheral edema, PND or sweats. There are no associated agents to hypertension. Risk factors for coronary artery disease include dyslipidemia and obesity. Past treatments include ACE inhibitors and diuretics. The current treatment provides moderate improvement. There are no compliance problems.  There is no history of angina, kidney disease, CAD/MI, CVA, heart failure, left ventricular hypertrophy, PVD or retinopathy. There is no history of chronic renal disease, a hypertension causing med or renovascular disease.  Hyperlipidemia  This is a chronic problem. The problem is controlled. Exacerbating diseases include obesity. She has no history of chronic renal disease or diabetes. There are no known factors aggravating her hyperlipidemia. Associated symptoms include chest pain and shortness of breath. Pertinent negatives include no focal sensory loss, focal weakness, leg pain or myalgias. Current antihyperlipidemic treatment includes diet change. The current treatment provides mild improvement of lipids. There are no compliance problems.  Risk factors for coronary artery disease include dyslipidemia, hypertension and obesity.  Chest Pain   This is a new problem. The current episode started more than 1 month ago. The onset quality is sudden. The problem has been waxing and waning. The pain is present in the substernal region. The pain is at a severity of 3/10. The pain is  moderate. The quality of the pain is described as tightness. The pain does not radiate. Associated symptoms include exertional chest pressure, palpitations and shortness of breath. Pertinent negatives include no abdominal pain, back pain, claudication, cough, diaphoresis, dizziness, fever, headaches, irregular heartbeat, leg pain, malaise/fatigue, nausea, orthopnea, PND or sputum production.  Her past medical history is significant for hyperlipidemia and hypertension.  Pertinent negatives for past medical history include no diabetes and no PVD.    No problem-specific Assessment & Plan notes found for this encounter.   Past Medical History:  Diagnosis Date  . Cervical cancer [facility])    age 62  . Diverticulosis   . GERD (gastroesophageal reflux disease)   . Hemorrhoids   . History of colon polyps   . Hyperlipemia   . Hypertension   . Vitamin D deficiency     Past Surgical History:  Procedure Laterality Date  . ABDOMINAL HYSTERECTOMY    . BREAST BIOPSY Right 10 plus yrs ago   stereo bx. Benign  . CHOLECYSTECTOMY    . COLONOSCOPY WITH PROPOFOL N/A 06/16/2016   Procedure: COLONOSCOPY WITH PROPOFOL;  Surgeon: Manya Silvas, MD;  Location: [facility] ENDOSCOPY;  Service: Endoscopy;  Laterality: N/A;  . DIAGNOSTIC LAPAROSCOPY    . EYE SURGERY    . GALLBLADDER SURGERY  06/04/2014    Family History  Problem Relation Age of Onset  . Colon cancer Mother     Social History   Socioeconomic History  . Marital status: Married    Spouse name: Not on file  . Number of children: Not on file  . Years of education: Not on file  . Highest education level: Not on file  Social Needs  .  Financial resource strain: Not on file  . Food insecurity - worry: Not on file  . Food insecurity - inability: Not on file  . Transportation needs - medical: Not on file  . Transportation needs - non-medical: Not on file  Occupational History  . Not on file  Tobacco Use  . Smoking status: Former Research scientist (life sciences)  .  Smokeless tobacco: Never Used  Substance and Sexual Activity  . Alcohol use: Yes    Alcohol/week: 0.6 oz    Types: 1 Cans of beer per week  . Drug use: No  . Sexual activity: Yes    Birth control/protection: Surgical  Other Topics Concern  . Not on file  Social History Narrative  . Not on file    Allergies  Allergen Reactions  . Lipitor  [Atorvastatin]   . Metronidazole   . Nsaids Other (See Comments)    Have to be careful due to liver    Outpatient Medications Prior to Visit  Medication Sig Dispense Refill  . albuterol (PROVENTIL HFA;VENTOLIN HFA) 108 (90 Base) MCG/ACT inhaler Inhale 2 puffs into the lungs every 6 (six) hours as needed for wheezing or shortness of breath. 1 Inhaler 0  . Apremilast 30 MG TABS Take 1 tablet by mouth 2 (two) times daily. Derm    . aspirin 81 MG tablet Take 1 tablet by mouth daily.    . carbamide peroxide (DEBROX) 6.5 % OTIC solution Place 5 drops into the right ear 2 (two) times daily. 15 mL 0  . Cholecalciferol (VITAMIN D) 2000 units tablet Take 2,000 Units by mouth daily.    . DELZICOL 400 MG CPDR DR capsule Take 2 tablets by mouth 2 (two) times daily. GI Dr  3  . lansoprazole (PREVACID) 30 MG capsule Take 1 capsule by mouth daily. GI Doc    . nystatin-triamcinolone (MYCOLOG II) cream Apply 1 application topically 2 (two) times daily. PRN/ Derm    . lisinopril-hydrochlorothiazide (PRINZIDE,ZESTORETIC) 10-12.5 MG tablet TAKE 1 TABLET BY MOUTH ONCE DAILY 90 tablet 0  . doxycycline (VIBRA-TABS) 100 MG tablet Take 1 tablet (100 mg total) by mouth 2 (two) times daily. 20 tablet 0   Facility-Administered Medications Prior to Visit  Medication Dose Route Frequency Provider Last Rate Last Dose  . ipratropium-albuterol (DUONEB) 0.5-2.5 (3) MG/3ML nebulizer solution 3 mL  3 mL Nebulization Once Juline Patch, MD        Review of Systems  Constitutional: Negative for chills, diaphoresis, fever, malaise/fatigue and weight loss.  HENT: Negative for ear  discharge, ear pain and sore throat.   Eyes: Negative for blurred vision.  Respiratory: Positive for shortness of breath. Negative for cough, sputum production and wheezing.   Cardiovascular: Positive for chest pain and palpitations. Negative for orthopnea, claudication, leg swelling and PND.  Gastrointestinal: Negative for abdominal pain, blood in stool, constipation, diarrhea, heartburn, melena and nausea.  Genitourinary: Negative for dysuria, frequency, hematuria and urgency.  Musculoskeletal: Negative for back pain, joint pain, myalgias and neck pain.  Skin: Negative for rash.  Neurological: Negative for dizziness, tingling, sensory change, focal weakness and headaches.  Endo/Heme/Allergies: Negative for environmental allergies and polydipsia. Does not bruise/bleed easily.  Psychiatric/Behavioral: Negative for depression and suicidal ideas. The patient is not nervous/anxious and does not have insomnia.      Objective  Vitals:   08/23/17 0928  BP: 120/80  Pulse: 76  Weight: 223 lb (101.2 kg)  Height: 5\' 6"  (1.676 m)    Physical Exam  Constitutional: She is  well-developed, well-nourished, and in no distress. No distress.  HENT:  Head: Normocephalic and atraumatic.  Right Ear: External ear normal.  Left Ear: External ear normal.  Nose: Nose normal.  Mouth/Throat: Oropharynx is clear and moist.  Eyes: Conjunctivae and EOM are normal. Pupils are equal, round, and reactive to light. Right eye exhibits no discharge. Left eye exhibits no discharge.  Neck: Normal range of motion. Neck supple. No JVD present. No thyromegaly present.  Cardiovascular: Normal rate, regular rhythm, normal heart sounds and intact distal pulses. Exam reveals no gallop and no friction rub.  No murmur heard. Pulmonary/Chest: Effort normal and breath sounds normal. She has no wheezes. She has no rales. She exhibits no bony tenderness.  Abdominal: Soft. Bowel sounds are normal. She exhibits no mass. There is no  tenderness. There is no guarding.  Musculoskeletal: Normal range of motion. She exhibits no edema.  Lymphadenopathy:    She has no cervical adenopathy.  Neurological: She is alert. She has normal reflexes.  Skin: Skin is warm and dry. She is not diaphoretic.  Psychiatric: Mood and affect normal.  Nursing note and vitals reviewed.     Assessment & Plan  Problem List Items Addressed This Visit      Cardiovascular and Mediastinum   Essential (primary) hypertension   Relevant Medications   lisinopril-hydrochlorothiazide (PRINZIDE,ZESTORETIC) 10-12.5 MG tablet   Other Relevant Orders   Renal function panel   Aortic atherosclerosis (HCC)   Relevant Medications   lisinopril-hydrochlorothiazide (PRINZIDE,ZESTORETIC) 10-12.5 MG tablet     Other   Pure hypercholesterolemia   Relevant Medications   lisinopril-hydrochlorothiazide (PRINZIDE,ZESTORETIC) 10-12.5 MG tablet   Other Relevant Orders   Lipid panel    Other Visit Diagnoses    Chest pain, unspecified type    -  Primary   Relevant Orders   EKG 12-Lead (Completed)   Ambulatory referral to Cardiology      Meds ordered this encounter  Medications  . lisinopril-hydrochlorothiazide (PRINZIDE,ZESTORETIC) 10-12.5 MG tablet    Sig: Take 1 tablet by mouth daily.    Dispense:  90 tablet    Refill:  1      Dr. Otilio [facility] [id number] [facility]  08/23/17

## 2017-08-24 LAB — LIPID PANEL
CHOL/HDL RATIO: 4.6 ratio — AB (ref 0.0–4.4)
Cholesterol, Total: 255 mg/dL — ABNORMAL HIGH (ref 100–199)
HDL: 55 mg/dL (ref >39)
LDL CALC: 161 mg/dL — AB (ref 0–99)
Triglycerides: 196 mg/dL — ABNORMAL HIGH (ref 0–149)
VLDL Cholesterol Cal: 39 mg/dL (ref 5–40)

## 2017-08-24 LAB — RENAL FUNCTION PANEL
Albumin: 4.2 g/dL (ref 3.6–4.8)
BUN / CREAT RATIO: 15 (ref 12–28)
BUN: 12 mg/dL (ref 8–27)
CALCIUM: 9.5 mg/dL (ref 8.7–10.3)
CO2: 22 mmol/L (ref 20–29)
CREATININE: 0.79 mg/dL (ref 0.57–1.00)
Chloride: 100 mmol/L (ref 96–106)
GFR calc Af Amer: 92 mL/min/{1.73_m2} (ref >59)
GFR, EST NON AFRICAN AMERICAN: 80 mL/min/{1.73_m2} (ref >59)
GLUCOSE: 88 mg/dL (ref 65–99)
PHOSPHORUS: 3.6 mg/dL (ref 2.5–4.5)
POTASSIUM: 4.4 mmol/L (ref 3.5–5.2)
SODIUM: 141 mmol/L (ref 134–144)

## 2017-09-01 DIAGNOSIS — R0789 Other chest pain: Secondary | ICD-10-CM | POA: Insufficient documentation

## 2017-09-29 ENCOUNTER — Encounter: Payer: BLUE CROSS/BLUE SHIELD | Admitting: Obstetrics and Gynecology

## 2017-10-06 ENCOUNTER — Ambulatory Visit (INDEPENDENT_AMBULATORY_CARE_PROVIDER_SITE_OTHER): Payer: BLUE CROSS/BLUE SHIELD | Admitting: Obstetrics and Gynecology

## 2017-10-06 ENCOUNTER — Encounter: Payer: Self-pay | Admitting: Obstetrics and Gynecology

## 2017-10-06 VITALS — BP 133/85 | HR 69 | Ht 66.0 in | Wt 226.5 lb

## 2017-10-06 DIAGNOSIS — Z8541 Personal history of malignant neoplasm of cervix uteri: Secondary | ICD-10-CM | POA: Diagnosis not present

## 2017-10-06 DIAGNOSIS — Z0000 Encounter for general adult medical examination without abnormal findings: Secondary | ICD-10-CM

## 2017-10-06 DIAGNOSIS — Z808 Family history of malignant neoplasm of other organs or systems: Secondary | ICD-10-CM

## 2017-10-06 DIAGNOSIS — Z809 Family history of malignant neoplasm, unspecified: Secondary | ICD-10-CM

## 2017-10-06 NOTE — Progress Notes (Signed)
HPI:      [name] is a 62 y.o. G1P1001 who LMP was No LMP recorded. Patient has had a hysterectomy.  Subjective:   She presents today for her annual examination.  She reports that she is doing well.  She has had a recent cholesterol and it remains high.  She has begun Crestor with her general physician. She is scheduled for mammogram in May.  She reports that she receives colonoscopies approximately every 3 years as her mother had colon cancer. The patient's personal history is significant for cervical cancer.-She desires vaginal cuff Paps yearly.     Hx: The following portions of the patient's history were reviewed and updated as appropriate:             She  has a past medical history of Cervical cancer ([place]), Diverticulosis, GERD (gastroesophageal reflux disease), Hemorrhoids, History of colon polyps, Hyperlipemia, Hypertension, and Vitamin D deficiency. She does not have any pertinent problems on file. She  has a past surgical history that includes Gallbladder surgery (06/04/2014); Cholecystectomy; Eye surgery; Abdominal hysterectomy; Diagnostic laparoscopy; Colonoscopy with propofol (N/A, 06/16/2016); and Breast biopsy (Right, 10 plus yrs ago). Her family history includes Colon cancer in her mother. She  reports that she has quit smoking. She has never used smokeless tobacco. She reports that she drinks about 0.6 oz of alcohol per week. She reports that she does not use drugs. She has a current medication list which includes the following prescription(s): albuterol, apremilast, aspirin, carbamide peroxide, vitamin d, delzicol, lansoprazole, lisinopril-hydrochlorothiazide, and nystatin-triamcinolone, and the following Facility-Administered Medications: ipratropium-albuterol. She is allergic to lipitor  [atorvastatin]; metronidazole; and nsaids.       Review of Systems:  Review of Systems  Constitutional: Denied constitutional symptoms, night sweats, recent illness, fatigue,  fever, insomnia and weight loss.  Eyes: Denied eye symptoms, eye pain, photophobia, vision change and visual disturbance.  Ears/Nose/Throat/Neck: Denied ear, nose, throat or neck symptoms, hearing loss, nasal discharge, sinus congestion and sore throat.  Cardiovascular: Denied cardiovascular symptoms, arrhythmia, chest pain/pressure, edema, exercise intolerance, orthopnea and palpitations.  Respiratory: Denied pulmonary symptoms, asthma, pleuritic pain, productive sputum, cough, dyspnea and wheezing.  Gastrointestinal: Denied, gastro-esophageal reflux, melena, nausea and vomiting.  Genitourinary: Denied genitourinary symptoms including symptomatic vaginal discharge, pelvic relaxation issues, and urinary complaints.  Musculoskeletal: Denied musculoskeletal symptoms, stiffness, swelling, muscle weakness and myalgia.  Dermatologic: Denied dermatology symptoms, rash and scar.  Neurologic: Denied neurology symptoms, dizziness, headache, neck pain and syncope.  Psychiatric: Denied psychiatric symptoms, anxiety and depression.  Endocrine: Denied endocrine symptoms including hot flashes and night sweats.   Meds:   Current Outpatient Medications on File Prior to Visit  Medication Sig Dispense Refill  . albuterol (PROVENTIL HFA;VENTOLIN HFA) 108 (90 Base) MCG/ACT inhaler Inhale 2 puffs into the lungs every 6 (six) hours as needed for wheezing or shortness of breath. 1 Inhaler 0  . Apremilast 30 MG TABS Take 1 tablet by mouth 2 (two) times daily. Derm    . aspirin 81 MG tablet Take 1 tablet by mouth daily.    . carbamide peroxide (DEBROX) 6.5 % OTIC solution Place 5 drops into the right ear 2 (two) times daily. 15 mL 0  . Cholecalciferol (VITAMIN D) 2000 units tablet Take 2,000 Units by mouth daily.    . DELZICOL 400 MG CPDR DR capsule Take 2 tablets by mouth 2 (two) times daily. GI Dr  3  . lansoprazole (PREVACID) 30 MG capsule Take 1 capsule by mouth daily. GI  Doc    . lisinopril-hydrochlorothiazide  (PRINZIDE,ZESTORETIC) 10-12.5 MG tablet Take 1 tablet by mouth daily. 90 tablet 1  . nystatin-triamcinolone (MYCOLOG II) cream Apply 1 application topically 2 (two) times daily. PRN/ Derm     Current Facility-Administered Medications on File Prior to Visit  Medication Dose Route Frequency Provider Last Rate Last Dose  . ipratropium-albuterol (DUONEB) 0.5-2.5 (3) MG/3ML nebulizer solution 3 mL  3 mL Nebulization Once Juline Patch, MD        Objective:     Vitals:   10/06/17 1016  BP: 133/85  Pulse: 69              Physical examination General NAD, Conversant  HEENT Atraumatic; Op clear with mmm.  Normo-cephalic. Pupils reactive. Anicteric sclerae  Thyroid/Neck Smooth without nodularity or enlargement. Normal ROM.  Neck Supple.  Skin No rashes, lesions or ulceration. Normal palpated skin turgor. No nodularity.  Breasts: No masses or discharge.  Symmetric.  No axillary adenopathy.  Lungs: Clear to auscultation.No rales or wheezes. Normal Respiratory effort, no retractions.  Heart: NSR.  No murmurs or rubs appreciated. No periferal edema  Abdomen: Soft.  Non-tender.  No masses.  No HSM. No hernia  Extremities: Moves all appropriately.  Normal ROM for age. No lymphadenopathy.  Neuro: Oriented to PPT.  Normal mood. Normal affect.     Pelvic:   Vulva: Normal appearance.  No lesions.  Vagina: No lesions or abnormalities noted.  Support: Normal pelvic support.  Urethra No masses tenderness or scarring.  Meatus Normal size without lesions or prolapse.  Cervix:  Surgically absent  Anus: Normal exam.  No lesions.  Perineum: Normal exam.  No lesions.        Bimanual   Uterus:  Surgically absent  Adnexae: No masses.  Non-tender to palpation.  Cul-de-sac: Negative for abnormality.      Assessment:    G1P1001 Patient Active Problem List   Diagnosis Date Noted  . Class 2 obesity due to excess calories without serious comorbidity with body mass index (BMI) of 37.0 to 37.9 in adult  02/16/2017  . Pure hypercholesterolemia 02/16/2017  . Aortic atherosclerosis ([place]) 05/25/2016  . Steatosis of liver 05/25/2016  . Mixed hyperlipidemia 05/25/2016  . Essential (primary) hypertension 10/28/2014  . Gastro-esophageal reflux disease without esophagitis 10/28/2014  . H/O psoriasis 10/28/2014  . Decreased potassium in the blood 10/28/2014  . Familial multiple lipoprotein-type hyperlipidemia 10/28/2014     1. Encounter for annual physical exam   2. Family history of cancer in mother   26. History of cervical cancer     Patient does a good job regarding routine medical screenings.   Plan:            1.  Basic Screening Recommendations The basic screening recommendations for asymptomatic women were discussed with the patient during her visit.  The age-appropriate recommendations were discussed with her and the rational for the tests reviewed.  When I am informed by the patient that another primary care physician has previously obtained the age-appropriate tests and they are up-to-date, only outstanding tests are ordered and referrals given as necessary.  Abnormal results of tests will be discussed with her when all of her results are completed. I have discussed genetic testing with her and she is very interested in having this drawn today.  In VTA performed. Vaginal cuff Pap performed. Orders No orders of the defined types were placed in this encounter.   No orders of the defined types were placed  in this encounter.       F/U  Return in about 1 year (around 10/07/2018) for Annual Physical, We will contact her with any abnormal test results.  Finis Bud, M.D. 10/06/2017 11:01 AM

## 2017-10-06 NOTE — Addendum Note (Signed)
Addended by: Elouise Munroe on: 10/06/2017 03:21 PM   Modules accepted: Orders

## 2017-10-06 NOTE — Progress Notes (Signed)
Pt is doing well denies any itching/burning no other concerns. Marland Kitchen

## 2017-10-10 ENCOUNTER — Other Ambulatory Visit: Payer: Self-pay | Admitting: Obstetrics and Gynecology

## 2017-10-10 DIAGNOSIS — Z1231 Encounter for screening mammogram for malignant neoplasm of breast: Secondary | ICD-10-CM

## 2017-10-12 LAB — IGP, COBASHPV16/18
HPV 16: NEGATIVE
HPV 18: NEGATIVE
HPV other hr types: NEGATIVE
PAP Smear Comment: 0

## 2017-11-01 ENCOUNTER — Telehealth: Payer: Self-pay

## 2017-11-01 NOTE — Telephone Encounter (Signed)
Pt aware of [facility] results. Email and web site given for genetic counselors.

## 2017-11-09 ENCOUNTER — Ambulatory Visit
Admission: RE | Admit: 2017-11-09 | Discharge: 2017-11-09 | Disposition: A | Discharge status: Home / Self Care | Payer: BLUE CROSS/BLUE SHIELD | Source: Ambulatory Visit | Attending: Obstetrics and Gynecology | Admitting: Obstetrics and Gynecology

## 2017-11-09 ENCOUNTER — Encounter (INDEPENDENT_AMBULATORY_CARE_PROVIDER_SITE_OTHER): Payer: Self-pay

## 2017-11-09 DIAGNOSIS — Z1231 Encounter for screening mammogram for malignant neoplasm of breast: Secondary | ICD-10-CM | POA: Diagnosis not present

## 2018-02-21 ENCOUNTER — Ambulatory Visit
Admission: EM | Admit: 2018-02-21 | Discharge: 2018-02-21 | Disposition: A | Discharge status: Home / Self Care | Payer: BLUE CROSS/BLUE SHIELD | Attending: Family Medicine | Admitting: Family Medicine

## 2018-02-21 ENCOUNTER — Other Ambulatory Visit: Payer: Self-pay

## 2018-02-21 ENCOUNTER — Encounter: Payer: Self-pay | Admitting: Emergency Medicine

## 2018-02-21 DIAGNOSIS — Z886 Allergy status to analgesic agent status: Secondary | ICD-10-CM | POA: Insufficient documentation

## 2018-02-21 DIAGNOSIS — Z9049 Acquired absence of other specified parts of digestive tract: Secondary | ICD-10-CM | POA: Diagnosis not present

## 2018-02-21 DIAGNOSIS — I10 Essential (primary) hypertension: Secondary | ICD-10-CM | POA: Insufficient documentation

## 2018-02-21 DIAGNOSIS — Z79899 Other long term (current) drug therapy: Secondary | ICD-10-CM | POA: Diagnosis not present

## 2018-02-21 DIAGNOSIS — K219 Gastro-esophageal reflux disease without esophagitis: Secondary | ICD-10-CM | POA: Insufficient documentation

## 2018-02-21 DIAGNOSIS — Z7982 Long term (current) use of aspirin: Secondary | ICD-10-CM | POA: Insufficient documentation

## 2018-02-21 DIAGNOSIS — E7800 Pure hypercholesterolemia, unspecified: Secondary | ICD-10-CM | POA: Diagnosis not present

## 2018-02-21 DIAGNOSIS — R9431 Abnormal electrocardiogram [ECG] [EKG]: Secondary | ICD-10-CM | POA: Insufficient documentation

## 2018-02-21 DIAGNOSIS — R42 Dizziness and giddiness: Secondary | ICD-10-CM | POA: Insufficient documentation

## 2018-02-21 DIAGNOSIS — Z87891 Personal history of nicotine dependence: Secondary | ICD-10-CM | POA: Insufficient documentation

## 2018-02-21 DIAGNOSIS — Z881 Allergy status to other antibiotic agents status: Secondary | ICD-10-CM | POA: Diagnosis not present

## 2018-02-21 DIAGNOSIS — E559 Vitamin D deficiency, unspecified: Secondary | ICD-10-CM | POA: Insufficient documentation

## 2018-02-21 DIAGNOSIS — Z8541 Personal history of malignant neoplasm of cervix uteri: Secondary | ICD-10-CM | POA: Insufficient documentation

## 2018-02-21 DIAGNOSIS — R0602 Shortness of breath: Secondary | ICD-10-CM | POA: Diagnosis not present

## 2018-02-21 LAB — GLUCOSE, CAPILLARY: Glucose-Capillary: 108 mg/dL — ABNORMAL HIGH (ref 70–99)

## 2018-02-21 LAB — COMPREHENSIVE METABOLIC PANEL
ALBUMIN: 4.2 g/dL (ref 3.5–5.0)
ALK PHOS: 59 U/L (ref 38–126)
ALT: 23 U/L (ref 0–44)
AST: 28 U/L (ref 15–41)
Anion gap: 13 (ref 5–15)
BILIRUBIN TOTAL: 0.5 mg/dL (ref 0.3–1.2)
BUN: 12 mg/dL (ref 8–23)
CHLORIDE: 104 mmol/L (ref 98–111)
CO2: 23 mmol/L (ref 22–32)
Calcium: 8.8 mg/dL — ABNORMAL LOW (ref 8.9–10.3)
Creatinine, Ser: 0.71 mg/dL (ref 0.44–1.00)
GFR calc Af Amer: >60 mL/min (ref >60)
Glucose, Bld: 122 mg/dL — ABNORMAL HIGH (ref 70–99)
POTASSIUM: 3.9 mmol/L (ref 3.5–5.1)
Sodium: 140 mmol/L (ref 135–145)
Total Protein: 7.3 g/dL (ref 6.5–8.1)

## 2018-02-21 MED ORDER — MECLIZINE HCL 25 MG PO TABS: 25.0000 mg | ORAL_TABLET | Freq: Three times a day (TID) | ORAL | 0 refills | Status: DC | PRN

## 2018-02-21 NOTE — Discharge Instructions (Signed)
Rest, fluids.  Cardiology as needed.  Take care  Dr. Lacinda Axon

## 2018-02-21 NOTE — ED Provider Notes (Signed)
[facility]    CSN: [id number] Arrival date & time: 02/21/18  1258     History   Chief Complaint Chief Complaint  Patient presents with  . Dizziness   HPI  62 year old female presents with the above complaint.  Patient states that she felt dizzy this morning.  She states that she took nondrowsy Dramamine.  She subsequently went to [facility] and then was traveling back when she felt worse.  She reports persistent lightheadedness/dizziness, shortness of breath, feeling "shaky".  Patient states that she looked at her watch and it told her heart rate was 127.  Patient states that she just does not feel well.  Patient has difficulty describing the dizziness.  She describes it as "euphoria".  Patient feeling slightly improved currently.  No known inciting factor.  No known relieving factors.  No chest pain.  Patient had a normal stress echo in March of this year.  No other associated symptoms.  No other complaints.  Past Medical History:  Diagnosis Date  . Cervical cancer [facility])    age 72  . Diverticulosis   . GERD (gastroesophageal reflux disease)   . Hemorrhoids   . History of colon polyps   . Hyperlipemia   . Hypertension   . Vitamin D deficiency     Patient Active Problem List   Diagnosis Date Noted  . Class 2 obesity due to excess calories without serious comorbidity with body mass index (BMI) of 37.0 to 37.9 in adult 02/16/2017  . Pure hypercholesterolemia 02/16/2017  . Aortic atherosclerosis ([place]) 05/25/2016  . Steatosis of liver 05/25/2016  . Mixed hyperlipidemia 05/25/2016  . Essential (primary) hypertension 10/28/2014  . Gastro-esophageal reflux disease without esophagitis 10/28/2014  . H/O psoriasis 10/28/2014  . Decreased potassium in the blood 10/28/2014  . Familial multiple lipoprotein-type hyperlipidemia 10/28/2014    Past Surgical History:  Procedure Laterality Date  . ABDOMINAL HYSTERECTOMY    . BREAST BIOPSY Right 10 plus yrs ago   stereo  bx./clip. Benign  . CHOLECYSTECTOMY    . COLONOSCOPY WITH PROPOFOL N/A 06/16/2016   Procedure: COLONOSCOPY WITH PROPOFOL;  Surgeon: Manya Silvas, MD;  Location: [facility] ENDOSCOPY;  Service: Endoscopy;  Laterality: N/A;  . DIAGNOSTIC LAPAROSCOPY    . EYE SURGERY    . GALLBLADDER SURGERY  06/04/2014    OB History    Gravida  1   Para  1   Term  1   Preterm      AB      Living  1     SAB      TAB      Ectopic      Multiple      Live Births  1            Home Medications    Prior to Admission medications   Medication Sig Start Date End Date Taking? Authorizing Provider  Apremilast 30 MG TABS Take 1 tablet by mouth 2 (two) times daily. Derm   Yes [provider]  aspirin 81 MG tablet Take 1 tablet by mouth daily.   Yes [provider]  carbamide peroxide (DEBROX) 6.5 % OTIC solution Place 5 drops into the right ear 2 (two) times daily. 07/19/17  Yes Juline Patch, MD  Cholecalciferol (VITAMIN D) 2000 units tablet Take 2,000 Units by mouth daily.   Yes [provider]  DELZICOL 400 MG CPDR DR capsule Take 2 tablets by mouth 2 (two) times daily. GI Dr 12/20/16  Yes  [provider]  lansoprazole (PREVACID) 30 MG capsule Take 1 capsule by mouth daily. GI Doc 10/22/14  Yes [provider]  lisinopril-hydrochlorothiazide (PRINZIDE,ZESTORETIC) 10-12.5 MG tablet Take 1 tablet by mouth daily. 08/23/17  Yes Juline Patch, MD  nystatin-triamcinolone (MYCOLOG II) cream Apply 1 application topically 2 (two) times daily. PRN/ Derm 10/09/13  Yes [provider]  rosuvastatin (CRESTOR) 10 MG tablet Take by mouth. 09/20/17 09/20/18 Yes [provider]  meclizine (ANTIVERT) 25 MG tablet Take 1 tablet (25 mg total) by mouth 3 (three) times daily as needed for dizziness. 02/21/18   Coral Spikes, DO    Family History Family History  Problem Relation Age of Onset  . Colon cancer Mother   . Diabetes Father   . Breast cancer  Neg Hx     Social History Social History   Tobacco Use  . Smoking status: Former Research scientist (life sciences)  . Smokeless tobacco: Never Used  Substance Use Topics  . Alcohol use: Yes    Alcohol/week: 1.0 standard drinks    Types: 1 Cans of beer per week  . Drug use: No     Allergies   Lipitor  [atorvastatin]; Metronidazole; and Nsaids   Review of Systems Review of Systems  Respiratory: Positive for shortness of breath.   Cardiovascular: Negative for chest pain.  Neurological: Positive for dizziness and light-headedness.   Physical Exam Triage Vital Signs ED Triage Vitals  Enc Vitals Group     BP 02/21/18 1305 (!) 165/94     Pulse Rate 02/21/18 1305 (!) 102     Resp 02/21/18 1305 20     Temp 02/21/18 1305 97.8 F (36.6 C)     Temp Source 02/21/18 1305 Oral     SpO2 02/21/18 1305 100 %     Weight 02/21/18 1306 216 lb (98 kg)     Height 02/21/18 1306 5\' 6"  (1.676 m)     Head Circumference --      Peak Flow --      Pain Score 02/21/18 1306 0     Pain Loc --      Pain Edu? --      Excl. in [place]? --    Updated Vital Signs BP (!) 165/94 (BP Location: Right Arm)   Pulse (!) 102   Temp 97.8 F (36.6 C) (Oral)   Resp 20   Ht 5\' 6"  (1.676 m)   Wt 98 kg   SpO2 100%   BMI 34.86 kg/m   Visual Acuity Right Eye Distance:   Left Eye Distance:   Bilateral Distance:    Right Eye Near:   Left Eye Near:    Bilateral Near:     Physical Exam  Constitutional: She is oriented to person, place, and time. She appears well-developed. No distress.  HENT:  Head: Normocephalic and atraumatic.  Cardiovascular:  Tachycardia. Regular rhythm.  Pulmonary/Chest: Effort normal and breath sounds normal. She has no wheezes. She has no rales.  Abdominal: Soft. She exhibits no distension. There is no tenderness.  Neurological: She is alert and oriented to person, place, and time.  Psychiatric: She has a normal mood and affect. Her behavior is normal.  Nursing note and vitals reviewed.  UC Treatments  / Results  Labs (all labs ordered are listed, but only abnormal results are displayed) Labs Reviewed  GLUCOSE, CAPILLARY - Abnormal; Notable for the following components:      Result Value   Glucose-Capillary 108 (*)    All other components  within normal limits  COMPREHENSIVE METABOLIC PANEL - Abnormal; Notable for the following components:   Glucose, Bld 122 (*)    Calcium 8.8 (*)    All other components within normal limits  CBG MONITORING, ED    EKG Interpretation: Normal sinus rhythm with a rate of 91.  No appreciable ST abnormalities.  Normal intervals.  Unremarkable EKG.  Radiology No results found.  Procedures Procedures (including critical care time)  Medications Ordered in UC Medications - No data to display  Initial Impression / Assessment and Plan / UC Course  I have reviewed the triage vital signs and the nursing notes.  Pertinent labs & imaging results that were available during my care of the patient were reviewed by me and considered in my medical decision making (see chart for details).    62 year old female presents with dizziness, shortness of breath, "shakiness".  Metabolic panel normal.  Patient had a stress echocardiogram earlier this year which was normal.  EKG unremarkable today.  I have advised her to rest.  Meclizine as needed.  If persists, recommend seeing cardiology for consideration for Holter monitor.  Final Clinical Impressions(s) / UC Diagnoses   Final diagnoses:  Dizziness     Discharge Instructions     Rest, fluids.  Cardiology as needed.  Take care  Dr. Lacinda Axon     ED Prescriptions    Medication Sig Dispense Auth. Provider   meclizine (ANTIVERT) 25 MG tablet Take 1 tablet (25 mg total) by mouth 3 (three) times daily as needed for dizziness. 30 tablet Coral Spikes, DO     Controlled Substance Prescriptions [place] Controlled Substance Registry consulted? Not Applicable   Coral Spikes, DO 02/21/18 1417

## 2018-02-21 NOTE — ED Triage Notes (Addendum)
Patient states she started having dizziness this morning on her way back from [place]. States she took a Dramamine this morning. States while driving she became light headed and felt as if she was going to pass out. She stated she looked at her Apple watch and her heart rate was 127. She drove here to [facility] and now feels shaky. Denies chest pain. + shortness of breath and abdominal "tightness."  Patient also has redness to her right eye that she did not notice this morning.

## 2018-04-17 ENCOUNTER — Encounter: Payer: Self-pay | Admitting: Family Medicine

## 2018-04-17 ENCOUNTER — Ambulatory Visit: Payer: BLUE CROSS/BLUE SHIELD | Admitting: Family Medicine

## 2018-04-17 VITALS — BP 110/72 | HR 72 | Ht 66.0 in | Wt 219.0 lb

## 2018-04-17 DIAGNOSIS — I10 Essential (primary) hypertension: Secondary | ICD-10-CM

## 2018-04-17 DIAGNOSIS — H8309 Labyrinthitis, unspecified ear: Secondary | ICD-10-CM | POA: Diagnosis not present

## 2018-04-17 DIAGNOSIS — J0100 Acute maxillary sinusitis, unspecified: Secondary | ICD-10-CM

## 2018-04-17 DIAGNOSIS — Z23 Encounter for immunization: Secondary | ICD-10-CM

## 2018-04-17 DIAGNOSIS — J40 Bronchitis, not specified as acute or chronic: Secondary | ICD-10-CM | POA: Diagnosis not present

## 2018-04-17 MED ORDER — NYSTATIN-TRIAMCINOLONE 100000-0.1 UNIT/GM-% EX CREA
1.0000 | TOPICAL_CREAM | Freq: Two times a day (BID) | CUTANEOUS | 1 refills | Status: DC
Start: 2018-04-17 — End: 2019-10-24

## 2018-04-17 MED ORDER — AZITHROMYCIN 250 MG PO TABS: ORAL_TABLET | ORAL | 0 refills | Status: DC

## 2018-04-17 MED ORDER — MECLIZINE HCL 25 MG PO TABS: 25.0000 mg | ORAL_TABLET | Freq: Three times a day (TID) | ORAL | 0 refills | Status: DC | PRN

## 2018-04-17 MED ORDER — GUAIFENESIN-CODEINE 100-10 MG/5ML PO SYRP: 5.0000 mL | ORAL_SOLUTION | Freq: Three times a day (TID) | ORAL | 0 refills | Status: DC | PRN

## 2018-04-17 MED ORDER — LISINOPRIL-HYDROCHLOROTHIAZIDE 10-12.5 MG PO TABS: 1.0000 | ORAL_TABLET | Freq: Every day | ORAL | 1 refills | Status: DC

## 2018-04-17 NOTE — Progress Notes (Signed)
Date:  04/17/2018   Name:  Theresa Gross   DOB:  1956/03/04   MRN:  [id number]   Chief Complaint: Sinusitis (cough from drainage, wheezing at night, ); Hypertension; and Dizziness (refill meclizine)  Sinusitis  This is a new problem. The current episode started in the past 7 days (Friday). The problem has been gradually worsening since onset. There has been no fever. Her pain is at a severity of 3/10. Associated symptoms include congestion, coughing, headaches, shortness of breath and sinus pressure. Pertinent negatives include no chills, diaphoresis, ear pain, hoarse voice, neck pain, sneezing, sore throat or swollen glands. Past treatments include oral decongestants. The treatment provided moderate relief.  Dizziness  This is a recurrent problem. The current episode started more than 1 month ago. The problem occurs daily. Associated symptoms include congestion, coughing and headaches. Pertinent negatives include no abdominal pain, anorexia, arthralgias, change in bowel habit, chest pain, chills, diaphoresis, fatigue, fever, joint swelling, myalgias, nausea, neck pain, numbness, rash, sore throat, swollen glands, urinary symptoms, vertigo, visual change, vomiting or weakness. Nothing aggravates the symptoms. Treatments tried: meclizine.  Hypertension  This is a chronic problem. The current episode started more than 1 year ago. The problem is unchanged. Associated symptoms include anxiety, blurred vision, headaches and shortness of breath. Pertinent negatives include no chest pain, malaise/fatigue, neck pain, orthopnea, palpitations, peripheral edema, PND or sweats. There are no associated agents to hypertension. Risk factors for coronary artery disease include dyslipidemia, post-menopausal state and smoking/tobacco exposure. Past treatments include ACE inhibitors and diuretics. There are no compliance problems.  There is no history of angina, kidney disease, CAD/MI, CVA, heart failure, left  ventricular hypertrophy, PVD or retinopathy. There is no history of chronic renal disease, a hypertension causing med or renovascular disease.    Review of Systems  Constitutional: Negative.  Negative for chills, diaphoresis, fatigue, fever, malaise/fatigue and unexpected weight change.  HENT: Positive for congestion and sinus pressure. Negative for ear discharge, ear pain, hoarse voice, rhinorrhea, sneezing and sore throat.   Eyes: Positive for blurred vision. Negative for photophobia, pain, discharge, redness and itching.  Respiratory: Positive for cough and shortness of breath. Negative for wheezing and stridor.   Cardiovascular: Negative for chest pain, palpitations, orthopnea and PND.  Gastrointestinal: Negative for abdominal pain, anorexia, blood in stool, change in bowel habit, constipation, diarrhea, nausea and vomiting.  Endocrine: Negative for cold intolerance, heat intolerance, polydipsia, polyphagia and polyuria.  Genitourinary: Negative for dysuria, flank pain, frequency, hematuria, menstrual problem, pelvic pain, urgency, vaginal bleeding and vaginal discharge.  Musculoskeletal: Negative for arthralgias, back pain, joint swelling, myalgias and neck pain.  Skin: Negative for rash.  Allergic/Immunologic: Negative for environmental allergies and food allergies.  Neurological: Positive for dizziness and headaches. Negative for vertigo, weakness, light-headedness and numbness.  Hematological: Negative for adenopathy. Does not bruise/bleed easily.  Psychiatric/Behavioral: Negative for dysphoric mood. The patient is not nervous/anxious.     Patient Active Problem List   Diagnosis Date Noted  . Class 2 obesity due to excess calories without serious comorbidity with body mass index (BMI) of 37.0 to 37.9 in adult 02/16/2017  . Pure hypercholesterolemia 02/16/2017  . Aortic atherosclerosis ([place]) 05/25/2016  . Steatosis of liver 05/25/2016  . Mixed hyperlipidemia 05/25/2016  . Essential  (primary) hypertension 10/28/2014  . Gastro-esophageal reflux disease without esophagitis 10/28/2014  . H/O psoriasis 10/28/2014  . Decreased potassium in the blood 10/28/2014  . Familial multiple lipoprotein-type hyperlipidemia 10/28/2014    Allergies  Allergen Reactions  .  Lipitor  [Atorvastatin]   . Metronidazole   . Nsaids Other (See Comments)    Have to be careful due to liver    Past Surgical History:  Procedure Laterality Date  . ABDOMINAL HYSTERECTOMY    . BREAST BIOPSY Right 10 plus yrs ago   stereo bx./clip. Benign  . CHOLECYSTECTOMY    . COLONOSCOPY WITH PROPOFOL N/A 06/16/2016   Procedure: COLONOSCOPY WITH PROPOFOL;  Surgeon: Manya Silvas, MD;  Location: [facility] ENDOSCOPY;  Service: Endoscopy;  Laterality: N/A;  . DIAGNOSTIC LAPAROSCOPY    . EYE SURGERY    . GALLBLADDER SURGERY  06/04/2014    Social History   Tobacco Use  . Smoking status: Former Research scientist (life sciences)  . Smokeless tobacco: Never Used  Substance Use Topics  . Alcohol use: Yes    Alcohol/week: 1.0 standard drinks    Types: 1 Cans of beer per week  . Drug use: No     Medication list has been reviewed and updated.  Current Meds  Medication Sig  . Apremilast 30 MG TABS Take 1 tablet by mouth 2 (two) times daily. Derm  . aspirin 81 MG tablet Take 1 tablet by mouth daily.  . carbamide peroxide (DEBROX) 6.5 % OTIC solution Place 5 drops into the right ear 2 (two) times daily.  . Cholecalciferol (VITAMIN D) 2000 units tablet Take 2,000 Units by mouth daily.  . DELZICOL 400 MG CPDR DR capsule Take 2 tablets by mouth 2 (two) times daily. GI Dr  . lansoprazole (PREVACID) 30 MG capsule Take 1 capsule by mouth daily. GI Doc  . lisinopril-hydrochlorothiazide (PRINZIDE,ZESTORETIC) 10-12.5 MG tablet Take 1 tablet by mouth daily.  . meclizine (ANTIVERT) 25 MG tablet Take 1 tablet (25 mg total) by mouth 3 (three) times daily as needed for dizziness.  . nystatin-triamcinolone (MYCOLOG II) cream Apply 1 application  topically 2 (two) times daily. PRN/ Derm  . rosuvastatin (CRESTOR) 10 MG tablet Take by mouth.  . [DISCONTINUED] lisinopril-hydrochlorothiazide (PRINZIDE,ZESTORETIC) 10-12.5 MG tablet Take 1 tablet by mouth daily.  . [DISCONTINUED] meclizine (ANTIVERT) 25 MG tablet Take 1 tablet (25 mg total) by mouth 3 (three) times daily as needed for dizziness.  . [DISCONTINUED] nystatin-triamcinolone (MYCOLOG II) cream Apply 1 application topically 2 (two) times daily. PRN/ Derm   Current Facility-Administered Medications for the 04/17/18 encounter (Office Visit) with Juline Patch, MD  Medication  . ipratropium-albuterol (DUONEB) 0.5-2.5 (3) MG/3ML nebulizer solution 3 mL    PHQ 2/9 Scores 04/17/2018 02/16/2017 11/19/2015 10/22/2015  PHQ - 2 Score 0 0 0 0  PHQ- 9 Score 0 0 - -    Physical Exam  Constitutional: She is oriented to person, place, and time. She appears well-developed and well-nourished.  HENT:  Head: Normocephalic.  Right Ear: External ear normal.  Left Ear: External ear normal.  Mouth/Throat: Oropharynx is clear and moist.  Eyes: Pupils are equal, round, and reactive to light. Conjunctivae and EOM are normal. Lids are everted and swept, no foreign bodies found. Left eye exhibits no hordeolum. No foreign body present in the left eye. Right conjunctiva is not injected. Left conjunctiva is not injected. No scleral icterus.  Neck: Normal range of motion. Neck supple. No JVD present. No tracheal deviation present. No thyromegaly present.  Cardiovascular: Normal rate, regular rhythm, normal heart sounds and intact distal pulses. Exam reveals no gallop and no friction rub.  No murmur heard. Pulmonary/Chest: Effort normal and breath sounds normal. No respiratory distress. She has no wheezes. She has no rales.  Abdominal: Soft. Bowel sounds are normal. She exhibits no mass. There is no hepatosplenomegaly. There is no tenderness. There is no rebound and no guarding.  Musculoskeletal: Normal range  of motion. She exhibits no edema or tenderness.  Lymphadenopathy:    She has no cervical adenopathy.  Neurological: She is alert and oriented to person, place, and time. She has normal strength. She displays normal reflexes. No cranial nerve deficit.  Skin: Skin is warm. No rash noted.  Psychiatric: She has a normal mood and affect. Her mood appears not anxious. She does not exhibit a depressed mood.  Nursing note and vitals reviewed.   BP 110/72   Pulse 72   Ht 5\' 6"  (1.676 m)   Wt 219 lb (99.3 kg)   BMI 35.35 kg/m   Assessment and Plan:  1. Acute maxillary sinusitis, recurrence not specified Prescribed ZPack use as directed - azithromycin (ZITHROMAX) 250 MG tablet; 2 today then 1 a day for 4 days.  Dispense: 6 tablet; Refill: 0  2. Bronchitis Prescribed cough syrup as needed for cough - guaiFENesin-codeine (ROBITUSSIN AC) 100-10 MG/5ML syrup; Take 5 mLs by mouth 3 (three) times daily as needed for cough.  Dispense: 150 mL; Refill: 0  3. Essential (primary) hypertension Stable on meds- refill lisinopril/ HCTZ - lisinopril-hydrochlorothiazide (PRINZIDE,ZESTORETIC) 10-12.5 MG tablet; Take 1 tablet by mouth daily.  Dispense: 90 tablet; Refill: 1  4. Labyrinthitis, unspecified laterality Stable on med- refill meclizine as needed for dizziness - meclizine (ANTIVERT) 25 MG tablet; Take 1 tablet (25 mg total) by mouth 3 (three) times daily as needed for dizziness.  Dispense: 30 tablet; Refill: 0  5. Flu vaccine need administered - Flu Vaccine QUAD 6+ mos PF IM (Fluarix Quad PF)   Dr. Otilio Miu [facility]  04/17/2018

## 2018-06-22 ENCOUNTER — Encounter: Payer: Self-pay | Admitting: Family Medicine

## 2018-06-22 ENCOUNTER — Ambulatory Visit: Payer: BLUE CROSS/BLUE SHIELD | Admitting: Family Medicine

## 2018-06-22 VITALS — BP 110/70 | HR 68 | Ht 66.0 in | Wt 217.0 lb

## 2018-06-22 DIAGNOSIS — J0100 Acute maxillary sinusitis, unspecified: Secondary | ICD-10-CM

## 2018-06-22 MED ORDER — GUAIFENESIN-CODEINE 100-10 MG/5ML PO SYRP: 10.0000 mL | ORAL_SOLUTION | Freq: Four times a day (QID) | ORAL | 0 refills | Status: DC | PRN

## 2018-06-22 MED ORDER — AZITHROMYCIN 250 MG PO TABS: ORAL_TABLET | ORAL | 0 refills | Status: DC

## 2018-06-22 NOTE — Progress Notes (Signed)
Date:  06/22/2018   Name:  Theresa Gross   DOB:  July 17, 1955   MRN:  [id number]   Chief Complaint: Sinusitis (cough and sinus drainage- yellow/ green production. Had Zpack in Oct)  Sinusitis  This is a new problem. The current episode started in the past 7 days (3 days). The problem has been gradually worsening since onset. There has been no fever. The pain is mild. Associated symptoms include congestion, coughing, headaches, sinus pressure, sneezing and a sore throat. Pertinent negatives include no chills, diaphoresis, ear pain, hoarse voice, neck pain, shortness of breath or swollen glands. Past treatments include acetaminophen and oral decongestants. The treatment provided moderate relief.    Review of Systems  Constitutional: Negative.  Negative for chills, diaphoresis, fatigue, fever and unexpected weight change.  HENT: Positive for congestion, sinus pressure, sneezing and sore throat. Negative for ear discharge, ear pain, hoarse voice and rhinorrhea.   Eyes: Negative for photophobia, pain, discharge, redness and itching.  Respiratory: Positive for cough. Negative for chest tightness, shortness of breath and stridor.   Cardiovascular: Negative for chest pain.  Gastrointestinal: Negative for abdominal pain, blood in stool, constipation, diarrhea, nausea and vomiting.  Endocrine: Negative for cold intolerance, heat intolerance, polydipsia, polyphagia and polyuria.  Genitourinary: Negative for dysuria, flank pain, frequency, hematuria, menstrual problem, pelvic pain, urgency, vaginal bleeding and vaginal discharge.  Musculoskeletal: Negative for arthralgias, back pain, myalgias and neck pain.  Skin: Negative for rash.  Allergic/Immunologic: Negative for environmental allergies and food allergies.  Neurological: Positive for headaches. Negative for dizziness, weakness, light-headedness and numbness.  Hematological: Negative for adenopathy. Does not bruise/bleed easily.    Psychiatric/Behavioral: Negative for dysphoric mood. The patient is not nervous/anxious.     Patient Active Problem List   Diagnosis Date Noted  . Class 2 obesity due to excess calories without serious comorbidity with body mass index (BMI) of 37.0 to 37.9 in adult 02/16/2017  . Pure hypercholesterolemia 02/16/2017  . Aortic atherosclerosis ([place]) 05/25/2016  . Steatosis of liver 05/25/2016  . Mixed hyperlipidemia 05/25/2016  . Essential (primary) hypertension 10/28/2014  . Gastro-esophageal reflux disease without esophagitis 10/28/2014  . H/O psoriasis 10/28/2014  . Decreased potassium in the blood 10/28/2014  . Familial multiple lipoprotein-type hyperlipidemia 10/28/2014    Allergies  Allergen Reactions  . Lipitor  [Atorvastatin]   . Metronidazole   . Nsaids Other (See Comments)    Have to be careful due to liver    Past Surgical History:  Procedure Laterality Date  . ABDOMINAL HYSTERECTOMY    . BREAST BIOPSY Right 10 plus yrs ago   stereo bx./clip. Benign  . CHOLECYSTECTOMY    . COLONOSCOPY WITH PROPOFOL N/A 06/16/2016   Procedure: COLONOSCOPY WITH PROPOFOL;  Surgeon: Manya Silvas, MD;  Location: [facility] ENDOSCOPY;  Service: Endoscopy;  Laterality: N/A;  . DIAGNOSTIC LAPAROSCOPY    . EYE SURGERY    . GALLBLADDER SURGERY  06/04/2014    Social History   Tobacco Use  . Smoking status: Former Research scientist (life sciences)  . Smokeless tobacco: Never Used  Substance Use Topics  . Alcohol use: Yes    Alcohol/week: 1.0 standard drinks    Types: 1 Cans of beer per week  . Drug use: No     Medication list has been reviewed and updated.  Current Meds  Medication Sig  . Apremilast 30 MG TABS Take 1 tablet by mouth 2 (two) times daily. Derm  . aspirin 81 MG tablet Take 1 tablet by mouth daily.  Marland Kitchen  Cholecalciferol (VITAMIN D) 2000 units tablet Take 2,000 Units by mouth daily.  . DELZICOL 400 MG CPDR DR capsule Take 2 tablets by mouth 2 (two) times daily. GI Dr  . lansoprazole (PREVACID) 30  MG capsule Take 1 capsule by mouth daily. GI Doc  . lisinopril-hydrochlorothiazide (PRINZIDE,ZESTORETIC) 10-12.5 MG tablet Take 1 tablet by mouth daily.  . meclizine (ANTIVERT) 25 MG tablet Take 1 tablet (25 mg total) by mouth 3 (three) times daily as needed for dizziness.  . nystatin-triamcinolone (MYCOLOG II) cream Apply 1 application topically 2 (two) times daily. PRN/ Derm  . rosuvastatin (CRESTOR) 10 MG tablet Take by mouth.   Current Facility-Administered Medications for the 06/22/18 encounter (Office Visit) with Juline Patch, MD  Medication  . ipratropium-albuterol (DUONEB) 0.5-2.5 (3) MG/3ML nebulizer solution 3 mL    PHQ 2/9 Scores 04/17/2018 02/16/2017 11/19/2015 10/22/2015  PHQ - 2 Score 0 0 0 0  PHQ- 9 Score 0 0 - -    Physical Exam Constitutional:      General: She is not in acute distress.    Appearance: She is not diaphoretic.  HENT:     Head: Normocephalic and atraumatic.     Right Ear: Hearing, tympanic membrane, ear canal and external ear normal.     Left Ear: Hearing, tympanic membrane, ear canal and external ear normal.     Nose:     Right Sinus: Maxillary sinus tenderness present. No frontal sinus tenderness.     Left Sinus: Maxillary sinus tenderness present. No frontal sinus tenderness.     Mouth/Throat:     Palate: No mass.     Pharynx: No pharyngeal swelling, oropharyngeal exudate or posterior oropharyngeal erythema.     Tonsils: No tonsillar exudate.  Eyes:     General:        Right eye: No discharge.        Left eye: No discharge.     Conjunctiva/sclera: Conjunctivae normal.     Pupils: Pupils are equal, round, and reactive to light.  Neck:     Musculoskeletal: Normal range of motion and neck supple.     Thyroid: No thyromegaly.     Vascular: No carotid bruit or JVD.  Cardiovascular:     Rate and Rhythm: Normal rate and regular rhythm.     Heart sounds: Normal heart sounds. No murmur. No friction rub. No gallop.   Pulmonary:     Effort:  Pulmonary effort is normal.     Breath sounds: Normal breath sounds.  Abdominal:     General: Bowel sounds are normal.     Palpations: Abdomen is soft. There is no mass.     Tenderness: There is no abdominal tenderness. There is no guarding.  Musculoskeletal: Normal range of motion.  Lymphadenopathy:     Cervical: No cervical adenopathy.  Skin:    General: Skin is warm and dry.  Neurological:     Mental Status: She is alert.     Deep Tendon Reflexes: Reflexes are normal and symmetric.     BP 110/70   Pulse 68   Ht 5\' 6"  (1.676 m)   Wt 217 lb (98.4 kg)   BMI 35.02 kg/m   Assessment and Plan: 1. Acute maxillary sinusitis, recurrence not specified Acute.  Persistent initiate azithromycin 250 mg 2 today and 1 a day for 4 days thereafter.  Patient was given Robitussin for frequent cough. - azithromycin (ZITHROMAX) 250 MG tablet; 2 today then 1 a day for 4 days  Dispense:  6 tablet; Refill: 0 - guaiFENesin-codeine (ROBITUSSIN AC) 100-10 MG/5ML syrup; Take 10 mLs by mouth 4 (four) times daily as needed for cough.  Dispense: 150 mL; Refill: 0

## 2018-07-19 ENCOUNTER — Other Ambulatory Visit: Payer: Self-pay | Admitting: Obstetrics and Gynecology

## 2018-07-19 DIAGNOSIS — Z1231 Encounter for screening mammogram for malignant neoplasm of breast: Secondary | ICD-10-CM

## 2018-10-23 ENCOUNTER — Other Ambulatory Visit: Payer: Self-pay

## 2018-10-23 ENCOUNTER — Encounter: Payer: Self-pay | Admitting: Family Medicine

## 2018-10-23 ENCOUNTER — Ambulatory Visit (INDEPENDENT_AMBULATORY_CARE_PROVIDER_SITE_OTHER): Payer: PRIVATE HEALTH INSURANCE | Admitting: Family Medicine

## 2018-10-23 VITALS — BP 120/70 | HR 64 | Ht 66.0 in | Wt 222.0 lb

## 2018-10-23 DIAGNOSIS — R69 Illness, unspecified: Secondary | ICD-10-CM | POA: Diagnosis not present

## 2018-10-23 DIAGNOSIS — E782 Mixed hyperlipidemia: Secondary | ICD-10-CM

## 2018-10-23 DIAGNOSIS — I10 Essential (primary) hypertension: Secondary | ICD-10-CM | POA: Diagnosis not present

## 2018-10-23 MED ORDER — LISINOPRIL-HYDROCHLOROTHIAZIDE 10-12.5 MG PO TABS: 1.0000 | ORAL_TABLET | Freq: Every day | ORAL | 1 refills | Status: DC

## 2018-10-23 NOTE — Progress Notes (Signed)
Date:  10/23/2018   Name:  Theresa Gross   DOB:  1956/01/08   MRN:  [id number]   Chief Complaint: Hypertension  Hypertension  This is a new problem. The current episode started more than 1 year ago. The problem has been waxing and waning since onset. The problem is controlled. Pertinent negatives include no anxiety, blurred vision, chest pain, headaches, malaise/fatigue, neck pain, orthopnea, palpitations, peripheral edema, PND, shortness of breath or sweats. There are no associated agents to hypertension. There are no known risk factors for coronary artery disease. Past treatments include ACE inhibitors and diuretics. The current treatment provides moderate improvement. There are no compliance problems.  There is no history of angina, kidney disease, CAD/MI, CVA, heart failure, left ventricular hypertrophy, PVD or retinopathy. There is no history of chronic renal disease, a hypertension causing med or renovascular disease.    Review of Systems  Constitutional: Negative.  Negative for chills, fatigue, fever, malaise/fatigue and unexpected weight change.  HENT: Negative for congestion, ear discharge, ear pain, rhinorrhea, sinus pressure, sneezing and sore throat.   Eyes: Negative for blurred vision, photophobia, pain, discharge, redness and itching.  Respiratory: Negative for cough, shortness of breath, wheezing and stridor.   Cardiovascular: Negative for chest pain, palpitations, orthopnea and PND.  Gastrointestinal: Negative for abdominal pain, blood in stool, constipation, diarrhea, nausea and vomiting.  Endocrine: Negative for cold intolerance, heat intolerance, polydipsia, polyphagia and polyuria.  Genitourinary: Negative for dysuria, flank pain, frequency, hematuria, menstrual problem, pelvic pain, urgency, vaginal bleeding and vaginal discharge.  Musculoskeletal: Negative for arthralgias, back pain, myalgias and neck pain.  Skin: Negative for rash.  Allergic/Immunologic: Negative for  environmental allergies and food allergies.  Neurological: Negative for dizziness, weakness, light-headedness, numbness and headaches.  Hematological: Negative for adenopathy. Does not bruise/bleed easily.  Psychiatric/Behavioral: Negative for dysphoric mood. The patient is not nervous/anxious.     Patient Active Problem List   Diagnosis Date Noted  . Class 2 obesity due to excess calories without serious comorbidity with body mass index (BMI) of 37.0 to 37.9 in adult 02/16/2017  . Pure hypercholesterolemia 02/16/2017  . Aortic atherosclerosis ([place]) 05/25/2016  . Steatosis of liver 05/25/2016  . Mixed hyperlipidemia 05/25/2016  . Essential (primary) hypertension 10/28/2014  . Gastro-esophageal reflux disease without esophagitis 10/28/2014  . H/O psoriasis 10/28/2014  . Decreased potassium in the blood 10/28/2014  . Familial multiple lipoprotein-type hyperlipidemia 10/28/2014    Allergies  Allergen Reactions  . Lipitor  [Atorvastatin]   . Metronidazole   . Nsaids Other (See Comments)    Have to be careful due to liver    Past Surgical History:  Procedure Laterality Date  . ABDOMINAL HYSTERECTOMY    . BREAST BIOPSY Right 10 plus yrs ago   stereo bx./clip. Benign  . CHOLECYSTECTOMY    . COLONOSCOPY WITH PROPOFOL N/A 06/16/2016   Procedure: COLONOSCOPY WITH PROPOFOL;  Surgeon: Manya Silvas, MD;  Location: [facility] ENDOSCOPY;  Service: Endoscopy;  Laterality: N/A;  . DIAGNOSTIC LAPAROSCOPY    . EYE SURGERY    . GALLBLADDER SURGERY  06/04/2014    Social History   Tobacco Use  . Smoking status: Former Research scientist (life sciences)  . Smokeless tobacco: Never Used  Substance Use Topics  . Alcohol use: Yes    Alcohol/week: 1.0 standard drinks    Types: 1 Cans of beer per week  . Drug use: No     Medication list has been reviewed and updated.  Current Meds  Medication Sig  .  Apremilast 30 MG TABS Take 1 tablet by mouth 2 (two) times daily. Derm  . aspirin 81 MG tablet Take 1 tablet by mouth  daily.  . carbamide peroxide (DEBROX) 6.5 % OTIC solution Place 5 drops into the right ear 2 (two) times daily.  . Cholecalciferol (VITAMIN D) 2000 units tablet Take 2,000 Units by mouth daily.  . DELZICOL 400 MG CPDR DR capsule Take 2 tablets by mouth 2 (two) times daily. GI Dr  . lansoprazole (PREVACID) 30 MG capsule Take 1 capsule by mouth daily. GI Doc  . lisinopril-hydrochlorothiazide (PRINZIDE,ZESTORETIC) 10-12.5 MG tablet Take 1 tablet by mouth daily.  . meclizine (ANTIVERT) 25 MG tablet Take 1 tablet (25 mg total) by mouth 3 (three) times daily as needed for dizziness.  . nystatin-triamcinolone (MYCOLOG II) cream Apply 1 application topically 2 (two) times daily. PRN/ Derm  . rosuvastatin (CRESTOR) 10 MG tablet Take by mouth. cardio   Current Facility-Administered Medications for the 10/23/18 encounter (Office Visit) with Juline Patch, MD  Medication  . ipratropium-albuterol (DUONEB) 0.5-2.5 (3) MG/3ML nebulizer solution 3 mL    PHQ 2/9 Scores 10/23/2018 04/17/2018 02/16/2017 11/19/2015  PHQ - 2 Score 0 0 0 0  PHQ- 9 Score 0 0 0 -    BP Readings from Last 3 Encounters:  10/23/18 120/70  06/22/18 110/70  04/17/18 110/72    Physical Exam Vitals signs and nursing note reviewed.  Constitutional:      General: She is not in acute distress.    Appearance: She is not diaphoretic.  HENT:     Head: Normocephalic and atraumatic.     Right Ear: External ear normal.     Left Ear: External ear normal.     Nose: Nose normal.  Eyes:     General:        Right eye: No discharge.        Left eye: No discharge.     Conjunctiva/sclera: Conjunctivae normal.     Pupils: Pupils are equal, round, and reactive to light.  Neck:     Musculoskeletal: Normal range of motion and neck supple.     Thyroid: No thyromegaly.     Vascular: No JVD.  Cardiovascular:     Rate and Rhythm: Normal rate and regular rhythm.     Chest Wall: PMI is not displaced. No thrill.     Pulses: Normal pulses. No  decreased pulses.          Carotid pulses are 2+ on the right side and 2+ on the left side.      Radial pulses are 2+ on the right side and 2+ on the left side.       Femoral pulses are 2+ on the right side and 2+ on the left side.      Popliteal pulses are 2+ on the right side and 2+ on the left side.       Dorsalis pedis pulses are 2+ on the right side and 2+ on the left side.       Posterior tibial pulses are 2+ on the right side and 2+ on the left side.     Heart sounds: S1 normal and S2 normal. Heart sounds not distant. No murmur. No systolic murmur. No diastolic murmur. No friction rub. No gallop. No S3 or S4 sounds.   Pulmonary:     Effort: Pulmonary effort is normal.     Breath sounds: Normal breath sounds.  Abdominal:     General: Bowel sounds  are normal.     Palpations: Abdomen is soft. There is no mass.     Tenderness: There is no abdominal tenderness. There is no guarding.  Musculoskeletal: Normal range of motion.     Right lower leg: No edema.     Left lower leg: No edema.  Lymphadenopathy:     Cervical: No cervical adenopathy.  Skin:    General: Skin is warm and dry.  Neurological:     Mental Status: She is alert.     Deep Tendon Reflexes: Reflexes are normal and symmetric.     Wt Readings from Last 3 Encounters:  10/23/18 222 lb (100.7 kg)  06/22/18 217 lb (98.4 kg)  04/17/18 219 lb (99.3 kg)    BP 120/70   Pulse 64   Ht 5\' 6"  (1.676 m)   Wt 222 lb (100.7 kg)   BMI 35.83 kg/m   Assessment and Plan: 1. Essential (primary) hypertension Chronic.  Controlled.  Continue lisinopril hydrochlorothiazide 10-12 0.5.  Will check renal function panel. - lisinopril-hydrochlorothiazide (ZESTORETIC) 10-12.5 MG tablet; Take 1 tablet by mouth daily.  Dispense: 90 tablet; Refill: 1 - Renal function panel  2. Mixed hyperlipidemia Chronic.  Controlled.  Patient is able to control this with dietary discretion.  Continue with check of lipid panel.  Patient will continue  rosuvastatin 10 mg once a day. - Lipid Panel With LDL/HDL Ratio  3. Taking medication for chronic disease Patient currently on a statin and we will check hepatic function panel to rule out emergence of hepatotoxicity side effect. - Hepatic Function Panel (6)

## 2018-10-24 LAB — LIPID PANEL WITH LDL/HDL RATIO
Cholesterol, Total: 215 mg/dL — ABNORMAL HIGH (ref 100–199)
HDL: 59 mg/dL (ref >39)
LDL Calculated: 122 mg/dL — ABNORMAL HIGH (ref 0–99)
LDl/HDL Ratio: 2.1 ratio (ref 0.0–3.2)
Triglycerides: 171 mg/dL — ABNORMAL HIGH (ref 0–149)
VLDL Cholesterol Cal: 34 mg/dL (ref 5–40)

## 2018-10-24 LAB — RENAL FUNCTION PANEL
Albumin: 4.6 g/dL (ref 3.8–4.8)
BUN/Creatinine Ratio: 17 (ref 12–28)
BUN: 13 mg/dL (ref 8–27)
CO2: 24 mmol/L (ref 20–29)
Calcium: 9.6 mg/dL (ref 8.7–10.3)
Chloride: 100 mmol/L (ref 96–106)
Creatinine, Ser: 0.77 mg/dL (ref 0.57–1.00)
GFR calc Af Amer: 94 mL/min/{1.73_m2} (ref >59)
GFR calc non Af Amer: 82 mL/min/{1.73_m2} (ref >59)
Glucose: 92 mg/dL (ref 65–99)
Phosphorus: 3.8 mg/dL (ref 3.0–4.3)
Potassium: 4.6 mmol/L (ref 3.5–5.2)
Sodium: 140 mmol/L (ref 134–144)

## 2018-10-24 LAB — HEPATIC FUNCTION PANEL (6)
ALT: 23 IU/L (ref 0–32)
AST: 19 IU/L (ref 0–40)
Alkaline Phosphatase: 69 IU/L (ref 39–117)
Bilirubin Total: 0.4 mg/dL (ref 0.0–1.2)
Bilirubin, Direct: 0.13 mg/dL (ref 0.00–0.40)

## 2018-11-14 ENCOUNTER — Ambulatory Visit: Payer: BLUE CROSS/BLUE SHIELD

## 2019-01-16 ENCOUNTER — Other Ambulatory Visit: Payer: Self-pay

## 2019-01-16 ENCOUNTER — Encounter (INDEPENDENT_AMBULATORY_CARE_PROVIDER_SITE_OTHER): Payer: Self-pay

## 2019-01-16 ENCOUNTER — Ambulatory Visit
Admission: RE | Admit: 2019-01-16 | Discharge: 2019-01-16 | Disposition: A | Discharge status: Home / Self Care | Payer: PRIVATE HEALTH INSURANCE | Source: Ambulatory Visit | Attending: Obstetrics and Gynecology | Admitting: Obstetrics and Gynecology

## 2019-01-16 DIAGNOSIS — Z1231 Encounter for screening mammogram for malignant neoplasm of breast: Secondary | ICD-10-CM | POA: Diagnosis not present

## 2019-02-26 ENCOUNTER — Other Ambulatory Visit: Payer: Self-pay

## 2019-02-26 DIAGNOSIS — I10 Essential (primary) hypertension: Secondary | ICD-10-CM

## 2019-03-16 ENCOUNTER — Other Ambulatory Visit: Payer: Self-pay

## 2019-03-16 DIAGNOSIS — I10 Essential (primary) hypertension: Secondary | ICD-10-CM

## 2019-03-16 DIAGNOSIS — E782 Mixed hyperlipidemia: Secondary | ICD-10-CM

## 2019-03-16 MED ORDER — ROSUVASTATIN CALCIUM 10 MG PO TABS: 10.0000 mg | ORAL_TABLET | Freq: Every day | ORAL | 0 refills | Status: DC

## 2019-03-16 MED ORDER — LISINOPRIL-HYDROCHLOROTHIAZIDE 10-12.5 MG PO TABS: 1.0000 | ORAL_TABLET | Freq: Every day | ORAL | 0 refills | Status: DC

## 2019-04-02 IMAGING — MG MM DIGITAL SCREENING BILAT W/ TOMO W/ CAD
8 of 12 series · 8 of 28 positions shown · non-contrast
Comparison: Previous exam(s).

CLINICAL DATA: Screening.

EXAM:
2D DIGITAL SCREENING BILATERAL MAMMOGRAM WITH CAD AND ADJUNCT TOMO

[L CC]
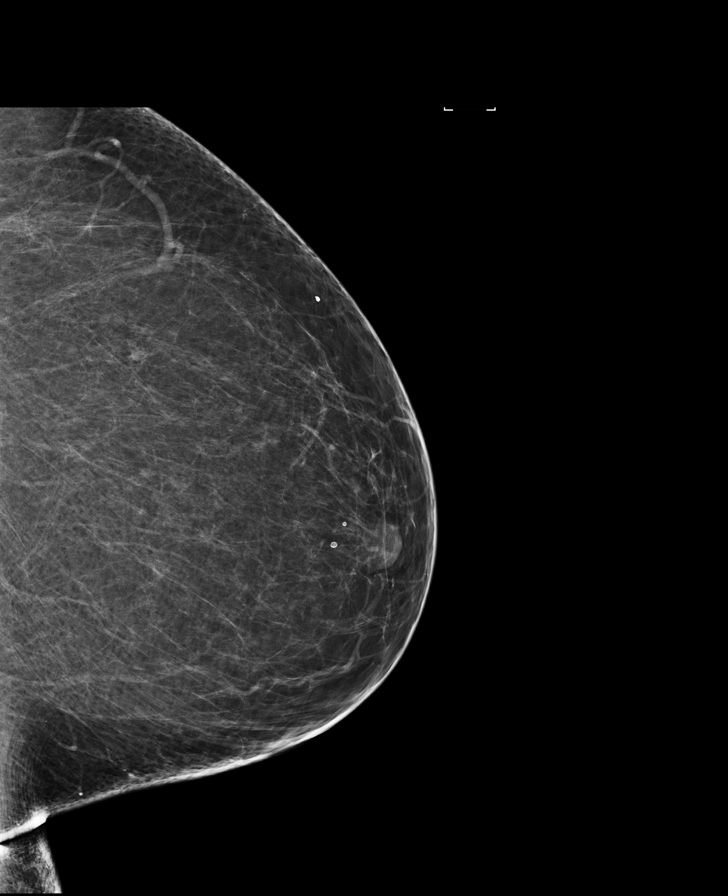

[R MLO]
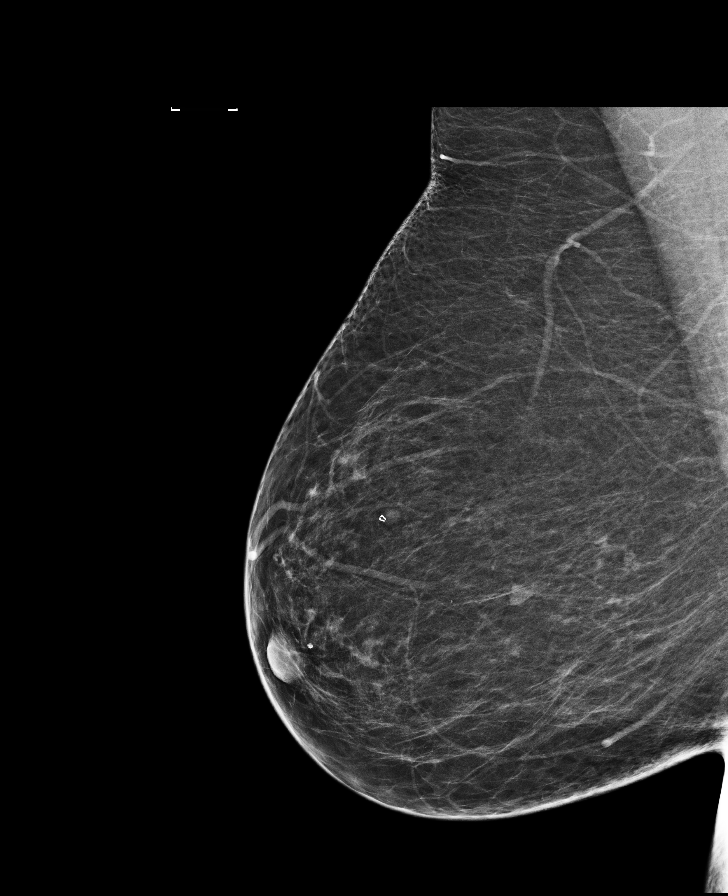

[L MLO synth-2D]
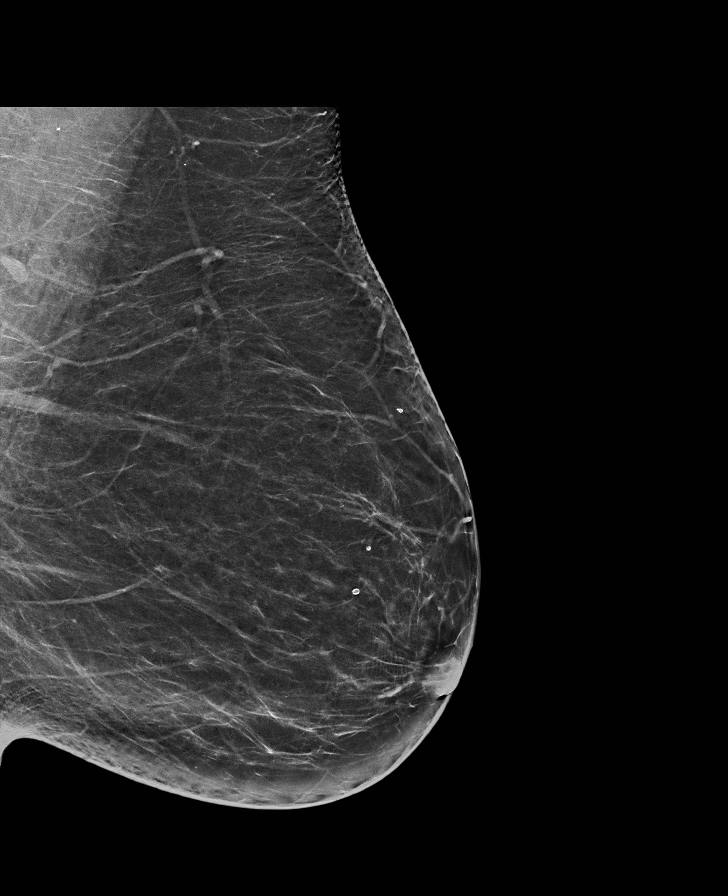

[L MLO]
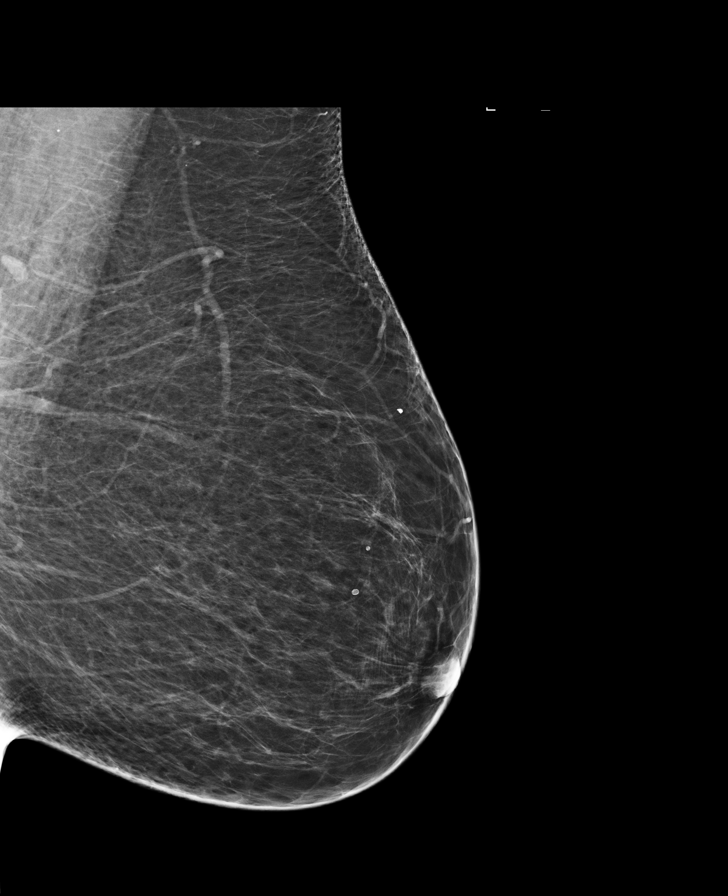

[R MLO synth-2D]
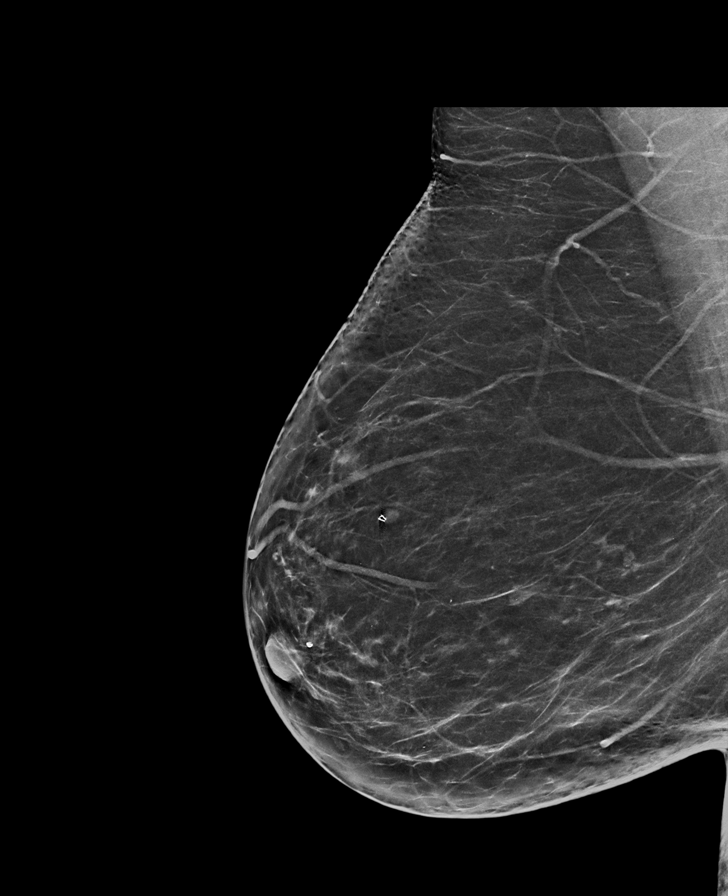

[R CC]
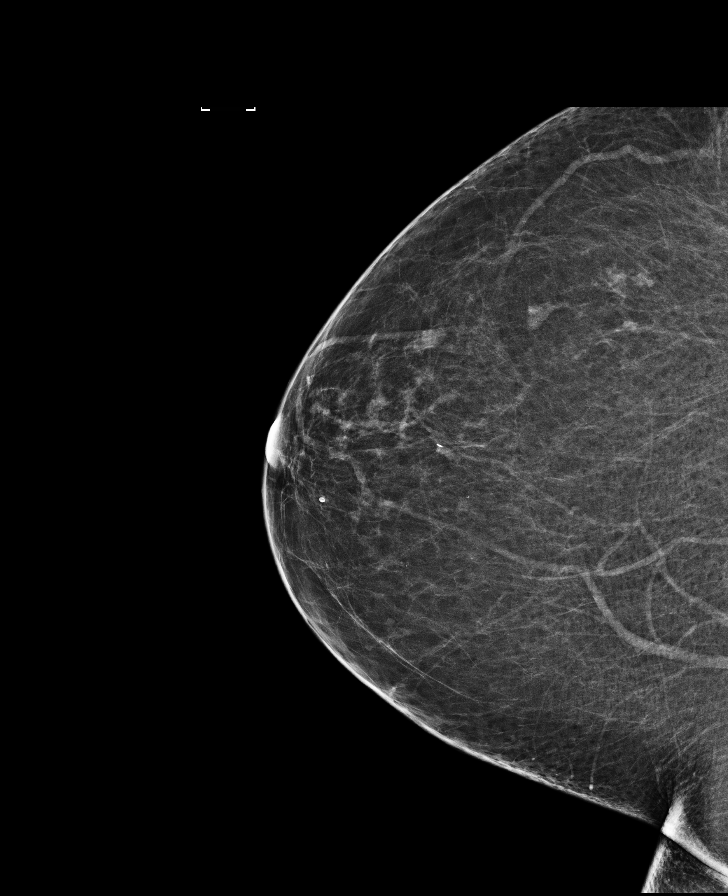

[R CC synth-2D]
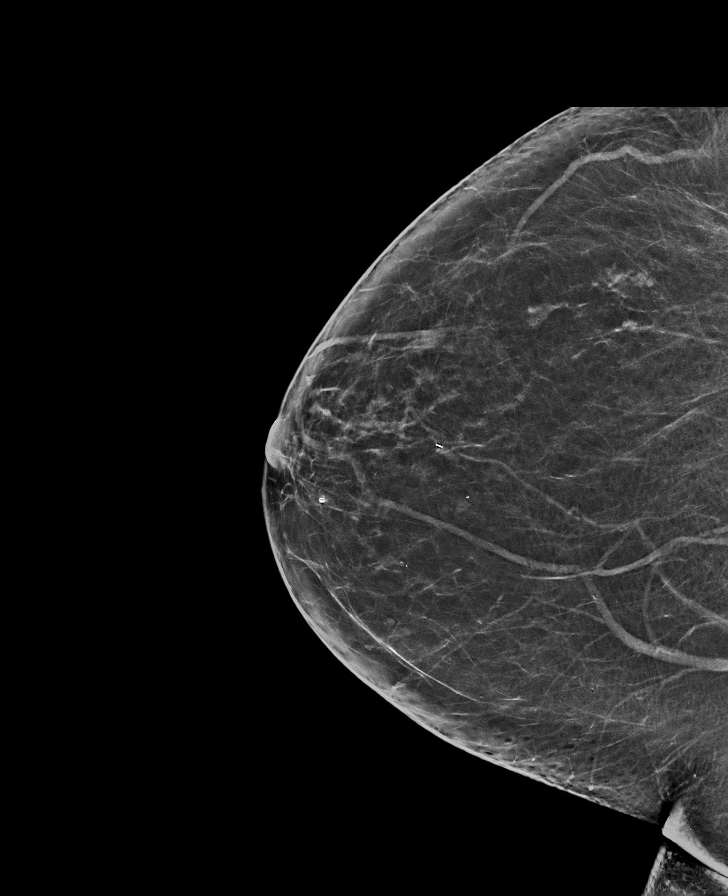

[L CC synth-2D]
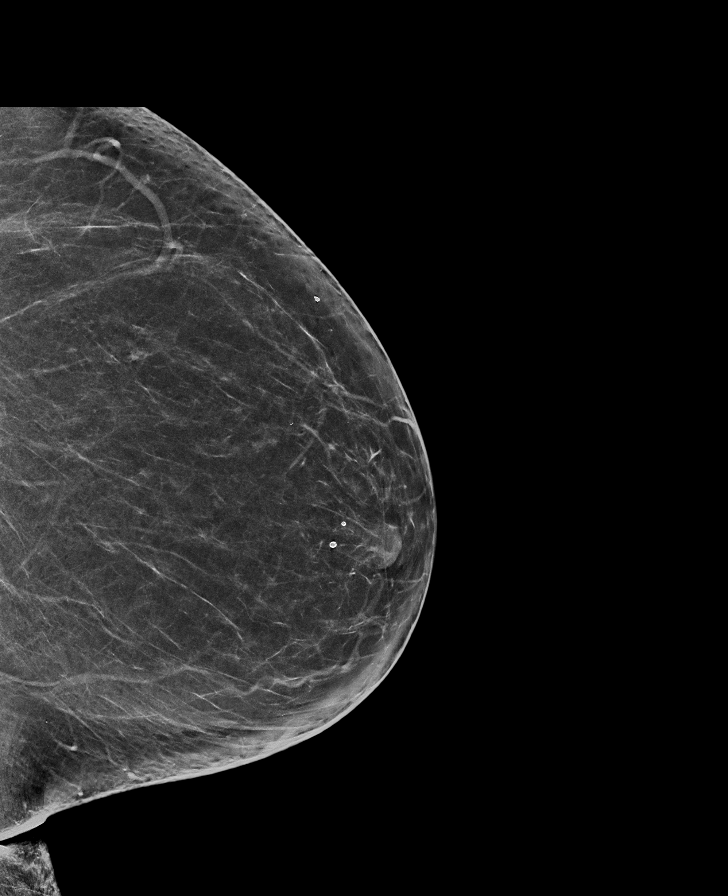

[8 of 28 positions shown; findings below may reference images not displayed]

ACR Breast Density Category b: There are scattered areas of
fibroglandular density.
FINDINGS: There are no findings suspicious for malignancy. Images were
processed with CAD.
IMPRESSION: No mammographic evidence of malignancy. A result letter of this
screening mammogram will be mailed directly to the patient.

RECOMMENDATION:
Screening mammogram in one year. (Code:97-6-RS4)

BI-RADS CATEGORY  1: Negative.

## 2019-04-25 ENCOUNTER — Encounter: Payer: Self-pay | Admitting: Family Medicine

## 2019-04-25 ENCOUNTER — Other Ambulatory Visit: Payer: Self-pay

## 2019-04-25 ENCOUNTER — Ambulatory Visit (INDEPENDENT_AMBULATORY_CARE_PROVIDER_SITE_OTHER): Admitting: Family Medicine

## 2019-04-25 VITALS — BP 122/76 | HR 78 | Resp 16 | Ht 66.0 in | Wt 225.0 lb

## 2019-04-25 DIAGNOSIS — Z23 Encounter for immunization: Secondary | ICD-10-CM

## 2019-04-25 DIAGNOSIS — I700 Atherosclerosis of aorta: Secondary | ICD-10-CM

## 2019-04-25 DIAGNOSIS — E7800 Pure hypercholesterolemia, unspecified: Secondary | ICD-10-CM | POA: Diagnosis not present

## 2019-04-25 DIAGNOSIS — I10 Essential (primary) hypertension: Secondary | ICD-10-CM

## 2019-04-25 MED ORDER — ROSUVASTATIN CALCIUM 20 MG PO TABS: 20.0000 mg | ORAL_TABLET | Freq: Every day | ORAL | 1 refills | Status: DC

## 2019-04-25 MED ORDER — LISINOPRIL-HYDROCHLOROTHIAZIDE 10-12.5 MG PO TABS: 1.0000 | ORAL_TABLET | Freq: Every day | ORAL | 1 refills | Status: DC

## 2019-04-25 NOTE — Patient Instructions (Signed)

## 2019-04-25 NOTE — Progress Notes (Signed)
Date:  04/25/2019   Name:  Theresa Gross   DOB:  Mar 10, 1956   MRN:  [id number]   Chief Complaint: Hypertension  Hypertension This is a chronic problem. The current episode started more than 1 year ago. The problem has been gradually improving since onset. The problem is controlled. Pertinent negatives include no anxiety, blurred vision, chest pain, headaches, malaise/fatigue, neck pain, orthopnea, palpitations, peripheral edema, PND, shortness of breath or sweats. There are no associated agents to hypertension. Risk factors for coronary artery disease include obesity and dyslipidemia. Past treatments include ACE inhibitors and diuretics. The current treatment provides moderate improvement. There are no compliance problems.  There is no history of angina, kidney disease, CAD/MI, CVA, heart failure, left ventricular hypertrophy, PVD or retinopathy. There is no history of chronic renal disease, a hypertension causing med or renovascular disease.  Hyperlipidemia This is a chronic problem. The current episode started more than 1 year ago. Recent lipid tests were reviewed and are normal. She has no history of chronic renal disease, diabetes, hypothyroidism, liver disease, obesity or nephrotic syndrome. Pertinent negatives include no chest pain, focal sensory loss, focal weakness, leg pain, myalgias or shortness of breath. Current antihyperlipidemic treatment includes statins (crestor). The current treatment provides moderate improvement of lipids. There are no compliance problems.  Risk factors for coronary artery disease include dyslipidemia and hypertension.    Review of Systems  Constitutional: Negative.  Negative for chills, fatigue, fever, malaise/fatigue and unexpected weight change.  HENT: Negative for congestion, ear discharge, ear pain, rhinorrhea, sinus pressure, sneezing and sore throat.   Eyes: Negative for blurred vision, photophobia, pain, discharge, redness and itching.  Respiratory:  Negative for cough, shortness of breath, wheezing and stridor.   Cardiovascular: Negative for chest pain, palpitations, orthopnea and PND.  Gastrointestinal: Negative for abdominal pain, blood in stool, constipation, diarrhea, nausea and vomiting.  Endocrine: Negative for cold intolerance, heat intolerance, polydipsia, polyphagia and polyuria.  Genitourinary: Negative for dysuria, flank pain, frequency, hematuria, menstrual problem, pelvic pain, urgency, vaginal bleeding and vaginal discharge.  Musculoskeletal: Negative for arthralgias, back pain, myalgias and neck pain.  Skin: Negative for rash.  Allergic/Immunologic: Negative for environmental allergies and food allergies.  Neurological: Negative for dizziness, focal weakness, weakness, light-headedness, numbness and headaches.  Hematological: Negative for adenopathy. Does not bruise/bleed easily.  Psychiatric/Behavioral: Negative for dysphoric mood. The patient is not nervous/anxious.     Patient Active Problem List   Diagnosis Date Noted  . Class 2 obesity due to excess calories without serious comorbidity with body mass index (BMI) of 37.0 to 37.9 in adult 02/16/2017  . Pure hypercholesterolemia 02/16/2017  . Aortic atherosclerosis ([place]) 05/25/2016  . Steatosis of liver 05/25/2016  . Mixed hyperlipidemia 05/25/2016  . Essential (primary) hypertension 10/28/2014  . Gastro-esophageal reflux disease without esophagitis 10/28/2014  . H/O psoriasis 10/28/2014  . Decreased potassium in the blood 10/28/2014  . Familial multiple lipoprotein-type hyperlipidemia 10/28/2014    Allergies  Allergen Reactions  . Lipitor  [Atorvastatin]   . Metronidazole   . Nsaids Other (See Comments)    Have to be careful due to liver    Past Surgical History:  Procedure Laterality Date  . ABDOMINAL HYSTERECTOMY    . BREAST BIOPSY Right 10 plus yrs ago   stereo bx./clip. Benign  . CHOLECYSTECTOMY    . COLONOSCOPY WITH PROPOFOL N/A 06/16/2016    Procedure: COLONOSCOPY WITH PROPOFOL;  Surgeon: Manya Silvas, MD;  Location: [facility] ENDOSCOPY;  Service: Endoscopy;  Laterality: N/A;  .  DIAGNOSTIC LAPAROSCOPY    . EYE SURGERY    . GALLBLADDER SURGERY  06/04/2014    Social History   Tobacco Use  . Smoking status: Former Research scientist (life sciences)  . Smokeless tobacco: Never Used  Substance Use Topics  . Alcohol use: Yes    Alcohol/week: 1.0 standard drinks    Types: 1 Cans of beer per week  . Drug use: No     Medication list has been reviewed and updated.  Current Meds  Medication Sig  . Apremilast 30 MG TABS Take 1 tablet by mouth 2 (two) times daily. Derm  . aspirin 81 MG tablet Take 1 tablet by mouth daily.  . carbamide peroxide (DEBROX) 6.5 % OTIC solution Place 5 drops into the right ear 2 (two) times daily.  . Cholecalciferol (VITAMIN D) 2000 units tablet Take 2,000 Units by mouth daily.  . DELZICOL 400 MG CPDR DR capsule Take 2 tablets by mouth 2 (two) times daily. GI Dr  . lansoprazole (PREVACID) 30 MG capsule Take 1 capsule by mouth daily. GI Doc  . lisinopril-hydrochlorothiazide (ZESTORETIC) 10-12.5 MG tablet Take 1 tablet by mouth daily.  . meclizine (ANTIVERT) 25 MG tablet Take 1 tablet (25 mg total) by mouth 3 (three) times daily as needed for dizziness.  . nystatin-triamcinolone (MYCOLOG II) cream Apply 1 application topically 2 (two) times daily. PRN/ Derm  . rosuvastatin (CRESTOR) 10 MG tablet Take 1 tablet (10 mg total) by mouth daily. cardio   Current Facility-Administered Medications for the 04/25/19 encounter (Office Visit) with Juline Patch, MD  Medication  . ipratropium-albuterol (DUONEB) 0.5-2.5 (3) MG/3ML nebulizer solution 3 mL    PHQ 2/9 Scores 10/23/2018 04/17/2018 02/16/2017 11/19/2015  PHQ - 2 Score 0 0 0 0  PHQ- 9 Score 0 0 0 -    BP Readings from Last 3 Encounters:  04/25/19 122/76  10/23/18 120/70  06/22/18 110/70    Physical Exam Vitals signs and nursing note reviewed.  Constitutional:      General:  She is not in acute distress.    Appearance: She is not diaphoretic.  HENT:     Head: Normocephalic and atraumatic.     Right Ear: Tympanic membrane, ear canal and external ear normal.     Left Ear: Tympanic membrane, ear canal and external ear normal.     Nose: Nose normal. No congestion or rhinorrhea.  Eyes:     General:        Right eye: No discharge.        Left eye: No discharge.     Conjunctiva/sclera: Conjunctivae normal.     Pupils: Pupils are equal, round, and reactive to light.  Neck:     Musculoskeletal: Normal range of motion and neck supple.     Thyroid: No thyromegaly.     Vascular: No JVD.  Cardiovascular:     Rate and Rhythm: Normal rate and regular rhythm.     Heart sounds: Normal heart sounds. No murmur. No friction rub. No gallop.   Pulmonary:     Effort: Pulmonary effort is normal.     Breath sounds: Normal breath sounds. No wheezing or rhonchi.  Abdominal:     General: Bowel sounds are normal.     Palpations: Abdomen is soft. There is no hepatomegaly, splenomegaly or mass.     Tenderness: There is no abdominal tenderness. There is no guarding.  Musculoskeletal: Normal range of motion.  Lymphadenopathy:     Cervical: No cervical adenopathy.  Skin:  General: Skin is warm and dry.  Neurological:     Mental Status: She is alert.     Deep Tendon Reflexes: Reflexes are normal and symmetric.     Wt Readings from Last 3 Encounters:  04/25/19 225 lb (102.1 kg)  10/23/18 222 lb (100.7 kg)  06/22/18 217 lb (98.4 kg)    BP 122/76   Pulse 78   Resp 16   Ht 5\' 6"  (1.676 m)   Wt 225 lb (102.1 kg)   SpO2 98%   BMI 36.32 kg/m   Assessment and Plan:  1. Essential (primary) hypertension Chronic.  Controlled.  Stable.  Will continue lisinopril hydrochlorothiazide 10-12 0.5 once a day.  Reviewed renal panel but will repeat when patient returns for lipid panel see below - lisinopril-hydrochlorothiazide (ZESTORETIC) 10-12.5 MG tablet; Take 1 tablet by mouth  daily.  Dispense: 90 tablet; Refill: 1  2. Aortic atherosclerosis ([place]) Patient has a history of aortic atherosclerosis for which we are controlling the lipid and blood pressure per the medication for lowering risk of plaque development.  3. Need for immunization against influenza Discussed and administered - Flu Vaccine QUAD High Dose(Fluad)  4. Pure hypercholesterolemia On review of patient's lipids they have been in the 1 20-1 30 range we can do better and therefore we will increase her Crestor to 20 mg since diet alone has not been able to bring it with him levels that we prefer.  At this point in time we will have her double up on her Crestor 10 and come in within 1 to 2 weeks of this running out and we will check a lipid panel as well as a renal panel and then send in prescription to her mail-in pharmacy upon noting efficacy and patient's tolerance of medication dosing change. - rosuvastatin (CRESTOR) 20 MG tablet; Take 1 tablet (20 mg total) by mouth daily.  Dispense: 90 tablet; Refill: 1

## 2019-05-11 IMAGING — CT CT ABD-PELV W/ CM
2 of 5 series · 16 of 46 positions shown, 18 images · IV contrast (APPLIED)
Comparison: 05/23/2014

CLINICAL DATA: Right lower quadrant pain.

EXAM:
CT ABDOMEN AND PELVIS WITH CONTRAST
TECHNIQUE: Multidetector CT imaging of the abdomen and pelvis was performed
using the standard protocol following bolus administration of
intravenous contrast.
CONTRAST:  100mL [id number] IOPAMIDOL ([id number]) INJECTION 61%

[Series 2: routine abd/pel with · axial · 0.74mm/px · z∈[-490,-100]mm · 13 of 88 slices shown, 15 images]
[im 5/88  soft-tissue]
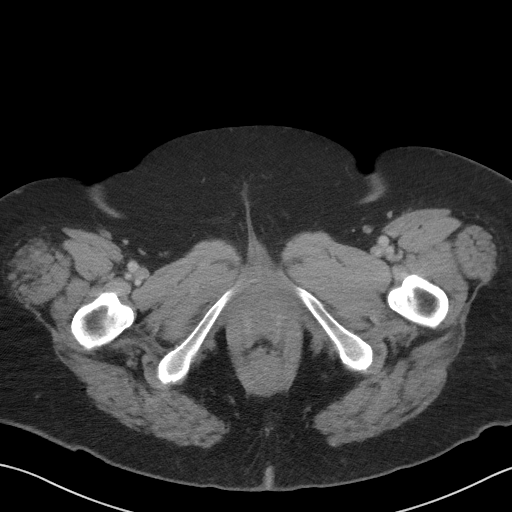
[im 5/88  bone]
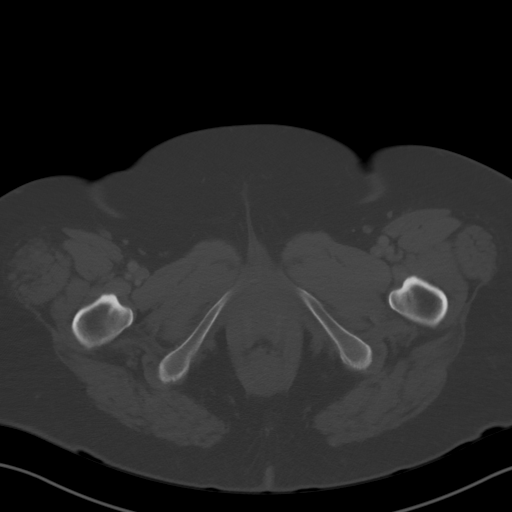
[im 14/88  soft-tissue]
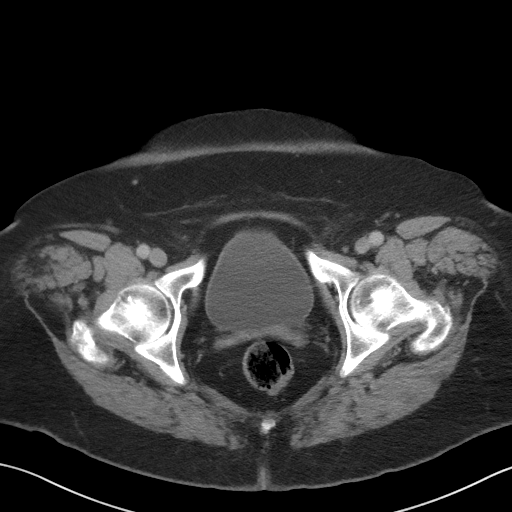
[im 19/88  soft-tissue]
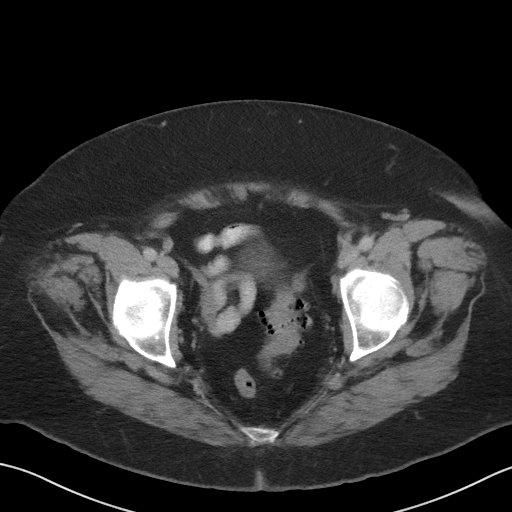
[im 23/88  soft-tissue]
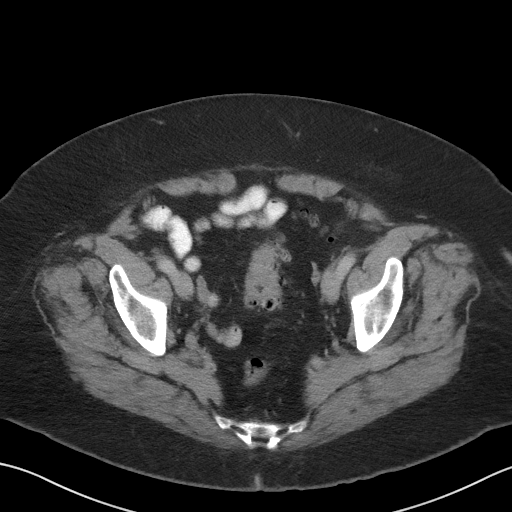
[im 33/88  soft-tissue]
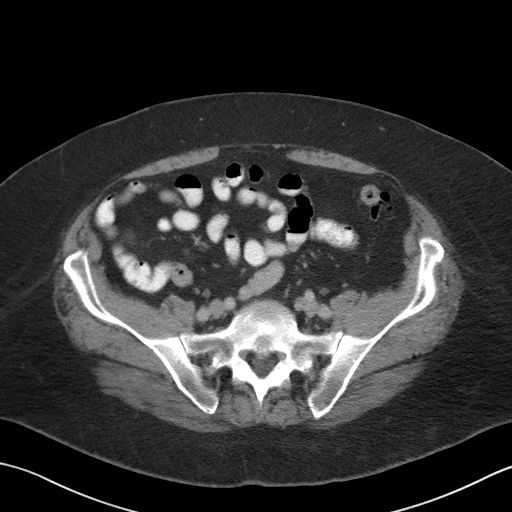
[im 37/88  soft-tissue]
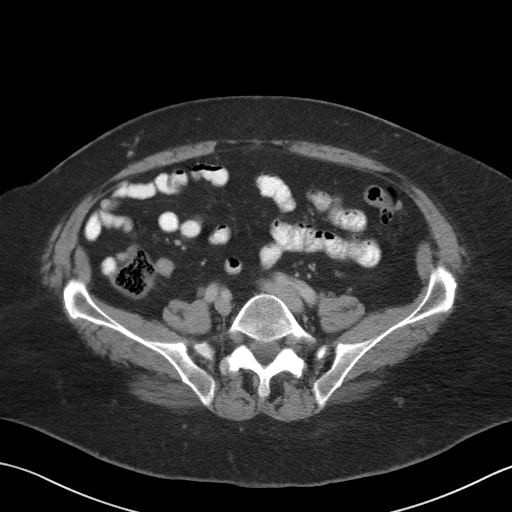
[im 46/88  soft-tissue]
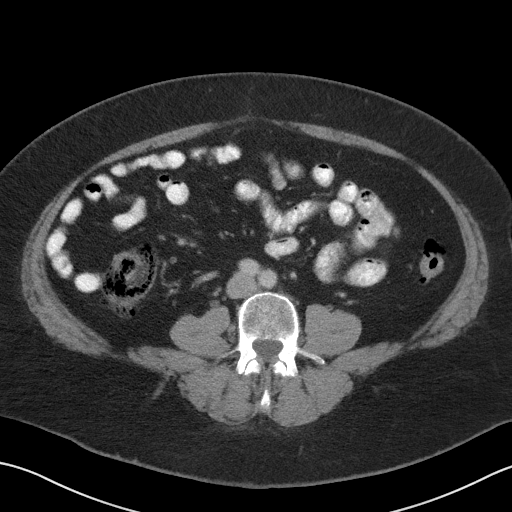
[im 51/88  soft-tissue]
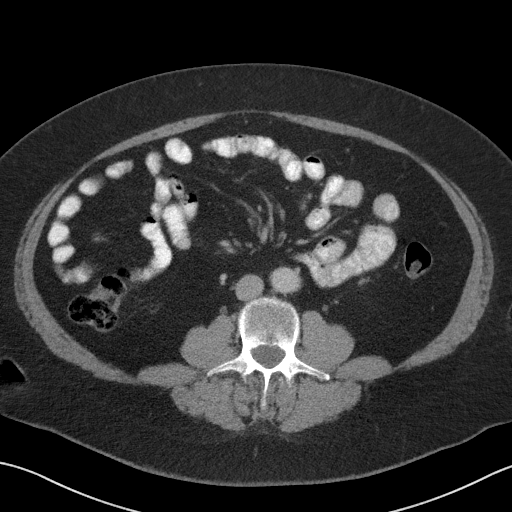
[im 55/88  soft-tissue]
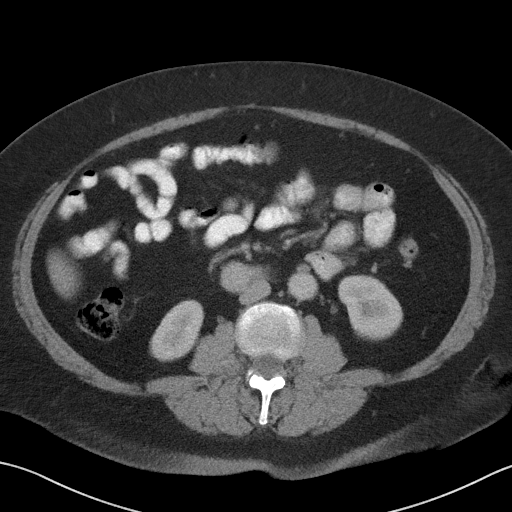
[im 55/88  bone]
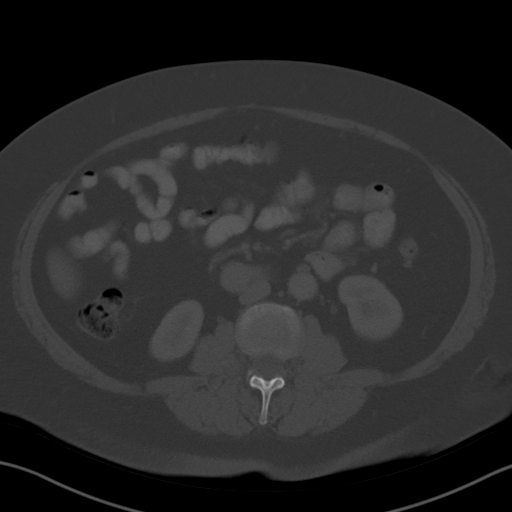
[im 65/88  soft-tissue]
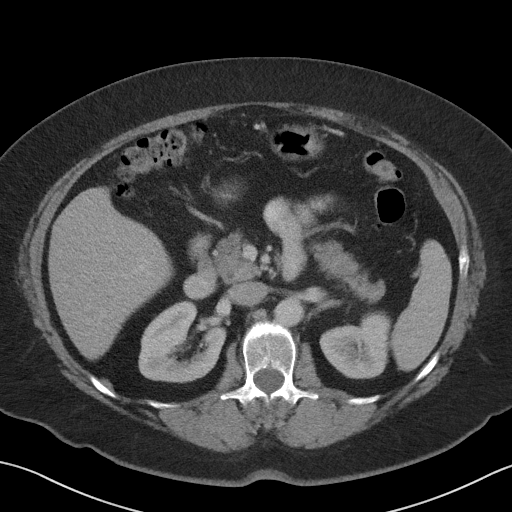
[im 69/88  soft-tissue]
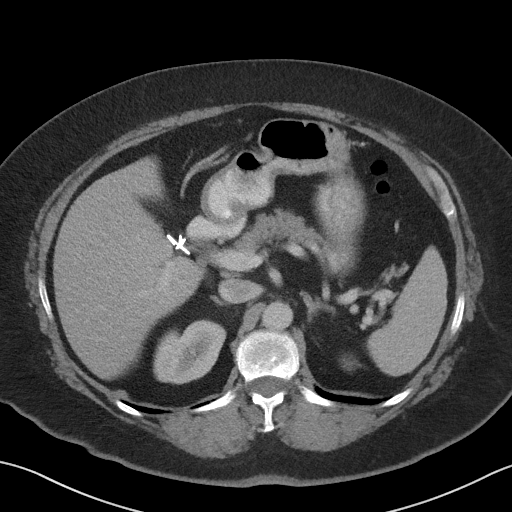
[im 74/88  soft-tissue]
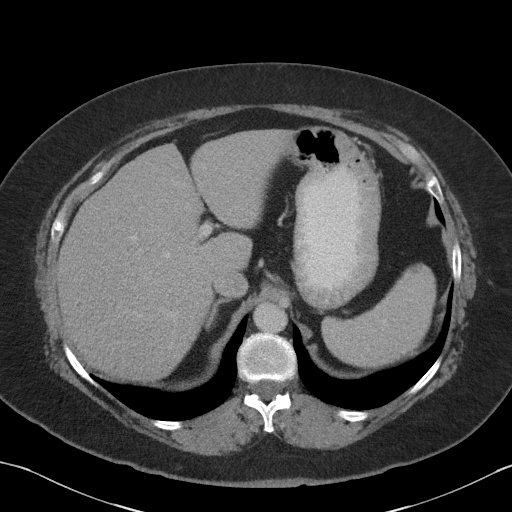
[im 83/88  soft-tissue]
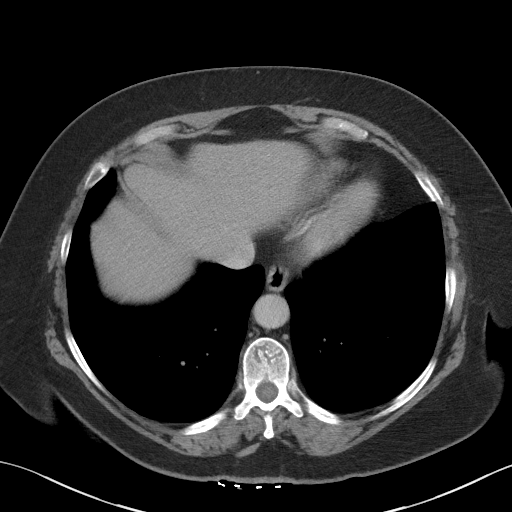

[Series 5: coronal st · coronal · 0.84mm/px · 3 of 95 slices shown]
[im 32/95  soft-tissue]
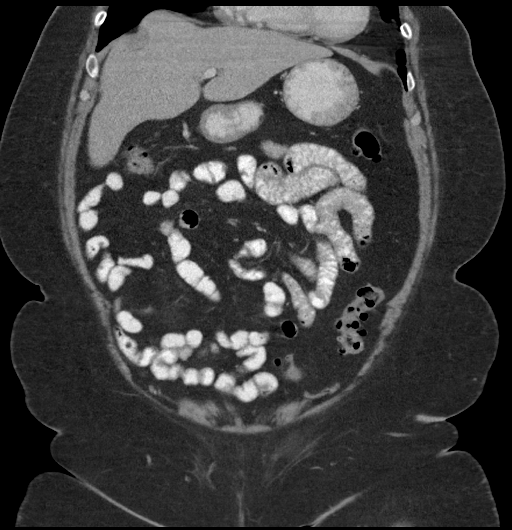
[im 42/95  soft-tissue]
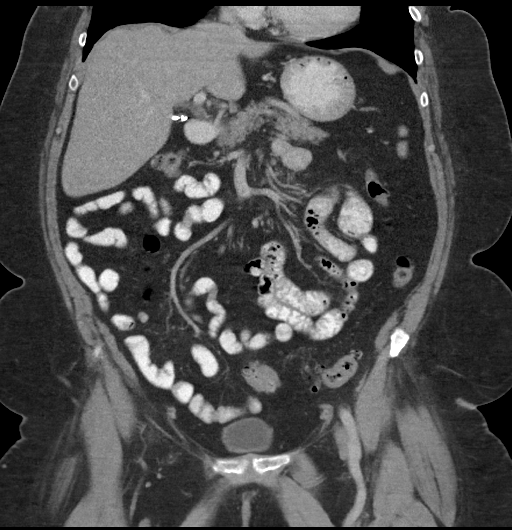
[im 53/95  soft-tissue]
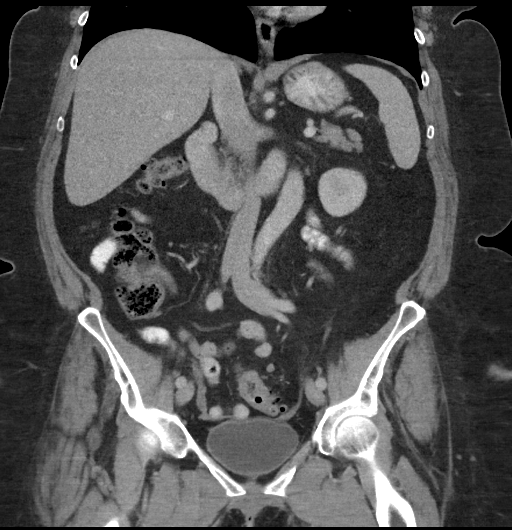

[16 of 46 positions shown; findings below may reference images not displayed]

FINDINGS: Lower chest: Lung bases are clear.  No pleural effusion.

Hepatobiliary: No suspicious liver abnormality. Previous coli
cystectomy. No biliary dilatation.

Pancreas: Unremarkable. No pancreatic ductal dilatation or
surrounding inflammatory changes.

Spleen: Normal in size without focal abnormality.

Adrenals/Urinary Tract: The adrenal glands are normal. Normal
appearance of the right kidney. Low-attenuation structure in the
left kidney measures 8 mm and is too small to characterize. No mass
or hydronephrosis. The urinary bladder appears within normal limits.

Stomach/Bowel: The stomach appears normal. The small bowel loops
have a normal caliber. No evidence for bowel obstruction. The
appendix is visualized and appears normal. No pathologic dilatation
of the colon. Numerous distal colonic diverticula noted. There is
wall thickening involving the sigmoid colon. Very subtle soft tissue
stranding within the adjacent fat is noted, image 62 of series 2.

Vascular/Lymphatic: Aortic atherosclerosis. No aneurysm. No upper
abdominal adenopathy. No pelvic or inguinal adenopathy.

Reproductive: Status post hysterectomy. No adnexal masses.

Other: No abdominal wall hernia or abnormality. No abdominopelvic
ascites.

Musculoskeletal: No acute or significant osseous findings.
IMPRESSION: 1. Extensive sigmoid diverticulosis. Wall thickening of the sigmoid
colon may reflect acute or chronic diverticular disease. There is
very subtle soft tissue stranding which may reflect mild
uncomplicated diverticulitis. Careful clinical correlation is
advised.
2. The appendix is visualized and appears normal.
3.  Aortic Atherosclerosis ([id number]).

## 2019-06-13 ENCOUNTER — Other Ambulatory Visit: Payer: Self-pay

## 2019-06-13 ENCOUNTER — Other Ambulatory Visit

## 2019-06-13 DIAGNOSIS — R69 Illness, unspecified: Secondary | ICD-10-CM

## 2019-06-13 DIAGNOSIS — I10 Essential (primary) hypertension: Secondary | ICD-10-CM

## 2019-06-13 DIAGNOSIS — E782 Mixed hyperlipidemia: Secondary | ICD-10-CM

## 2019-06-13 NOTE — Progress Notes (Signed)
Labs printed.

## 2019-06-14 LAB — RENAL FUNCTION PANEL
Albumin: 4.2 g/dL (ref 3.8–4.8)
BUN/Creatinine Ratio: 14 (ref 12–28)
BUN: 10 mg/dL (ref 8–27)
CO2: 23 mmol/L (ref 20–29)
Calcium: 9.5 mg/dL (ref 8.7–10.3)
Chloride: 104 mmol/L (ref 96–106)
Creatinine, Ser: 0.71 mg/dL (ref 0.57–1.00)
GFR calc Af Amer: 104 mL/min/{1.73_m2} (ref >59)
GFR calc non Af Amer: 90 mL/min/{1.73_m2} (ref >59)
Glucose: 90 mg/dL (ref 65–99)
Phosphorus: 3.7 mg/dL (ref 3.0–4.3)
Potassium: 4.4 mmol/L (ref 3.5–5.2)
Sodium: 140 mmol/L (ref 134–144)

## 2019-06-14 LAB — HEPATIC FUNCTION PANEL
ALT: 28 IU/L (ref 0–32)
AST: 25 IU/L (ref 0–40)
Alkaline Phosphatase: 77 IU/L (ref 39–117)
Bilirubin Total: 0.5 mg/dL (ref 0.0–1.2)
Bilirubin, Direct: 0.2 mg/dL (ref 0.00–0.40)
Total Protein: 6.6 g/dL (ref 6.0–8.5)

## 2019-06-14 LAB — LIPID PANEL WITH LDL/HDL RATIO
Cholesterol, Total: 172 mg/dL (ref 100–199)
HDL: 60 mg/dL (ref >39)
LDL Chol Calc (NIH): 87 mg/dL (ref 0–99)
LDL/HDL Ratio: 1.5 ratio (ref 0.0–3.2)
Triglycerides: 143 mg/dL (ref 0–149)
VLDL Cholesterol Cal: 25 mg/dL (ref 5–40)

## 2019-06-18 ENCOUNTER — Other Ambulatory Visit: Payer: Self-pay | Admitting: Family Medicine

## 2019-06-18 ENCOUNTER — Other Ambulatory Visit: Payer: Self-pay

## 2019-06-18 DIAGNOSIS — E782 Mixed hyperlipidemia: Secondary | ICD-10-CM

## 2019-06-18 DIAGNOSIS — I10 Essential (primary) hypertension: Secondary | ICD-10-CM

## 2019-06-18 DIAGNOSIS — H8309 Labyrinthitis, unspecified ear: Secondary | ICD-10-CM

## 2019-06-18 DIAGNOSIS — E7800 Pure hypercholesterolemia, unspecified: Secondary | ICD-10-CM

## 2019-06-18 MED ORDER — MECLIZINE HCL 25 MG PO TABS: 25.0000 mg | ORAL_TABLET | Freq: Three times a day (TID) | ORAL | 0 refills | Status: DC | PRN

## 2019-06-18 MED ORDER — ROSUVASTATIN CALCIUM 20 MG PO TABS: 20.0000 mg | ORAL_TABLET | Freq: Every day | ORAL | 1 refills | Status: DC

## 2019-06-18 MED ORDER — LISINOPRIL-HYDROCHLOROTHIAZIDE 10-12.5 MG PO TABS: 1.0000 | ORAL_TABLET | Freq: Every day | ORAL | 1 refills | Status: DC

## 2019-07-11 DIAGNOSIS — M9903 Segmental and somatic dysfunction of lumbar region: Secondary | ICD-10-CM | POA: Diagnosis not present

## 2019-07-11 DIAGNOSIS — M5136 Other intervertebral disc degeneration, lumbar region: Secondary | ICD-10-CM | POA: Diagnosis not present

## 2019-07-11 DIAGNOSIS — M5416 Radiculopathy, lumbar region: Secondary | ICD-10-CM | POA: Diagnosis not present

## 2019-07-11 DIAGNOSIS — M9905 Segmental and somatic dysfunction of pelvic region: Secondary | ICD-10-CM | POA: Diagnosis not present

## 2019-07-17 DIAGNOSIS — Z800 Family history of malignant neoplasm of digestive organs: Secondary | ICD-10-CM | POA: Diagnosis not present

## 2019-07-17 DIAGNOSIS — K219 Gastro-esophageal reflux disease without esophagitis: Secondary | ICD-10-CM | POA: Diagnosis not present

## 2019-07-17 DIAGNOSIS — K5150 Left sided colitis without complications: Secondary | ICD-10-CM | POA: Diagnosis not present

## 2019-08-08 DIAGNOSIS — M9903 Segmental and somatic dysfunction of lumbar region: Secondary | ICD-10-CM | POA: Diagnosis not present

## 2019-08-08 DIAGNOSIS — M5136 Other intervertebral disc degeneration, lumbar region: Secondary | ICD-10-CM | POA: Diagnosis not present

## 2019-08-08 DIAGNOSIS — M5416 Radiculopathy, lumbar region: Secondary | ICD-10-CM | POA: Diagnosis not present

## 2019-08-08 DIAGNOSIS — M9905 Segmental and somatic dysfunction of pelvic region: Secondary | ICD-10-CM | POA: Diagnosis not present

## 2019-08-29 DIAGNOSIS — M9905 Segmental and somatic dysfunction of pelvic region: Secondary | ICD-10-CM | POA: Diagnosis not present

## 2019-08-29 DIAGNOSIS — M5416 Radiculopathy, lumbar region: Secondary | ICD-10-CM | POA: Diagnosis not present

## 2019-08-29 DIAGNOSIS — M5136 Other intervertebral disc degeneration, lumbar region: Secondary | ICD-10-CM | POA: Diagnosis not present

## 2019-08-29 DIAGNOSIS — M9903 Segmental and somatic dysfunction of lumbar region: Secondary | ICD-10-CM | POA: Diagnosis not present

## 2019-09-19 DIAGNOSIS — M5416 Radiculopathy, lumbar region: Secondary | ICD-10-CM | POA: Diagnosis not present

## 2019-09-19 DIAGNOSIS — M9905 Segmental and somatic dysfunction of pelvic region: Secondary | ICD-10-CM | POA: Diagnosis not present

## 2019-09-19 DIAGNOSIS — M9903 Segmental and somatic dysfunction of lumbar region: Secondary | ICD-10-CM | POA: Diagnosis not present

## 2019-09-19 DIAGNOSIS — M5136 Other intervertebral disc degeneration, lumbar region: Secondary | ICD-10-CM | POA: Diagnosis not present

## 2019-09-20 DIAGNOSIS — H35371 Puckering of macula, right eye: Secondary | ICD-10-CM | POA: Diagnosis not present

## 2019-09-26 DIAGNOSIS — I493 Ventricular premature depolarization: Secondary | ICD-10-CM | POA: Diagnosis not present

## 2019-09-26 DIAGNOSIS — E782 Mixed hyperlipidemia: Secondary | ICD-10-CM | POA: Diagnosis not present

## 2019-09-26 DIAGNOSIS — R002 Palpitations: Secondary | ICD-10-CM | POA: Diagnosis not present

## 2019-09-26 DIAGNOSIS — I10 Essential (primary) hypertension: Secondary | ICD-10-CM | POA: Diagnosis not present

## 2019-10-09 ENCOUNTER — Other Ambulatory Visit: Payer: Self-pay

## 2019-10-09 DIAGNOSIS — E7800 Pure hypercholesterolemia, unspecified: Secondary | ICD-10-CM

## 2019-10-09 DIAGNOSIS — I10 Essential (primary) hypertension: Secondary | ICD-10-CM

## 2019-10-09 MED ORDER — ROSUVASTATIN CALCIUM 20 MG PO TABS: 20.0000 mg | ORAL_TABLET | Freq: Every day | ORAL | 0 refills | Status: DC

## 2019-10-09 MED ORDER — LISINOPRIL-HYDROCHLOROTHIAZIDE 10-12.5 MG PO TABS: 1.0000 | ORAL_TABLET | Freq: Every day | ORAL | 0 refills | Status: DC

## 2019-10-10 DIAGNOSIS — M9905 Segmental and somatic dysfunction of pelvic region: Secondary | ICD-10-CM | POA: Diagnosis not present

## 2019-10-10 DIAGNOSIS — M9903 Segmental and somatic dysfunction of lumbar region: Secondary | ICD-10-CM | POA: Diagnosis not present

## 2019-10-10 DIAGNOSIS — M5416 Radiculopathy, lumbar region: Secondary | ICD-10-CM | POA: Diagnosis not present

## 2019-10-10 DIAGNOSIS — M5136 Other intervertebral disc degeneration, lumbar region: Secondary | ICD-10-CM | POA: Diagnosis not present

## 2019-10-24 ENCOUNTER — Encounter: Payer: Self-pay | Admitting: Family Medicine

## 2019-10-24 ENCOUNTER — Ambulatory Visit (INDEPENDENT_AMBULATORY_CARE_PROVIDER_SITE_OTHER): Payer: Medicare Other | Admitting: Family Medicine

## 2019-10-24 ENCOUNTER — Other Ambulatory Visit: Payer: Self-pay

## 2019-10-24 VITALS — BP 130/80 | HR 60 | Ht 66.0 in | Wt 229.0 lb

## 2019-10-24 DIAGNOSIS — I10 Essential (primary) hypertension: Secondary | ICD-10-CM | POA: Diagnosis not present

## 2019-10-24 DIAGNOSIS — H8309 Labyrinthitis, unspecified ear: Secondary | ICD-10-CM

## 2019-10-24 DIAGNOSIS — B379 Candidiasis, unspecified: Secondary | ICD-10-CM | POA: Diagnosis not present

## 2019-10-24 DIAGNOSIS — E7800 Pure hypercholesterolemia, unspecified: Secondary | ICD-10-CM

## 2019-10-24 MED ORDER — MECLIZINE HCL 25 MG PO TABS: 25.0000 mg | ORAL_TABLET | Freq: Three times a day (TID) | ORAL | 0 refills | Status: DC | PRN

## 2019-10-24 MED ORDER — ROSUVASTATIN CALCIUM 20 MG PO TABS: 20.0000 mg | ORAL_TABLET | Freq: Every day | ORAL | 0 refills | Status: DC

## 2019-10-24 MED ORDER — LISINOPRIL-HYDROCHLOROTHIAZIDE 10-12.5 MG PO TABS: 1.0000 | ORAL_TABLET | Freq: Every day | ORAL | 0 refills | Status: DC

## 2019-10-24 MED ORDER — NYSTATIN-TRIAMCINOLONE 100000-0.1 UNIT/GM-% EX CREA: 1.0000 "application " | TOPICAL_CREAM | Freq: Two times a day (BID) | CUTANEOUS | 1 refills | Status: AC

## 2019-10-24 NOTE — Progress Notes (Signed)
Date:  10/24/2019   Name:  Theresa Gross   DOB:  [birth year]   MRN:  [id number]   Chief Complaint: Hyperlipidemia, Hypertension, Dizziness, and Rash (gets yeast around waist)  Hyperlipidemia This is a chronic problem. The current episode started more than 1 year ago. The problem is controlled. Recent lipid tests were reviewed and are normal. She has no history of chronic renal disease, diabetes, hypothyroidism, liver disease, obesity or nephrotic syndrome. Factors aggravating her hyperlipidemia include thiazides. Pertinent negatives include no chest pain, focal sensory loss, focal weakness, leg pain, myalgias or shortness of breath. She is currently on no antihyperlipidemic treatment. The current treatment provides moderate improvement of lipids. There are no compliance problems.  Risk factors for coronary artery disease include dyslipidemia and hypertension.  Hypertension This is a chronic problem. The current episode started more than 1 year ago. The problem has been gradually improving since onset. The problem is controlled. Pertinent negatives include no anxiety, blurred vision, chest pain, headaches, malaise/fatigue, neck pain, orthopnea, palpitations, peripheral edema, PND, shortness of breath or sweats. There are no associated agents to hypertension. Risk factors for coronary artery disease include dyslipidemia. Past treatments include ACE inhibitors and diuretics. The current treatment provides moderate improvement. There are no compliance problems.  There is no history of angina, kidney disease, CAD/MI, CVA, heart failure, left ventricular hypertrophy, PVD or retinopathy. There is no history of chronic renal disease, a hypertension causing med or renovascular disease.  Dizziness This is a chronic problem. The current episode started more than 1 year ago. The problem has been gradually improving. Associated symptoms include a rash. Pertinent negatives include no abdominal pain, arthralgias,  chest pain, chills, congestion, coughing, fatigue, fever, headaches, myalgias, nausea, neck pain, numbness, sore throat, vomiting or weakness.  Rash Pertinent negatives include no congestion, cough, diarrhea, eye pain, fatigue, fever, rhinorrhea, shortness of breath, sore throat or vomiting.    Lab Results  Component Value Date   CREATININE 0.71 06/13/2019   BUN 10 06/13/2019   NA 140 06/13/2019   K 4.4 06/13/2019   CL 104 06/13/2019   CO2 23 06/13/2019   Lab Results  Component Value Date   CHOL 172 06/13/2019   HDL 60 06/13/2019   LDLCALC 87 06/13/2019   TRIG 143 06/13/2019   CHOLHDL 4.6 (H) 08/23/2017   No results found for: TSH No results found for: HGBA1C Lab Results  Component Value Date   WBC 6.9 05/17/2013   HGB 13.7 05/17/2013   HCT 39.4 05/17/2013   MCV 94 05/17/2013   PLT 241 05/17/2013   Lab Results  Component Value Date   ALT 28 06/13/2019   AST 25 06/13/2019   ALKPHOS 77 06/13/2019   BILITOT 0.5 06/13/2019     Review of Systems  Constitutional: Negative.  Negative for chills, fatigue, fever, malaise/fatigue and unexpected weight change.  HENT: Negative for congestion, ear discharge, ear pain, rhinorrhea, sinus pressure, sneezing and sore throat.   Eyes: Negative for blurred vision, photophobia, pain, discharge, redness and itching.  Respiratory: Negative for cough, shortness of breath, wheezing and stridor.   Cardiovascular: Negative for chest pain, palpitations, orthopnea and PND.  Gastrointestinal: Negative for abdominal pain, blood in stool, constipation, diarrhea, nausea and vomiting.  Endocrine: Negative for cold intolerance, heat intolerance, polydipsia, polyphagia and polyuria.  Genitourinary: Negative for dysuria, flank pain, frequency, hematuria, menstrual problem, pelvic pain, urgency, vaginal bleeding and vaginal discharge.  Musculoskeletal: Negative for arthralgias, back pain, myalgias and neck pain.  Skin:  Positive for rash.    Allergic/Immunologic: Negative for environmental allergies and food allergies.  Neurological: Positive for dizziness. Negative for focal weakness, weakness, light-headedness, numbness and headaches.  Hematological: Negative for adenopathy. Does not bruise/bleed easily.  Psychiatric/Behavioral: Negative for dysphoric mood. The patient is not nervous/anxious.     Patient Active Problem List   Diagnosis Date Noted  . Class 2 obesity due to excess calories without serious comorbidity with body mass index (BMI) of 37.0 to 37.9 in adult 02/16/2017  . Pure hypercholesterolemia 02/16/2017  . Aortic atherosclerosis ([place]) 05/25/2016  . Steatosis of liver 05/25/2016  . Mixed hyperlipidemia 05/25/2016  . Essential (primary) hypertension 10/28/2014  . Gastro-esophageal reflux disease without esophagitis 10/28/2014  . H/O psoriasis 10/28/2014  . Decreased potassium in the blood 10/28/2014  . Familial multiple lipoprotein-type hyperlipidemia 10/28/2014    Allergies  Allergen Reactions  . Lipitor  [Atorvastatin]   . Metronidazole   . Nsaids Other (See Comments)    Have to be careful due to liver    Past Surgical History:  Procedure Laterality Date  . ABDOMINAL HYSTERECTOMY    . BREAST BIOPSY Right 10 plus yrs ago   stereo bx./clip. Benign  . CHOLECYSTECTOMY    . COLONOSCOPY WITH PROPOFOL N/A 06/16/2016   Procedure: COLONOSCOPY WITH PROPOFOL;  Surgeon: Manya Silvas, MD;  Location[facility]COPY;  Service: Endoscopy;  Laterality: N/A;  . DIAGNOSTIC LAPAROSCOPY    . EYE SURGERY    . GALLBLADDER SURGERY  06/04/2014    Social History   Tobacco Use  . Smoking status: Former Research scientist (life sciences)  . Smokeless tobacco: Never Used  Substance Use Topics  . Alcohol use: Yes    Alcohol/week: 1.0 standard drinks    Types: 1 Cans of beer per week  . Drug use: No     Medication list has been reviewed and updated.  Current Meds  Medication Sig  . Apremilast 30 MG TABS Take 1 tablet by mouth 2 (two)  times daily. Derm  . carbamide peroxide (DEBROX) 6.5 % OTIC solution Place 5 drops into the right ear 2 (two) times daily.  . Cholecalciferol (VITAMIN D) 2000 units tablet Take 2,000 Units by mouth daily.  . DELZICOL 400 MG CPDR DR capsule Take 2 tablets by mouth 2 (two) times daily. GI Dr  . lansoprazole (PREVACID) 30 MG capsule Take 1 capsule by mouth daily. GI Doc  . lisinopril-hydrochlorothiazide (ZESTORETIC) 10-12.5 MG tablet Take 1 tablet by mouth daily.  . meclizine (ANTIVERT) 25 MG tablet Take 1 tablet (25 mg total) by mouth 3 (three) times daily as needed for dizziness.  . nystatin-triamcinolone (MYCOLOG II) cream Apply 1 application topically 2 (two) times daily. PRN/ Derm  . rosuvastatin (CRESTOR) 20 MG tablet Take 1 tablet (20 mg total) by mouth daily.    PHQ 2/9 Scores 10/24/2019 10/23/2018 04/17/2018 02/16/2017  PHQ - 2 Score 0 0 0 0  PHQ- 9 Score 0 0 0 0    BP Readings from Last 3 Encounters:  10/24/19 130/80  04/25/19 122/76  10/23/18 120/70    Physical Exam Vitals and nursing note reviewed.  Constitutional:      Appearance: She is well-developed.  HENT:     Head: Normocephalic.     Right Ear: Tympanic membrane, ear canal and external ear normal.     Left Ear: Tympanic membrane, ear canal and external ear normal.     Nose: Nose normal.     Mouth/Throat:     Pharynx: Oropharynx is clear.  No oropharyngeal exudate or posterior oropharyngeal erythema.  Eyes:     General: Lids are everted, no foreign bodies appreciated. No scleral icterus.       Left eye: No foreign body or hordeolum.     Conjunctiva/sclera: Conjunctivae normal.     Right eye: Right conjunctiva is not injected.     Left eye: Left conjunctiva is not injected.     Pupils: Pupils are equal, round, and reactive to light.  Neck:     Thyroid: No thyromegaly.     Vascular: No JVD.     Trachea: No tracheal deviation.  Cardiovascular:     Rate and Rhythm: Normal rate and regular rhythm.     Heart sounds:  Normal heart sounds. No murmur. No friction rub. No gallop.   Pulmonary:     Effort: Pulmonary effort is normal. No respiratory distress.     Breath sounds: Normal breath sounds. No wheezing, rhonchi or rales.  Chest:     Chest wall: No tenderness.  Abdominal:     General: Bowel sounds are normal.     Palpations: Abdomen is soft. There is no mass.     Tenderness: There is no abdominal tenderness. There is no guarding or rebound.  Musculoskeletal:        General: No tenderness. Normal range of motion.     Cervical back: Normal range of motion and neck supple.  Lymphadenopathy:     Cervical: No cervical adenopathy.  Skin:    General: Skin is warm.     Findings: No rash.  Neurological:     Mental Status: She is alert and oriented to person, place, and time.     Cranial Nerves: No cranial nerve deficit.     Deep Tendon Reflexes: Reflexes normal.  Psychiatric:        Mood and Affect: Mood is not anxious or depressed.     Wt Readings from Last 3 Encounters:  10/24/19 229 lb (103.9 kg)  04/25/19 225 lb (102.1 kg)  10/23/18 222 lb (100.7 kg)    BP 130/80   Pulse 60   Ht 5\' 6"  (1.676 m)   Wt 229 lb (103.9 kg)   BMI 36.96 kg/m   Assessment and Plan: 1. Essential (primary) hypertension Chronic.  Controlled.  Stable.  Continue lisinopril hydrochlorothiazide 10-12.5 mg daily.  Reviewed most recent renal panel. - lisinopril-hydrochlorothiazide (ZESTORETIC) 10-12.5 MG tablet; Take 1 tablet by mouth daily.  Dispense: 90 tablet; Refill: 0  2. Labyrinthitis, unspecified laterality Chronic.  Controlled.  Stable.  Continue meclizine 25 mg 3 times a day as needed for vertigo. - meclizine (ANTIVERT) 25 MG tablet; Take 1 tablet (25 mg total) by mouth 3 (three) times daily as needed for dizziness.  Dispense: 90 tablet; Refill: 0  3. Candidiasis Chronic.  Controlled.  Stable.  Continue Mycolog cream to be applied to area of the pannus for candidiasis rash.  Patient may use this twice a day  and has been instructed in case there is bacteria bacterial infection that she may need to use Neosporin combined.  4. Pure hypercholesterolemia Chronic.  Controlled.  Stable.  Continue rosuvastatin 20 mg once a day.  Reviewed previous lipid panel is unremarkable. - rosuvastatin (CRESTOR) 20 MG tablet; Take 1 tablet (20 mg total) by mouth daily.  Dispense: 90 tablet; Refill: 0

## 2019-10-26 ENCOUNTER — Other Ambulatory Visit: Payer: Self-pay

## 2019-10-26 DIAGNOSIS — E7800 Pure hypercholesterolemia, unspecified: Secondary | ICD-10-CM

## 2019-10-26 DIAGNOSIS — I10 Essential (primary) hypertension: Secondary | ICD-10-CM

## 2019-10-26 MED ORDER — LISINOPRIL-HYDROCHLOROTHIAZIDE 10-12.5 MG PO TABS: 1.0000 | ORAL_TABLET | Freq: Every day | ORAL | 0 refills | Status: DC

## 2019-10-26 MED ORDER — ROSUVASTATIN CALCIUM 20 MG PO TABS: 20.0000 mg | ORAL_TABLET | Freq: Every day | ORAL | 0 refills | Status: DC

## 2019-10-26 NOTE — Progress Notes (Unsigned)
Sent in emergency fill on crestor and b/p med to [facility]

## 2019-10-31 DIAGNOSIS — M5136 Other intervertebral disc degeneration, lumbar region: Secondary | ICD-10-CM | POA: Diagnosis not present

## 2019-10-31 DIAGNOSIS — M9903 Segmental and somatic dysfunction of lumbar region: Secondary | ICD-10-CM | POA: Diagnosis not present

## 2019-10-31 DIAGNOSIS — M5416 Radiculopathy, lumbar region: Secondary | ICD-10-CM | POA: Diagnosis not present

## 2019-10-31 DIAGNOSIS — M9905 Segmental and somatic dysfunction of pelvic region: Secondary | ICD-10-CM | POA: Diagnosis not present

## 2019-11-12 DIAGNOSIS — R55 Syncope and collapse: Secondary | ICD-10-CM | POA: Diagnosis not present

## 2019-11-12 DIAGNOSIS — R42 Dizziness and giddiness: Secondary | ICD-10-CM | POA: Diagnosis not present

## 2019-11-21 DIAGNOSIS — M5416 Radiculopathy, lumbar region: Secondary | ICD-10-CM | POA: Diagnosis not present

## 2019-11-21 DIAGNOSIS — M9905 Segmental and somatic dysfunction of pelvic region: Secondary | ICD-10-CM | POA: Diagnosis not present

## 2019-11-21 DIAGNOSIS — M5136 Other intervertebral disc degeneration, lumbar region: Secondary | ICD-10-CM | POA: Diagnosis not present

## 2019-11-21 DIAGNOSIS — M9903 Segmental and somatic dysfunction of lumbar region: Secondary | ICD-10-CM | POA: Diagnosis not present

## 2019-12-12 DIAGNOSIS — M5136 Other intervertebral disc degeneration, lumbar region: Secondary | ICD-10-CM | POA: Diagnosis not present

## 2019-12-12 DIAGNOSIS — M5416 Radiculopathy, lumbar region: Secondary | ICD-10-CM | POA: Diagnosis not present

## 2019-12-12 DIAGNOSIS — M9905 Segmental and somatic dysfunction of pelvic region: Secondary | ICD-10-CM | POA: Diagnosis not present

## 2019-12-12 DIAGNOSIS — M9903 Segmental and somatic dysfunction of lumbar region: Secondary | ICD-10-CM | POA: Diagnosis not present

## 2019-12-13 ENCOUNTER — Other Ambulatory Visit: Payer: Self-pay

## 2019-12-13 ENCOUNTER — Other Ambulatory Visit
Admission: RE | Admit: 2019-12-13 | Discharge: 2019-12-13 | Disposition: A | Discharge status: Home / Self Care | Payer: Medicare Other | Source: Ambulatory Visit | Attending: Internal Medicine | Admitting: Internal Medicine

## 2019-12-13 DIAGNOSIS — Z20822 Contact with and (suspected) exposure to covid-19: Secondary | ICD-10-CM | POA: Diagnosis not present

## 2019-12-13 DIAGNOSIS — Z01812 Encounter for preprocedural laboratory examination: Secondary | ICD-10-CM | POA: Insufficient documentation

## 2019-12-14 ENCOUNTER — Encounter: Payer: Self-pay | Admitting: Internal Medicine

## 2019-12-14 LAB — SARS CORONAVIRUS 2 (TAT 6-24 HRS): SARS Coronavirus 2: NEGATIVE

## 2019-12-17 ENCOUNTER — Other Ambulatory Visit: Payer: Self-pay

## 2019-12-17 ENCOUNTER — Ambulatory Visit: Payer: Medicare Other | Admitting: Anesthesiology

## 2019-12-17 ENCOUNTER — Ambulatory Visit
Admission: RE | Admit: 2019-12-17 | Discharge: 2019-12-17 | Disposition: A | Discharge status: Home / Self Care | Payer: Medicare Other | Attending: Internal Medicine | Admitting: Internal Medicine

## 2019-12-17 ENCOUNTER — Encounter
Admission: RE | Disposition: A | Discharge status: Home / Self Care | Payer: Self-pay | Source: Home / Self Care | Attending: Internal Medicine

## 2019-12-17 ENCOUNTER — Encounter: Payer: Self-pay | Admitting: Internal Medicine

## 2019-12-17 DIAGNOSIS — D131 Benign neoplasm of stomach: Secondary | ICD-10-CM | POA: Diagnosis not present

## 2019-12-17 DIAGNOSIS — K449 Diaphragmatic hernia without obstruction or gangrene: Secondary | ICD-10-CM | POA: Diagnosis not present

## 2019-12-17 DIAGNOSIS — K5150 Left sided colitis without complications: Secondary | ICD-10-CM | POA: Diagnosis not present

## 2019-12-17 DIAGNOSIS — K640 First degree hemorrhoids: Secondary | ICD-10-CM | POA: Insufficient documentation

## 2019-12-17 DIAGNOSIS — Z8719 Personal history of other diseases of the digestive system: Secondary | ICD-10-CM | POA: Insufficient documentation

## 2019-12-17 DIAGNOSIS — E559 Vitamin D deficiency, unspecified: Secondary | ICD-10-CM | POA: Insufficient documentation

## 2019-12-17 DIAGNOSIS — Z800 Family history of malignant neoplasm of digestive organs: Secondary | ICD-10-CM | POA: Diagnosis not present

## 2019-12-17 DIAGNOSIS — K317 Polyp of stomach and duodenum: Secondary | ICD-10-CM | POA: Insufficient documentation

## 2019-12-17 DIAGNOSIS — I10 Essential (primary) hypertension: Secondary | ICD-10-CM | POA: Insufficient documentation

## 2019-12-17 DIAGNOSIS — Z79899 Other long term (current) drug therapy: Secondary | ICD-10-CM | POA: Insufficient documentation

## 2019-12-17 DIAGNOSIS — Z1211 Encounter for screening for malignant neoplasm of colon: Secondary | ICD-10-CM | POA: Diagnosis not present

## 2019-12-17 DIAGNOSIS — Z87891 Personal history of nicotine dependence: Secondary | ICD-10-CM | POA: Diagnosis not present

## 2019-12-17 DIAGNOSIS — K5730 Diverticulosis of large intestine without perforation or abscess without bleeding: Secondary | ICD-10-CM | POA: Insufficient documentation

## 2019-12-17 DIAGNOSIS — K648 Other hemorrhoids: Secondary | ICD-10-CM | POA: Diagnosis not present

## 2019-12-17 DIAGNOSIS — K2100 Gastro-esophageal reflux disease with esophagitis, without bleeding: Secondary | ICD-10-CM | POA: Insufficient documentation

## 2019-12-17 DIAGNOSIS — Z7982 Long term (current) use of aspirin: Secondary | ICD-10-CM | POA: Diagnosis not present

## 2019-12-17 DIAGNOSIS — Z8541 Personal history of malignant neoplasm of cervix uteri: Secondary | ICD-10-CM | POA: Diagnosis not present

## 2019-12-17 DIAGNOSIS — K219 Gastro-esophageal reflux disease without esophagitis: Secondary | ICD-10-CM | POA: Diagnosis not present

## 2019-12-17 DIAGNOSIS — E785 Hyperlipidemia, unspecified: Secondary | ICD-10-CM | POA: Diagnosis not present

## 2019-12-17 DIAGNOSIS — K5790 Diverticulosis of intestine, part unspecified, without perforation or abscess without bleeding: Secondary | ICD-10-CM | POA: Diagnosis not present

## 2019-12-17 HISTORY — DX: Left sided colitis without complications: K51.50

## 2019-12-17 HISTORY — PX: COLONOSCOPY WITH PROPOFOL: SHX5780

## 2019-12-17 HISTORY — PX: ESOPHAGOGASTRODUODENOSCOPY (EGD) WITH PROPOFOL: SHX5813

## 2019-12-17 SURGERY — ESOPHAGOGASTRODUODENOSCOPY (EGD) WITH PROPOFOL
Anesthesia: General

## 2019-12-17 MED ORDER — EPHEDRINE 5 MG/ML INJ
INTRAVENOUS | Status: AC
  Filled 2019-12-17: qty 10

## 2019-12-17 MED ORDER — PROPOFOL 500 MG/50ML IV EMUL
INTRAVENOUS | Status: AC
  Filled 2019-12-17: qty 50

## 2019-12-17 MED ORDER — EPHEDRINE SULFATE 50 MG/ML IJ SOLN
INTRAMUSCULAR | Status: DC | PRN
  Administered 2019-12-17 (×2): 10 mg via INTRAVENOUS

## 2019-12-17 MED ORDER — LIDOCAINE HCL (PF) 2 % IJ SOLN
INTRAMUSCULAR | Status: DC | PRN
  Administered 2019-12-17: 60 mg via INTRADERMAL

## 2019-12-17 MED ORDER — SODIUM CHLORIDE 0.9 % IV SOLN: INTRAVENOUS | Status: DC

## 2019-12-17 MED ORDER — PROPOFOL 10 MG/ML IV BOLUS
INTRAVENOUS | Status: DC | PRN
  Administered 2019-12-17: 50 mg via INTRAVENOUS
  Administered 2019-12-17: 20 mg via INTRAVENOUS
  Administered 2019-12-17 (×2): 10 mg via INTRAVENOUS

## 2019-12-17 MED ORDER — PROPOFOL 500 MG/50ML IV EMUL
INTRAVENOUS | Status: DC | PRN
  Administered 2019-12-17: 150 ug/kg/min via INTRAVENOUS

## 2019-12-17 NOTE — Op Note (Addendum)
[facility] Gastroenterology Patient Name: Theresa Gross Procedure Date: 12/17/2019 9:08 AM MRN: [id number] Account #: [id number] Date of Birth: 21-Apr-1956 Admit Type: Outpatient Age: 64 Room: [facility] ENDO ROOM 3 Gender: Female Note Status: Supervisor Override Procedure:             Colonoscopy Indications:           Screening in patient at increased risk: Family history                         of 1st-degree relative with colorectal cancer,                         Personal history of ulcerative colitis Providers:             Lorie Apley K. Alice Reichert MD, MD Referring MD:          Juline Patch, MD (Referring MD) Medicines:             Propofol per Anesthesia Complications:         No immediate complications. Procedure:             Pre-Anesthesia Assessment:                        - The risks and benefits of the procedure and the                         sedation options and risks were discussed with the                         patient. All questions were answered and informed                         consent was obtained.                        - Patient identification and proposed procedure were                         verified prior to the procedure by the nurse. The                         procedure was verified in the procedure room.                        - ASA Grade Assessment: III - A patient with severe                         systemic disease.                        - After reviewing the risks and benefits, the patient                         was deemed in satisfactory condition to undergo the                         procedure.                        After obtaining informed  consent, the colonoscope was                         passed under direct vision. Throughout the procedure,                         the patient's blood pressure, pulse, and oxygen                         saturations were monitored continuously. The                         Colonoscope was  introduced through the anus and                         advanced to the the cecum, identified by appendiceal                         orifice and ileocecal valve. The colonoscopy was                         performed without difficulty. The patient tolerated                         the procedure well. The quality of the bowel                         preparation was good. The ileocecal valve, appendiceal                         orifice, and rectum were photographed. Findings:      The perianal and digital rectal examinations were normal. Pertinent       negatives include normal sphincter tone and no palpable rectal lesions.      Multiple small and large-mouthed diverticula were found in the sigmoid       colon. There was no evidence of diverticular bleeding.      Many small-mouthed diverticula were found in the right colon. There was       no evidence of diverticular bleeding.      Non-bleeding internal hemorrhoids were found during retroflexion. The       hemorrhoids were Grade I (internal hemorrhoids that do not prolapse).      The exam was otherwise without abnormality. Impression:            - Moderate diverticulosis in the sigmoid colon. There                         was no evidence of diverticular bleeding.                        - Mild diverticulosis in the right colon. There was no                         evidence of diverticular bleeding.                        - Non-bleeding internal hemorrhoids.                        -  The examination was otherwise normal.                        - No specimens collected. Recommendation:        - Await pathology results from EGD, also performed                         today.                        - Patient has a contact number available for                         emergencies. The signs and symptoms of potential                         delayed complications were discussed with the patient.                         Return to normal activities  tomorrow. Written                         discharge instructions were provided to the patient.                        - Resume previous diet.                        - Continue present medications.                        - Repeat colonoscopy in 5 years for screening purposes.                        - Return to GI office in 1 year.                        - Please follow up with Effie Berkshire, PA-C in 6                         weeks                        - The findings and recommendations were discussed with                         the patient. Procedure Code(s):     --- Professional ---                        G2952, Colorectal cancer screening; colonoscopy on                         individual at high risk Diagnosis Code(s):     --- Professional ---                        K57.30, Diverticulosis of large intestine without                         perforation or abscess without bleeding  K64.0, First degree hemorrhoids                        Z80.0, Family history of malignant neoplasm of                         digestive organs CPT copyright 2019 American Medical Association. All rights reserved. The codes documented in this report are preliminary and upon coder review may  be revised to meet current compliance requirements. Efrain Sella MD, MD 12/17/2019 9:40:00 AM This report has been signed electronically. Number of Addenda: 0 Note Initiated On: 12/17/2019 9:08 AM Scope Withdrawal Time: 0 hours 6 minutes 5 seconds  Total Procedure Duration: 0 hours 9 minutes 20 seconds  Estimated Blood Loss:  Estimated blood loss: none.      [facility]

## 2019-12-17 NOTE — Anesthesia Preprocedure Evaluation (Signed)
Anesthesia Evaluation  Patient identified by MRN, date of birth, ID band Patient awake    Reviewed: Allergy & Precautions, NPO status , Patient's Chart, lab work & pertinent test results  History of Anesthesia Complications Negative for: history of anesthetic complications  Airway Mallampati: II       Dental   Pulmonary neg sleep apnea, neg COPD, Not current smoker, former smoker,           Cardiovascular hypertension, Pt. on medications (-) Past MI and (-) CHF + dysrhythmias (hx of bradycardia after a procedure, evaluated by Dr. Ubaldo Glassing, no problems, no meds) (-) Valvular Problems/Murmurs     Neuro/Psych neg Seizures    GI/Hepatic Neg liver ROS, GERD  Medicated and Controlled,  Endo/Other  neg diabetes  Renal/GU negative Renal ROS     Musculoskeletal   Abdominal   Peds  Hematology   Anesthesia Other Findings   Reproductive/Obstetrics                             Anesthesia Physical Anesthesia Plan  ASA: II  Anesthesia Plan: General   Post-op Pain Management:    Induction: Intravenous  PONV Risk Score and Plan: 3 and Propofol infusion, TIVA and Treatment may vary due to age or medical condition  Airway Management Planned: Nasal Cannula  Additional Equipment:   Intra-op Plan:   Post-operative Plan:   Informed Consent: I have reviewed the patients History and Physical, chart, labs and discussed the procedure including the risks, benefits and alternatives for the proposed anesthesia with the patient or authorized representative who has indicated his/her understanding and acceptance.       Plan Discussed with:   Anesthesia Plan Comments:         Anesthesia Quick Evaluation

## 2019-12-17 NOTE — Interval H&P Note (Signed)
History and Physical Interval Note:  12/17/2019 9:13 AM  Theresa Gross  has presented today for surgery, with the diagnosis of GERD,FAMILY HX.OF COLON CANCER.  The various methods of treatment have been discussed with the patient and family. After consideration of risks, benefits and other options for treatment, the patient has consented to  Procedure(s): ESOPHAGOGASTRODUODENOSCOPY (EGD) WITH PROPOFOL (N/A) COLONOSCOPY WITH PROPOFOL (N/A) as a surgical intervention.  The patient's history has been reviewed, patient examined, no change in status, stable for surgery.  I have reviewed the patient's chart and labs.  Questions were answered to the patient's satisfaction.     [place], [place]

## 2019-12-17 NOTE — Op Note (Signed)
[facility] Gastroenterology Patient Name: Theresa Gross Procedure Date: 12/17/2019 9:09 AM MRN: [id number] Account #: [id number] Date of Birth: [birth year] Admit Type: Outpatient Age: 64 Room: [facility] ENDO ROOM 3 Gender: Female Note Status: Finalized Procedure:             Upper GI endoscopy Indications:           Gastro-esophageal reflux disease Providers:             Benay Pike. Alice Reichert MD, MD Referring MD:          Juline Patch, MD (Referring MD) Medicines:             Propofol per Anesthesia Complications:         No immediate complications. Procedure:             Pre-Anesthesia Assessment:                        - The risks and benefits of the procedure and the                         sedation options and risks were discussed with the                         patient. All questions were answered and informed                         consent was obtained.                        - Patient identification and proposed procedure were                         verified prior to the procedure by the nurse. The                         procedure was verified in the procedure room.                        - ASA Grade Assessment: III - A patient with severe                         systemic disease.                        - After reviewing the risks and benefits, the patient                         was deemed in satisfactory condition to undergo the                         procedure.                        After obtaining informed consent, the endoscope was                         passed under direct vision. Throughout the procedure,                         the patient's blood pressure,  pulse, and oxygen                         saturations were monitored continuously. The Endoscope                         was introduced through the mouth, and advanced to the                         third part of duodenum. The upper GI endoscopy was                         accomplished without  difficulty. The patient tolerated                         the procedure well. Findings:      The examined esophagus was normal.      A 2 cm hiatal hernia was present.      A single 7 mm sessile polyp with no bleeding and no stigmata of recent       bleeding was found in the gastric body. The polyp was removed with a       jumbo cold forceps. Resection and retrieval were complete.      The examined duodenum was normal.      The exam was otherwise without abnormality. Impression:            - Normal esophagus.                        - 2 cm hiatal hernia.                        - A single gastric polyp. Resected and retrieved.                        - Normal examined duodenum.                        - The examination was otherwise normal. Recommendation:        - Await pathology results.                        - Proceed with colonoscopy Procedure Code(s):     --- Professional ---                        [phone], Esophagogastroduodenoscopy, flexible,                         transoral; with biopsy, single or multiple Diagnosis Code(s):     --- Professional ---                        K21.9, Gastro-esophageal reflux disease without                         esophagitis                        K31.7, Polyp of stomach and duodenum  K44.9, Diaphragmatic hernia without obstruction or                         gangrene CPT copyright 2019 American Medical Association. All rights reserved. The codes documented in this report are preliminary and upon coder review may  be revised to meet current compliance requirements. Efrain Sella MD, MD 12/17/2019 9:26:21 AM This report has been signed electronically. Number of Addenda: 0 Note Initiated On: 12/17/2019 9:09 AM Estimated Blood Loss:  Estimated blood loss: none.      [facility]

## 2019-12-17 NOTE — Transfer of Care (Signed)
Immediate Anesthesia Transfer of Care Note  Patient: Theresa Gross  Procedure(s) Performed: ESOPHAGOGASTRODUODENOSCOPY (EGD) WITH PROPOFOL (N/A ) COLONOSCOPY WITH PROPOFOL (N/A )  Patient Location: PACU and Endoscopy Unit  Anesthesia Type:General  Level of Consciousness: awake, alert  and oriented  Airway & Oxygen Therapy: Patient Spontanous Breathing and Patient connected to nasal cannula oxygen  Post-op Assessment: Report given to RN and Post -op Vital signs reviewed and stable  Post vital signs: Reviewed and stable  Last Vitals:  Vitals Value Taken Time  BP    Temp    Pulse    Resp    SpO2      Last Pain:  Vitals:   12/17/19 0745  TempSrc: Temporal  PainSc: 0-No pain         Complications: No complications documented.

## 2019-12-17 NOTE — Anesthesia Postprocedure Evaluation (Signed)
Anesthesia Post Note  Patient: Theresa Gross  Procedure(s) Performed: ESOPHAGOGASTRODUODENOSCOPY (EGD) WITH PROPOFOL (N/A ) COLONOSCOPY WITH PROPOFOL (N/A )  Patient location during evaluation: Endoscopy Anesthesia Type: General Level of consciousness: awake and alert Pain management: pain level controlled Vital Signs Assessment: post-procedure vital signs reviewed and stable Respiratory status: spontaneous breathing and respiratory function stable Cardiovascular status: stable Anesthetic complications: no   No complications documented.   Last Vitals:  Vitals:   12/17/19 0941 12/17/19 0951  BP: (!) 88/65 (!) 87/55  Pulse: 88 71  Resp: 15 19  Temp: (!) 36.1 C   SpO2: 97% 100%    Last Pain:  Vitals:   12/17/19 0951  TempSrc:   PainSc: 0-No pain                 Hadden Steig K

## 2019-12-17 NOTE — H&P (Signed)
Outpatient short stay form Pre-procedure 12/17/2019 9:12 AM Theresa Gross Theresa Gross, M.D.  Primary Physician: Otilio Miu, M.D.  Reason for visit:  GERD, Family history of colon cancer  History of present illness:  Patient with well controlled GERD presents for screening for Barrett's esophagus. Patient denies intractable heartburn, dysphagia, hemetemesis, abdominal pain, nausea or vomiting.  64 year old patient presenting for family history of colon cancer. Patient denies any change in bowel habits, rectal bleeding or involuntary weight loss.     Current Facility-Administered Medications:  .  0.9 %  sodium chloride infusion, , Intravenous, Continuous, [place], Benay Pike, MD, Last Rate: 20 mL/hr at 12/17/19 4496, Continued from Pre-op at 12/17/19 0912  Medications Prior to Admission  Medication Sig Dispense Refill Last Dose  . aspirin EC 81 MG tablet Take 81 mg by mouth daily. Swallow whole.   Past Week at Unknown time  . carbamide peroxide (DEBROX) 6.5 % OTIC solution Place 5 drops into the right ear 2 (two) times daily. 15 mL 0 12/16/2019 at Unknown time  . Cholecalciferol (VITAMIN D) 2000 units tablet Take 2,000 Units by mouth daily.   12/16/2019 at Unknown time  . co-enzyme Q-10 30 MG capsule Take 30 mg by mouth 3 (three) times daily.   12/16/2019 at Unknown time  . DELZICOL 400 MG CPDR DR capsule Take 2 tablets by mouth 2 (two) times daily. GI Dr  3 12/16/2019 at Unknown time  . lansoprazole (PREVACID) 30 MG capsule Take 1 capsule by mouth daily. GI Doc   12/17/2019 at Unknown time  . lisinopril-hydrochlorothiazide (ZESTORETIC) 10-12.5 MG tablet Take 1 tablet by mouth daily. 14 tablet 0 12/17/2019 at Unknown time  . rosuvastatin (CRESTOR) 20 MG tablet Take 1 tablet (20 mg total) by mouth daily. 14 tablet 0 12/16/2019 at Unknown time  . Apremilast 30 MG TABS Take 1 tablet by mouth 2 (two) times daily. Derm     . meclizine (ANTIVERT) 25 MG tablet Take 1 tablet (25 mg total) by mouth 3 (three) times  daily as needed for dizziness. 90 tablet 0   . nystatin-triamcinolone (MYCOLOG II) cream Apply 1 application topically 2 (two) times daily. PRN/ Derm 30 g 1      Allergies  Allergen Reactions  . Ciprofloxacin Nausea And Vomiting  . Lipitor [Atorvastatin] Other (See Comments)    Body Aches  . Metronidazole Nausea And Vomiting  . Nsaids Other (See Comments)    Have to be careful due to liver     Past Medical History:  Diagnosis Date  . Cervical cancer [facility])    age 53  . Diverticulosis   . GERD (gastroesophageal reflux disease)   . Hemorrhoids   . History of colon polyps   . Hyperlipemia   . Hypertension   . Left sided colitis ([place])   . Vitamin D deficiency     Review of systems:  Otherwise negative.    Physical Exam  Gen: Alert, oriented. Appears stated age.  HEENT: [place]/AT. PERRLA. Lungs: CTA, no wheezes. CV: RR nl S1, S2. Abd: soft, benign, no masses. BS+ Ext: No edema. Pulses 2+    Planned procedures: Proceed with EGD and colonoscopy. The patient understands the nature of the planned procedure, indications, risks, alternatives and potential complications including but not limited to bleeding, infection, perforation, damage to internal organs and possible oversedation/side effects from anesthesia. The patient agrees and gives consent to proceed.  Please refer to procedure notes for findings, recommendations and patient disposition/instructions.  Densel Kronick Theresa Gross, M.D. Gastroenterology 12/17/2019  9:12 AM

## 2019-12-18 ENCOUNTER — Encounter: Payer: Self-pay | Admitting: Internal Medicine

## 2019-12-18 LAB — SURGICAL PATHOLOGY

## 2020-01-01 DIAGNOSIS — K449 Diaphragmatic hernia without obstruction or gangrene: Secondary | ICD-10-CM | POA: Diagnosis not present

## 2020-01-01 DIAGNOSIS — K5150 Left sided colitis without complications: Secondary | ICD-10-CM | POA: Diagnosis not present

## 2020-01-01 DIAGNOSIS — K219 Gastro-esophageal reflux disease without esophagitis: Secondary | ICD-10-CM | POA: Diagnosis not present

## 2020-01-01 DIAGNOSIS — M5416 Radiculopathy, lumbar region: Secondary | ICD-10-CM | POA: Diagnosis not present

## 2020-01-01 DIAGNOSIS — M9903 Segmental and somatic dysfunction of lumbar region: Secondary | ICD-10-CM | POA: Diagnosis not present

## 2020-01-01 DIAGNOSIS — M5136 Other intervertebral disc degeneration, lumbar region: Secondary | ICD-10-CM | POA: Diagnosis not present

## 2020-01-01 DIAGNOSIS — M9905 Segmental and somatic dysfunction of pelvic region: Secondary | ICD-10-CM | POA: Diagnosis not present

## 2020-01-04 ENCOUNTER — Other Ambulatory Visit: Payer: Self-pay | Admitting: Family Medicine

## 2020-01-04 DIAGNOSIS — E7800 Pure hypercholesterolemia, unspecified: Secondary | ICD-10-CM

## 2020-01-04 DIAGNOSIS — I10 Essential (primary) hypertension: Secondary | ICD-10-CM

## 2020-01-04 MED ORDER — LISINOPRIL-HYDROCHLOROTHIAZIDE 10-12.5 MG PO TABS: 1.0000 | ORAL_TABLET | Freq: Every day | ORAL | 1 refills | Status: DC

## 2020-01-04 MED ORDER — ROSUVASTATIN CALCIUM 20 MG PO TABS: 20.0000 mg | ORAL_TABLET | Freq: Every day | ORAL | 1 refills | Status: DC

## 2020-01-04 NOTE — Telephone Encounter (Signed)
Medication Refill - Medication: Rosuvaftatin 20mg , Zestoretic 10-12.5mg    Has the patient contacted their pharmacy? Yes.   RX has to be sent in by PCP (Agent: If no, request that the patient contact the pharmacy for the refill.) (Agent: If yes, when and what did the pharmacy advise?)  Preferred Pharmacy (with phone number or street name): [facility]  Agent: Please be advised that RX refills may take up to 3 business days. We ask that you follow-up with your pharmacy.

## 2020-01-07 ENCOUNTER — Other Ambulatory Visit: Payer: Self-pay

## 2020-01-07 DIAGNOSIS — L400 Psoriasis vulgaris: Secondary | ICD-10-CM

## 2020-01-07 MED ORDER — APREMILAST 30 MG PO TABS: 1.0000 | ORAL_TABLET | Freq: Two times a day (BID) | ORAL | 0 refills | Status: DC

## 2020-01-09 DIAGNOSIS — M722 Plantar fascial fibromatosis: Secondary | ICD-10-CM | POA: Diagnosis not present

## 2020-01-09 DIAGNOSIS — M79671 Pain in right foot: Secondary | ICD-10-CM | POA: Diagnosis not present

## 2020-01-09 DIAGNOSIS — M7731 Calcaneal spur, right foot: Secondary | ICD-10-CM | POA: Diagnosis not present

## 2020-01-23 DIAGNOSIS — M9905 Segmental and somatic dysfunction of pelvic region: Secondary | ICD-10-CM | POA: Diagnosis not present

## 2020-01-23 DIAGNOSIS — M5136 Other intervertebral disc degeneration, lumbar region: Secondary | ICD-10-CM | POA: Diagnosis not present

## 2020-01-23 DIAGNOSIS — M9903 Segmental and somatic dysfunction of lumbar region: Secondary | ICD-10-CM | POA: Diagnosis not present

## 2020-01-23 DIAGNOSIS — M5416 Radiculopathy, lumbar region: Secondary | ICD-10-CM | POA: Diagnosis not present

## 2020-02-04 DIAGNOSIS — M722 Plantar fascial fibromatosis: Secondary | ICD-10-CM | POA: Diagnosis not present

## 2020-02-04 DIAGNOSIS — M7731 Calcaneal spur, right foot: Secondary | ICD-10-CM | POA: Diagnosis not present

## 2020-02-18 ENCOUNTER — Ambulatory Visit (INDEPENDENT_AMBULATORY_CARE_PROVIDER_SITE_OTHER): Payer: Medicare Other | Admitting: Dermatology

## 2020-02-18 ENCOUNTER — Other Ambulatory Visit: Payer: Self-pay

## 2020-02-18 DIAGNOSIS — L409 Psoriasis, unspecified: Secondary | ICD-10-CM

## 2020-02-18 DIAGNOSIS — Z79899 Other long term (current) drug therapy: Secondary | ICD-10-CM | POA: Diagnosis not present

## 2020-02-18 DIAGNOSIS — L814 Other melanin hyperpigmentation: Secondary | ICD-10-CM

## 2020-02-18 DIAGNOSIS — Z86018 Personal history of other benign neoplasm: Secondary | ICD-10-CM

## 2020-02-18 DIAGNOSIS — B079 Viral wart, unspecified: Secondary | ICD-10-CM

## 2020-02-18 DIAGNOSIS — L578 Other skin changes due to chronic exposure to nonionizing radiation: Secondary | ICD-10-CM | POA: Diagnosis not present

## 2020-02-18 DIAGNOSIS — D229 Melanocytic nevi, unspecified: Secondary | ICD-10-CM

## 2020-02-18 MED ORDER — OTEZLA 30 MG PO TABS: 30.0000 mg | ORAL_TABLET | Freq: Two times a day (BID) | ORAL | 1 refills | Status: DC

## 2020-02-18 NOTE — Patient Instructions (Signed)
Viral Warts & Molluscum Contagiosum  Viral warts and molluscum contagiosum are growths of the skin caused by viral infection of the skin. If you have been given the diagnosis of viral warts or molluscum contagiosum there are a few things that you must understand about your condition:  1. There is no guaranteed treatment method available for this condition. 2. Multiple treatments may be required, 3. The treatments may be time consuming and require multiple visits to the dermatology office. 4. The treatment may be expensive. You will be charged each time you come into the office to have the spots treated. 5. The treated areas may develop new lesions further complicating treatment. 6. The treated areas may leave a scar. 7. There is no guarantee that even after multiple treatments that the spots will be successfully treated. 8. These are caused by a viral infection and can be spread to other areas of the skin and to other people by direct contact. Therefore, new spots may occur. Cryotherapy Aftercare  . Wash gently with soap and water everyday.   Marland Kitchen Apply Vaseline and Band-Aid daily until healed.

## 2020-02-18 NOTE — Progress Notes (Signed)
Follow-Up Visit   Subjective  Theresa Gross is a 64 y.o. female who presents for the following: Psoriasis (elbows, legs, scalp, Otezla 30mg  1 po bid, no side effects from otezla) and growths (L middle finger, R chest, both sore).  She has been taking [place] for several years and it is working well. She has h/o dysplastic nevus on her back.   The following portions of the chart were reviewed this encounter and updated as appropriate:      Review of Systems:  No other skin or systemic complaints except as noted in HPI or Assessment and Plan.  Objective  Well appearing patient in no apparent distress; mood and affect are within normal limits.  A focused examination was performed including arms, legs, scalp, chest, back, hands. Relevant physical exam findings are noted in the Assessment and Plan.  Objective  elbows, scalp: Mild erythema and scale elbows, mild erythema occipital scalp, legs clear    Objective  L 3rd finger x 1, L 4th finger tip x 1, R chest x 1 (3): Verrucous papules -- Discussed viral etiology and contagion.  L 4th finger tip 3.50mm L 3rd finger 4.61mm  Objective  Right Upper Back: Scar with no evidence of recurrence.    Assessment & Plan    Actinic Damage - diffuse scaly erythematous macules with underlying dyspigmentation - Recommend daily broad spectrum sunscreen SPF 30+ to sun-exposed areas, reapply every 2 hours as needed.  - Call for new or changing lesions.  Lentigines - Scattered tan macules - Discussed due to sun exposure - Benign, observe - Call for any changes  Melanocytic Nevi - Tan-brown and/or pink-flesh-colored symmetric macules and papules - Benign appearing on exam today - Observation - Call clinic for new or changing moles - Recommend daily use of broad spectrum spf 30+ sunscreen to sun-exposed areas.   Long term medication management. Psoriasis elbows, scalp  Well-Controlled on Otezla, tolerating well  Cont Otezla 30mg  1  po bid, or may decrease to 1 po qd to see if control is maintained on lower dose Cont Taclonex sol prn flares Cont HC 2.5% cr qd/bid aa face prn flares  Side effects of Otezla (apremilast) include diarrhea, nausea, headache, upper respiratory infection, depression, and weight decrease (5-10%). It should only be taken by pregnant women after a discussion regarding risks and benefits with their doctor.   Apremilast (OTEZLA) 30 MG TABS - elbows, scalp  Viral warts, unspecified type (3) L 3rd finger x 1, L 4th finger tip x 1, R chest x 1  L 3rd finger, and L 4th finger tip  Wart vs ISK (R chest)  Discussed viral etiology and risk of spread.  Discussed multiple treatments may be required to clear warts.  Discussed possible post-treatment dyspigmentation and risk of recurrence.   Destruction of lesion - L 3rd finger x 1, L 4th finger tip x 1, R chest x 1  Destruction method: cryotherapy   Destruction method comment:  Electrodessication Informed consent: discussed and consent obtained   Lesion destroyed using liquid nitrogen: Yes   Region frozen until ice ball extended beyond lesion: Yes   Outcome: patient tolerated procedure well with no complications   Post-procedure details: wound care instructions given   Additional details:  Prior to procedure, discussed risks of blister formation, small wound, skin dyspigmentation, or rare scar following cryotherapy.  History of dysplastic nevus Right Upper Back  Clear. Observe for recurrence. Call clinic for new or changing lesions.  Recommend regular skin  exams, daily broad-spectrum spf 30+ sunscreen use, and photoprotection.     Return in about 1 month (around 03/20/2020) for warts.  I, Othelia Pulling, RMA, am acting as scribe for [name], MD . Documentation: I have reviewed the above documentation for accuracy and completeness, and I agree with the above.  [name] MD

## 2020-02-20 DIAGNOSIS — M9903 Segmental and somatic dysfunction of lumbar region: Secondary | ICD-10-CM | POA: Diagnosis not present

## 2020-02-20 DIAGNOSIS — M5416 Radiculopathy, lumbar region: Secondary | ICD-10-CM | POA: Diagnosis not present

## 2020-02-20 DIAGNOSIS — M9905 Segmental and somatic dysfunction of pelvic region: Secondary | ICD-10-CM | POA: Diagnosis not present

## 2020-02-20 DIAGNOSIS — M5136 Other intervertebral disc degeneration, lumbar region: Secondary | ICD-10-CM | POA: Diagnosis not present

## 2020-03-12 ENCOUNTER — Ambulatory Visit (INDEPENDENT_AMBULATORY_CARE_PROVIDER_SITE_OTHER): Payer: Medicare Other | Admitting: Dermatology

## 2020-03-12 ENCOUNTER — Other Ambulatory Visit: Payer: Self-pay

## 2020-03-12 ENCOUNTER — Encounter: Payer: Self-pay | Admitting: Dermatology

## 2020-03-12 DIAGNOSIS — B078 Other viral warts: Secondary | ICD-10-CM | POA: Diagnosis not present

## 2020-03-12 NOTE — Patient Instructions (Addendum)
Cryotherapy Aftercare  . Wash gently with soap and water everyday.   Marland Kitchen Apply Vaseline and Band-Aid daily until healed.  Prior to procedure, discussed risks of blister formation, small wound, skin dyspigmentation, or rare scar following cryotherapy.   Cantharidin is a blistering agent that comes from a beetle.  It needs to be washed off in about 4-6 hours after application.  Although it is painless when applied in office, it may cause symptoms of mild pain and burning several hours later.  Treated areas will swell and turn red, and blisters may form.  Vaseline and a bandaid may be applied until wound has healed.  Once healed, the skin may remain temporarily discolored.  It can take weeks to months for pigmentation to return to normal.  The molluscum may resolve with this topical treatment, but often, additional treatments may be required to clear molluscum.  It is recommended to keep the skin well-moisturized and avoid scratching affected area to help prevent spread of the molluscum.  Viral Warts & Molluscum Contagiosum  Viral warts and molluscum contagiosum are growths of the skin caused by viral infection of the skin. If you have been given the diagnosis of viral warts or molluscum contagiosum there are a few things that you must understand about your condition:  1. There is no guaranteed treatment method available for this condition. 2. Multiple treatments may be required, 3. The treatments may be time consuming and require multiple visits to the dermatology office. 4. The treatment may be expensive. You will be charged each time you come into the office to have the spots treated. 5. The treated areas may develop new lesions further complicating treatment. 6. The treated areas may leave a scar. 7. There is no guarantee that even after multiple treatments that the spots will be successfully treated. 8. These are caused by a viral infection and can be spread to other areas of the skin and to  other people by direct contact. Therefore, new spots may occur.

## 2020-03-12 NOTE — Progress Notes (Signed)
° °  Follow-Up Visit   Subjective  Theresa Gross is a 64 y.o. female who presents for the following: Warts (1 month follow-up. Warts at L-3,4 fingers. Tx with LN2. Improved but not resolved. ).    The following portions of the chart were reviewed this encounter and updated as appropriate:     Review of Systems: No other skin or systemic complaints except as noted in HPI or Assessment and Plan.  Objective  Well appearing patient in no apparent distress; mood and affect are within normal limits.  A focused examination was performed including upper extremities, including the arms, hands, fingers, and fingernails and face. Relevant physical exam findings are noted in the Assessment and Plan.  Objective  Left 4th Finger palmar tip x1, left 3rd finger medial x1 (2): Verrucous papules -- Discussed viral etiology and contagion.   L-4 finger palmar tip 76mm with flattening. L-3 medial finger 85mm  Assessment & Plan  Other viral warts (2) Left 4th Finger palmar tip x1, left 3rd finger medial x1  Cantharone plus also applied to both lesions following cryotherapy. Remove in 6 hours  Discussed viral etiology and risk of spread.  Discussed multiple treatments may be required to clear warts.  Discussed possible post-treatment dyspigmentation and risk of recurrence.     Destruction of lesion - Left 4th Finger palmar tip x1, left 3rd finger medial x1  Destruction method: cryotherapy   Informed consent: discussed and consent obtained   Timeout:  patient name, date of birth, surgical site, and procedure verified Lesion destroyed using liquid nitrogen: Yes   Region frozen until ice ball extended beyond lesion: Yes   Outcome: patient tolerated procedure well with no complications   Post-procedure details: wound care instructions given   Additional details:  Prior to procedure, discussed risks of blister formation, small wound, skin dyspigmentation, or rare scar following cryotherapy.  Return  in about 4 weeks (around 04/09/2020) for wart follow-up.   I, Emelia Salisbury, CMA, am acting as scribe for [name], MD.  Documentation: I have reviewed the above documentation for accuracy and completeness, and I agree with the above.  [name] MD

## 2020-03-19 ENCOUNTER — Ambulatory Visit: Admitting: Dermatology

## 2020-03-19 DIAGNOSIS — M9903 Segmental and somatic dysfunction of lumbar region: Secondary | ICD-10-CM | POA: Diagnosis not present

## 2020-03-19 DIAGNOSIS — M5416 Radiculopathy, lumbar region: Secondary | ICD-10-CM | POA: Diagnosis not present

## 2020-03-19 DIAGNOSIS — M5136 Other intervertebral disc degeneration, lumbar region: Secondary | ICD-10-CM | POA: Diagnosis not present

## 2020-03-19 DIAGNOSIS — M9905 Segmental and somatic dysfunction of pelvic region: Secondary | ICD-10-CM | POA: Diagnosis not present

## 2020-04-09 ENCOUNTER — Other Ambulatory Visit: Payer: Self-pay

## 2020-04-09 ENCOUNTER — Ambulatory Visit (INDEPENDENT_AMBULATORY_CARE_PROVIDER_SITE_OTHER): Payer: Medicare Other | Admitting: Dermatology

## 2020-04-09 DIAGNOSIS — B078 Other viral warts: Secondary | ICD-10-CM | POA: Diagnosis not present

## 2020-04-09 NOTE — Patient Instructions (Addendum)
Cryotherapy Aftercare  . Wash gently with soap and water everyday.   Marland Kitchen Apply Vaseline and Band-Aid daily until healed.  Prior to procedure, discussed risks of blister formation, small wound, skin dyspigmentation, or rare scar following cryotherapy.    Viral Warts & Molluscum Contagiosum  Viral warts and molluscum contagiosum are growths of the skin caused by viral infection of the skin. If you have been given the diagnosis of viral warts or molluscum contagiosum there are a few things that you must understand about your condition:  1. There is no guaranteed treatment method available for this condition. 2. Multiple treatments may be required, 3. The treatments may be time consuming and require multiple visits to the dermatology office. 4. The treatment may be expensive. You will be charged each time you come into the office to have the spots treated. 5. The treated areas may develop new lesions further complicating treatment. 6. The treated areas may leave a scar. 7. There is no guarantee that even after multiple treatments that the spots will be successfully treated. 8. These are caused by a viral infection and can be spread to other areas of the skin and to other people by direct contact. Therefore, new spots may occur.  Instructions for After In-Office Application of Cantharidin  1. This is a strong medicine; please follow ALL instructions.  2. Gently wash off with soap and water in four hours or sooner s directed by your physician.  3. **WARNING** this medicine can cause severe blistering, blood blisters, infection, and/or scarring if it is not washed off as directed.  4. Your progress will be rechecked in 1-2 months; call sooner if there are any questions or problems.

## 2020-04-09 NOTE — Progress Notes (Signed)
   Follow-Up Visit   Subjective  Theresa Gross is a 64 y.o. female who presents for the following: Warts (4 week recheck. Tx with LN2 and Cantharone 03/12/2020. Has been using Compound W at home. Appeared to be improved but grew back quickly. ).    The following portions of the chart were reviewed this encounter and updated as appropriate:     Review of Systems: No other skin or systemic complaints except as noted in HPI or Assessment and Plan.  Objective  Well appearing patient in no apparent distress; mood and affect are within normal limits.  A focused examination was performed including face, hands, fingers. Relevant physical exam findings are noted in the Assessment and Plan.  Objective  Left 4th palmar Finger Tipx1, Left medial Mid 3rd Finger x1: Verrucous papules -- Discussed viral etiology and contagion.   Assessment & Plan  Other viral warts (2) Left medial Mid 3rd Finger x1; Left 4th palmar Finger Tipx1  Destruction of lesion - Left 4th palmar Finger Tipx1, Left medial Mid 3rd Finger x1  Destruction method: cryotherapy   Informed consent: discussed and consent obtained   Timeout:  patient name, date of birth, surgical site, and procedure verified Outcome: patient tolerated procedure well with no complications   Post-procedure details: wound care instructions given   Additional details:  Cantharone plus also applied to L-4 finger tip.  Return in about 2 weeks (around 04/23/2020) for wart recheck.   I, Emelia Salisbury, CMA, am acting as scribe for [name], MD.  Documentation: I have reviewed the above documentation for accuracy and completeness, and I agree with the above.  [name] MD

## 2020-04-16 DIAGNOSIS — M9903 Segmental and somatic dysfunction of lumbar region: Secondary | ICD-10-CM | POA: Diagnosis not present

## 2020-04-16 DIAGNOSIS — M5136 Other intervertebral disc degeneration, lumbar region: Secondary | ICD-10-CM | POA: Diagnosis not present

## 2020-04-16 DIAGNOSIS — M5416 Radiculopathy, lumbar region: Secondary | ICD-10-CM | POA: Diagnosis not present

## 2020-04-16 DIAGNOSIS — M9905 Segmental and somatic dysfunction of pelvic region: Secondary | ICD-10-CM | POA: Diagnosis not present

## 2020-04-23 ENCOUNTER — Other Ambulatory Visit: Payer: Self-pay

## 2020-04-23 ENCOUNTER — Ambulatory Visit (INDEPENDENT_AMBULATORY_CARE_PROVIDER_SITE_OTHER): Payer: Medicare Other | Admitting: Dermatology

## 2020-04-23 ENCOUNTER — Encounter: Payer: Self-pay | Admitting: Dermatology

## 2020-04-23 DIAGNOSIS — B078 Other viral warts: Secondary | ICD-10-CM | POA: Diagnosis not present

## 2020-04-23 NOTE — Progress Notes (Addendum)
   Follow-Up Visit   Subjective  Theresa Gross is a 64 y.o. female who presents for the following: Warts (2 week recheck. L-3 finger and L-4 fingertip. Hx of OTC Tx, LN2, Cantharone Tx. Patient thinks areas may be resolved. ).    The following portions of the chart were reviewed this encounter and updated as appropriate:     Review of Systems: No other skin or systemic complaints except as noted in HPI or Assessment and Plan.  Objective  Well appearing patient in no apparent distress; mood and affect are within normal limits.  A focused examination was performed including face, hands, fingers. Relevant physical exam findings are noted in the Assessment and Plan.  Objective  Left 4th Finger Tip x1, Right 3rd lateral finger x1 (2): Verrucous papules -- Discussed viral etiology and contagion.   Tiny residual at left lateral 3rd finger. 2.81mm flat verrucous papule at left 4th palmar finger tip.  Assessment & Plan  Other viral warts (2) Left 4th Finger Tip x1, Right 3rd lateral finger x1  Discussed viral etiology and risk of spread.  Discussed multiple treatments may be required to clear warts.  Discussed possible post-treatment dyspigmentation and risk of recurrence.   Destruction of lesion - Left 4th Finger Tip x1, Right 3rd lateral finger x1  Destruction method: cryotherapy   Informed consent: discussed and consent obtained   Lesion destroyed using liquid nitrogen: Yes   Region frozen until ice ball extended beyond lesion: Yes   Outcome: patient tolerated procedure well with no complications   Post-procedure details: wound care instructions given   Additional details:  Cantharone plus also applied to L-4 finger tip.  Return in about 2 weeks (around 05/07/2020) for wart recheck.   I, Emelia Salisbury, CMA, am acting as scribe for [name], MD.  Documentation: I have reviewed the above documentation for accuracy and completeness, and I agree with the above.  [name]  MD

## 2020-04-23 NOTE — Patient Instructions (Addendum)
Cryotherapy Aftercare  . Wash gently with soap and water everyday.   Marland Kitchen Apply Vaseline and Band-Aid daily until healed.  Prior to procedure, discussed risks of blister formation, small wound, skin dyspigmentation, or rare scar following cryotherapy.    Instructions for After In-Office Application of Cantharidin  1. This is a strong medicine; please follow ALL instructions.  2. Gently wash off with soap and water in four hours or sooner s directed by your physician.  3. **WARNING** this medicine can cause severe blistering, blood blisters, infection, and/or scarring if it is not washed off as directed.  4. Your progress will be rechecked in 1-2 months; call sooner if there are any questions or problems.  Viral Warts & Molluscum Contagiosum  Viral warts and molluscum contagiosum are growths of the skin caused by viral infection of the skin. If you have been given the diagnosis of viral warts or molluscum contagiosum there are a few things that you must understand about your condition:  1. There is no guaranteed treatment method available for this condition. 2. Multiple treatments may be required, 3. The treatments may be time consuming and require multiple visits to the dermatology office. 4. The treatment may be expensive. You will be charged each time you come into the office to have the spots treated. 5. The treated areas may develop new lesions further complicating treatment. 6. The treated areas may leave a scar. 7. There is no guarantee that even after multiple treatments that the spots will be successfully treated. 8. These are caused by a viral infection and can be spread to other areas of the skin and to other people by direct contact. Therefore, new spots may occur.

## 2020-04-30 ENCOUNTER — Ambulatory Visit (INDEPENDENT_AMBULATORY_CARE_PROVIDER_SITE_OTHER): Payer: Medicare Other | Admitting: Family Medicine

## 2020-04-30 ENCOUNTER — Encounter: Payer: Self-pay | Admitting: Family Medicine

## 2020-04-30 ENCOUNTER — Other Ambulatory Visit: Payer: Self-pay

## 2020-04-30 VITALS — BP 120/80 | HR 64 | Ht 66.0 in | Wt 224.0 lb

## 2020-04-30 DIAGNOSIS — E782 Mixed hyperlipidemia: Secondary | ICD-10-CM | POA: Diagnosis not present

## 2020-04-30 DIAGNOSIS — I10 Essential (primary) hypertension: Secondary | ICD-10-CM | POA: Diagnosis not present

## 2020-04-30 MED ORDER — LISINOPRIL-HYDROCHLOROTHIAZIDE 10-12.5 MG PO TABS: 1.0000 | ORAL_TABLET | Freq: Every day | ORAL | 1 refills | Status: DC

## 2020-04-30 NOTE — Progress Notes (Signed)
Date:  04/30/2020   Name:  Theresa Gross   DOB:  Apr 12, 1956   MRN:  [id number]   Chief Complaint: Hypertension  Hypertension This is a chronic problem. The current episode started more than 1 year ago. The problem has been gradually improving since onset. The problem is controlled. Pertinent negatives include no anxiety, blurred vision, chest pain, headaches, malaise/fatigue, neck pain, orthopnea, palpitations, peripheral edema, PND, shortness of breath or sweats. There are no associated agents to hypertension. Risk factors for coronary artery disease include dyslipidemia. Past treatments include ACE inhibitors and diuretics. The current treatment provides moderate improvement. There are no compliance problems.  There is no history of angina, kidney disease, CAD/MI, CVA, heart failure, left ventricular hypertrophy, PVD or retinopathy. There is no history of chronic renal disease, a hypertension causing med or renovascular disease.    Lab Results  Component Value Date   CREATININE 0.71 06/13/2019   BUN 10 06/13/2019   NA 140 06/13/2019   K 4.4 06/13/2019   CL 104 06/13/2019   CO2 23 06/13/2019   Lab Results  Component Value Date   CHOL 172 06/13/2019   HDL 60 06/13/2019   LDLCALC 87 06/13/2019   TRIG 143 06/13/2019   CHOLHDL 4.6 (H) 08/23/2017   No results found for: TSH No results found for: HGBA1C Lab Results  Component Value Date   WBC 6.9 05/17/2013   HGB 13.7 05/17/2013   HCT 39.4 05/17/2013   MCV 94 05/17/2013   PLT 241 05/17/2013   Lab Results  Component Value Date   ALT 28 06/13/2019   AST 25 06/13/2019   ALKPHOS 77 06/13/2019   BILITOT 0.5 06/13/2019     Review of Systems  Constitutional: Negative.  Negative for chills, fatigue, fever, malaise/fatigue and unexpected weight change.  HENT: Negative for congestion, ear discharge, ear pain, rhinorrhea, sinus pressure, sneezing and sore throat.   Eyes: Negative for blurred vision, photophobia, pain,  discharge, redness and itching.  Respiratory: Negative for cough, shortness of breath, wheezing and stridor.   Cardiovascular: Negative for chest pain, palpitations, orthopnea and PND.  Gastrointestinal: Negative for abdominal pain, blood in stool, constipation, diarrhea, nausea and vomiting.  Endocrine: Negative for cold intolerance, heat intolerance, polydipsia, polyphagia and polyuria.  Genitourinary: Negative for dysuria, flank pain, frequency, hematuria, menstrual problem, pelvic pain, urgency, vaginal bleeding and vaginal discharge.  Musculoskeletal: Negative for arthralgias, back pain, myalgias and neck pain.  Skin: Negative for rash.  Allergic/Immunologic: Negative for environmental allergies and food allergies.  Neurological: Negative for dizziness, weakness, light-headedness, numbness and headaches.  Hematological: Negative for adenopathy. Does not bruise/bleed easily.  Psychiatric/Behavioral: Negative for dysphoric mood. The patient is not nervous/anxious.     Patient Active Problem List   Diagnosis Date Noted  . Class 2 obesity due to excess calories without serious comorbidity with body mass index (BMI) of 37.0 to 37.9 in adult 02/16/2017  . Pure hypercholesterolemia 02/16/2017  . Aortic atherosclerosis ([place]) 05/25/2016  . Steatosis of liver 05/25/2016  . Mixed hyperlipidemia 05/25/2016  . Essential (primary) hypertension 10/28/2014  . Gastro-esophageal reflux disease without esophagitis 10/28/2014  . H/O psoriasis 10/28/2014  . Decreased potassium in the blood 10/28/2014  . Familial multiple lipoprotein-type hyperlipidemia 10/28/2014    Allergies  Allergen Reactions  . Ciprofloxacin Nausea And Vomiting  . Lipitor [Atorvastatin] Other (See Comments)    Body Aches  . Metronidazole Nausea And Vomiting  . Nsaids Other (See Comments)    Have to be careful due  to liver    Past Surgical History:  Procedure Laterality Date  . ABDOMINAL HYSTERECTOMY    . BREAST BIOPSY  Right 10 plus yrs ago   stereo bx./clip. Benign  . CHOLECYSTECTOMY    . COLONOSCOPY WITH PROPOFOL N/A 06/16/2016   Procedure: COLONOSCOPY WITH PROPOFOL;  Surgeon: Manya Silvas, MD;  Location: [facility] ENDOSCOPY;  Service: Endoscopy;  Laterality: N/A;  . COLONOSCOPY WITH PROPOFOL N/A 12/17/2019   Procedure: COLONOSCOPY WITH PROPOFOL;  Surgeon: Toledo, Benay Pike, MD;  Location: [facility] ENDOSCOPY;  Service: Gastroenterology;  Laterality: N/A;  . DIAGNOSTIC LAPAROSCOPY    . ESOPHAGOGASTRODUODENOSCOPY (EGD) WITH PROPOFOL N/A 12/17/2019   Procedure: ESOPHAGOGASTRODUODENOSCOPY (EGD) WITH PROPOFOL;  Surgeon: Toledo, Benay Pike, MD;  Location: [facility] ENDOSCOPY;  Service: Gastroenterology;  Laterality: N/A;  . EYE SURGERY    . GALLBLADDER SURGERY  06/04/2014  . UPPER GASTROINTESTINAL ENDOSCOPY      Social History   Tobacco Use  . Smoking status: Former Research scientist (life sciences)  . Smokeless tobacco: Never Used  Vaping Use  . Vaping Use: Never used  Substance Use Topics  . Alcohol use: Yes    Alcohol/week: 1.0 standard drink    Types: 1 Cans of beer per week  . Drug use: No     Medication list has been reviewed and updated.  Current Meds  Medication Sig  . Apremilast 30 MG TABS Take 1 tablet (30 mg total) by mouth 2 (two) times daily. Derm  . Cholecalciferol (VITAMIN D) 2000 units tablet Take 2,000 Units by mouth daily.  Marland Kitchen co-enzyme Q-10 30 MG capsule Take 30 mg by mouth 3 (three) times daily.  . DELZICOL 400 MG CPDR DR capsule Take 2 tablets by mouth 2 (two) times daily. GI Dr  . lansoprazole (PREVACID) 30 MG capsule Take 1 capsule by mouth daily. GI Doc  . lisinopril-hydrochlorothiazide (ZESTORETIC) 10-12.5 MG tablet Take 1 tablet by mouth daily.  . meclizine (ANTIVERT) 25 MG tablet Take 1 tablet (25 mg total) by mouth 3 (three) times daily as needed for dizziness.  . rosuvastatin (CRESTOR) 20 MG tablet Take 1 tablet (20 mg total) by mouth daily.  . [DISCONTINUED] Apremilast (OTEZLA) 30 MG TABS Take 1 tablet  (30 mg total) by mouth 2 (two) times daily.  . [DISCONTINUED] aspirin EC 81 MG tablet Take 81 mg by mouth daily. Swallow whole.    PHQ 2/9 Scores 04/30/2020 10/24/2019 10/23/2018 04/17/2018  PHQ - 2 Score 0 0 0 0  PHQ- 9 Score 0 0 0 0    GAD 7 : Generalized Anxiety Score 04/30/2020 10/24/2019  Nervous, Anxious, on Edge 0 0  Control/stop worrying 0 0  Worry too much - different things 0 0  Trouble relaxing 0 0  Restless 0 0  Easily annoyed or irritable 0 0  Afraid - awful might happen 0 0  Total GAD 7 Score 0 0    BP Readings from Last 3 Encounters:  04/30/20 120/80  12/17/19 108/71  10/24/19 130/80    Physical Exam Vitals and nursing note reviewed.  Constitutional:      Appearance: She is well-developed.  HENT:     Head: Normocephalic.     Right Ear: Tympanic membrane, ear canal and external ear normal.     Left Ear: Tympanic membrane, ear canal and external ear normal.     Nose: Nose normal.     Mouth/Throat:     Mouth: Mucous membranes are moist.  Eyes:     General: Lids are everted, no foreign  bodies appreciated. No scleral icterus.       Left eye: No foreign body or hordeolum.     Conjunctiva/sclera: Conjunctivae normal.     Right eye: Right conjunctiva is not injected.     Left eye: Left conjunctiva is not injected.     Pupils: Pupils are equal, round, and reactive to light.  Neck:     Thyroid: No thyromegaly.     Vascular: No JVD.     Trachea: No tracheal deviation.  Cardiovascular:     Rate and Rhythm: Normal rate and regular rhythm.     Heart sounds: Normal heart sounds. No murmur heard.  No friction rub. No gallop.   Pulmonary:     Effort: Pulmonary effort is normal. No respiratory distress.     Breath sounds: Normal breath sounds. No wheezing, rhonchi or rales.  Abdominal:     General: Bowel sounds are normal.     Palpations: Abdomen is soft. There is no mass.     Tenderness: There is no abdominal tenderness. There is no guarding or rebound.    Musculoskeletal:        General: No tenderness. Normal range of motion.     Cervical back: Normal range of motion and neck supple.  Lymphadenopathy:     Cervical: No cervical adenopathy.  Skin:    General: Skin is warm.     Findings: No erythema or rash.  Neurological:     Mental Status: She is alert and oriented to person, place, and time.     Cranial Nerves: No cranial nerve deficit.     Deep Tendon Reflexes: Reflexes normal.  Psychiatric:        Mood and Affect: Mood is not anxious or depressed.     Wt Readings from Last 3 Encounters:  04/30/20 224 lb (101.6 kg)  12/17/19 221 lb (100.2 kg)  10/24/19 229 lb (103.9 kg)    BP 120/80   Pulse 64   Ht 5\' 6"  (1.676 m)   Wt 224 lb (101.6 kg)   BMI 36.15 kg/m    Assessment and Plan:  1. Essential (primary) hypertension Chronic.  Controlled.  Stable.  Continue lisinopril hydrochlorothiazide 10-12.5 mg once a day. - lisinopril-hydrochlorothiazide (ZESTORETIC) 10-12.5 MG tablet; Take 1 tablet by mouth daily.  Dispense: 90 tablet; Refill: 1  2. Mixed hyperlipidemia Review of lipids in the past 3 years patient has a mixed hyperlipidemia and has responded to Crestor as of approximately 1 year ago LDL was 87.  We will recheck the lipid profile for current status. - Lipid Panel With LDL/HDL Ratio

## 2020-05-01 LAB — LIPID PANEL WITH LDL/HDL RATIO
Cholesterol, Total: 171 mg/dL (ref 100–199)
HDL: 60 mg/dL (ref >39)
LDL Chol Calc (NIH): 84 mg/dL (ref 0–99)
LDL/HDL Ratio: 1.4 ratio (ref 0.0–3.2)
Triglycerides: 159 mg/dL — ABNORMAL HIGH (ref 0–149)
VLDL Cholesterol Cal: 27 mg/dL (ref 5–40)

## 2020-05-07 ENCOUNTER — Ambulatory Visit: Admitting: Dermatology

## 2020-05-14 ENCOUNTER — Ambulatory Visit (INDEPENDENT_AMBULATORY_CARE_PROVIDER_SITE_OTHER): Payer: Medicare Other | Admitting: Dermatology

## 2020-05-14 ENCOUNTER — Other Ambulatory Visit: Payer: Self-pay

## 2020-05-14 DIAGNOSIS — M9903 Segmental and somatic dysfunction of lumbar region: Secondary | ICD-10-CM | POA: Diagnosis not present

## 2020-05-14 DIAGNOSIS — M9905 Segmental and somatic dysfunction of pelvic region: Secondary | ICD-10-CM | POA: Diagnosis not present

## 2020-05-14 DIAGNOSIS — B079 Viral wart, unspecified: Secondary | ICD-10-CM | POA: Diagnosis not present

## 2020-05-14 DIAGNOSIS — M5136 Other intervertebral disc degeneration, lumbar region: Secondary | ICD-10-CM | POA: Diagnosis not present

## 2020-05-14 DIAGNOSIS — M5416 Radiculopathy, lumbar region: Secondary | ICD-10-CM | POA: Diagnosis not present

## 2020-05-14 NOTE — Patient Instructions (Addendum)
Discussed viral etiology and risk of spread.  Discussed multiple treatments may be required to clear warts.  Discussed possible post-treatment dyspigmentation and risk of recurrence.  Take bandages off and wash off in 4-6 hours.

## 2020-05-14 NOTE — Progress Notes (Signed)
   Follow-Up Visit   Subjective  Theresa Gross is a 64 y.o. female who presents for the following: Warts (2 week recheck, left 4th finger tip, right 3rd lateral finger, improved with LN2 treatments).  They are almost gone.   The following portions of the chart were reviewed this encounter and updated as appropriate:      Review of Systems:  No other skin or systemic complaints except as noted in HPI or Assessment and Plan.  Objective  Well appearing patient in no apparent distress; mood and affect are within normal limits.  A focused examination was performed including fingers. Relevant physical exam findings are noted in the Assessment and Plan.  Objective  Right 3rd lateral finger x 1, Left 4th fingertip x 2 (3): Small residual depressed macules 1.23mm to both areas.    Assessment & Plan  Viral warts, unspecified type (3) Right 3rd lateral finger x 1, Left 4th fingertip x 2  Improved, small residual Discussed viral etiology and risk of spread.  Discussed multiple treatments may be required to clear warts.  Discussed possible post-treatment dyspigmentation and risk of recurrence.  LN2 treatment followed by Cantharone Plus and bandage. Wash off in 4-6 hours.  Destruction of lesion - Right 3rd lateral finger x 1, Left 4th fingertip x 2  Destruction method: cryotherapy   Informed consent: discussed and consent obtained   Lesion destroyed using liquid nitrogen: Yes   Region frozen until ice ball extended beyond lesion: Yes   Outcome: patient tolerated procedure well with no complications   Post-procedure details: wound care instructions given    Return in about 4 weeks (around 06/11/2020) for warts.   IJamesetta Orleans, CMA, am acting as scribe for [name], MD .  Documentation: I have reviewed the above documentation for accuracy and completeness, and I agree with the above.  [name] MD

## 2020-05-22 DIAGNOSIS — M722 Plantar fascial fibromatosis: Secondary | ICD-10-CM | POA: Diagnosis not present

## 2020-06-02 ENCOUNTER — Telehealth: Payer: Self-pay

## 2020-06-02 NOTE — Telephone Encounter (Signed)
Copied from [place] [id number]. Topic: General - Other >> Jun 02, 2020  3:57 PM Celene Kras wrote: Reason for CRM: Pt called and is requesting to speak with Baxter Flattery regarding her Shingrx shot. She states that she contacted insurance and that they said they would cover it. Please advise.

## 2020-06-03 NOTE — Telephone Encounter (Signed)
Patient called and said she spoke with her insurance who claims she can get her 2nd shingles vaccine here. Informed her unfortunately because she has medicare we cannot give her shingles vaccine her in the clinic and promise coverage for it. She needs to go back to [facility] where she got her first shot and get her 2nd one.

## 2020-06-09 ENCOUNTER — Other Ambulatory Visit: Payer: Self-pay

## 2020-06-09 ENCOUNTER — Ambulatory Visit (INDEPENDENT_AMBULATORY_CARE_PROVIDER_SITE_OTHER): Payer: Medicare Other | Admitting: Dermatology

## 2020-06-09 DIAGNOSIS — B079 Viral wart, unspecified: Secondary | ICD-10-CM

## 2020-06-09 NOTE — Progress Notes (Signed)
   Follow-Up Visit   Subjective  Theresa Gross is a 64 y.o. female who presents for the following: Warts (L 3rd lateral finger, L 4th fingertip x 2, 4wk f/u, pt thinks cleared with LN2 and Cantarone plus).   The following portions of the chart were reviewed this encounter and updated as appropriate:      Review of Systems:  No other skin or systemic complaints except as noted in HPI or Assessment and Plan.  Objective  Well appearing patient in no apparent distress; mood and affect are within normal limits.  A focused examination was performed including hands. Relevant physical exam findings are noted in the Assessment and Plan.  Objective  L 3rd lat finger, L 4th fingertip: L 3rd lat finger clear, L 4th fingertip clear   Assessment & Plan  Viral warts, unspecified type L 3rd lat finger, L 4th fingertip  Resolved with Ln2, Cantharone plus treatment  Observe for recurrence  Return for As scheduled.  I, Othelia Pulling, RMA, am acting as scribe for [name], MD . Documentation: I have reviewed the above documentation for accuracy and completeness, and I agree with the above.  [name] MD

## 2020-06-12 DIAGNOSIS — M5136 Other intervertebral disc degeneration, lumbar region: Secondary | ICD-10-CM | POA: Diagnosis not present

## 2020-06-12 DIAGNOSIS — M5416 Radiculopathy, lumbar region: Secondary | ICD-10-CM | POA: Diagnosis not present

## 2020-06-12 DIAGNOSIS — M9905 Segmental and somatic dysfunction of pelvic region: Secondary | ICD-10-CM | POA: Diagnosis not present

## 2020-06-12 DIAGNOSIS — M9903 Segmental and somatic dysfunction of lumbar region: Secondary | ICD-10-CM | POA: Diagnosis not present

## 2020-07-09 DIAGNOSIS — Z800 Family history of malignant neoplasm of digestive organs: Secondary | ICD-10-CM | POA: Diagnosis not present

## 2020-07-09 DIAGNOSIS — M5416 Radiculopathy, lumbar region: Secondary | ICD-10-CM | POA: Diagnosis not present

## 2020-07-09 DIAGNOSIS — M5136 Other intervertebral disc degeneration, lumbar region: Secondary | ICD-10-CM | POA: Diagnosis not present

## 2020-07-09 DIAGNOSIS — M9903 Segmental and somatic dysfunction of lumbar region: Secondary | ICD-10-CM | POA: Diagnosis not present

## 2020-07-09 DIAGNOSIS — M9905 Segmental and somatic dysfunction of pelvic region: Secondary | ICD-10-CM | POA: Diagnosis not present

## 2020-07-09 DIAGNOSIS — K449 Diaphragmatic hernia without obstruction or gangrene: Secondary | ICD-10-CM | POA: Diagnosis not present

## 2020-07-09 DIAGNOSIS — K219 Gastro-esophageal reflux disease without esophagitis: Secondary | ICD-10-CM | POA: Diagnosis not present

## 2020-07-09 DIAGNOSIS — E559 Vitamin D deficiency, unspecified: Secondary | ICD-10-CM | POA: Diagnosis not present

## 2020-07-09 DIAGNOSIS — K5150 Left sided colitis without complications: Secondary | ICD-10-CM | POA: Diagnosis not present

## 2020-07-14 DIAGNOSIS — M8588 Other specified disorders of bone density and structure, other site: Secondary | ICD-10-CM | POA: Diagnosis not present

## 2020-07-14 LAB — HM DEXA SCAN

## 2020-07-28 ENCOUNTER — Telehealth: Payer: Self-pay

## 2020-07-28 ENCOUNTER — Other Ambulatory Visit: Payer: Self-pay | Admitting: Family Medicine

## 2020-07-28 DIAGNOSIS — E7800 Pure hypercholesterolemia, unspecified: Secondary | ICD-10-CM

## 2020-07-28 MED ORDER — ROSUVASTATIN CALCIUM 20 MG PO TABS: 20.0000 mg | ORAL_TABLET | Freq: Every day | ORAL | 1 refills | Status: DC

## 2020-07-28 NOTE — Telephone Encounter (Unsigned)
Copied from [place] [phone] [id number]. Topic: General - Other >> Jul 28, 2020 10:56 AM Leward Quan A wrote: Reason for CRM: Patient called in to inform Dr Ronnald Ramp that she have the cough and snotty nose that she get yearly been going about a week or longer. Keep her up at night asking for something please or a call if she need to have a visit. Can be reached at Ph# [phone]

## 2020-07-28 NOTE — Telephone Encounter (Signed)
Copied from [place] [phone]. Topic: Quick Communication - Rx Refill/Question >> Jul 28, 2020 10:59 AM Leward Quan A wrote: Medication: rosuvastatin (CRESTOR) 20 MG tablet  Has the patient contacted their pharmacy? Yes.   (Agent: If no, request that the patient contact the pharmacy for the refill.) (Agent: If yes, when and what did the pharmacy advise?)  Preferred Pharmacy (with phone number or street name): [facility] [address]  Phone:  [phone] Fax:  [phone]     Agent: Please be advised that RX refills may take up to 3 business days. We ask that you follow-up with your pharmacy.

## 2020-07-28 NOTE — Telephone Encounter (Signed)
Spoke to pt and scheduled for 07/29/20

## 2020-07-28 NOTE — Telephone Encounter (Signed)
Please call and offer appt for this afternoon or tomorrow

## 2020-07-29 ENCOUNTER — Other Ambulatory Visit: Payer: Self-pay

## 2020-07-29 ENCOUNTER — Ambulatory Visit (INDEPENDENT_AMBULATORY_CARE_PROVIDER_SITE_OTHER): Payer: Medicare Other | Admitting: Family Medicine

## 2020-07-29 ENCOUNTER — Encounter: Payer: Self-pay | Admitting: Family Medicine

## 2020-07-29 VITALS — BP 124/80 | HR 68 | Temp 98.5°F | Ht 66.0 in | Wt 218.0 lb

## 2020-07-29 DIAGNOSIS — M85852 Other specified disorders of bone density and structure, left thigh: Secondary | ICD-10-CM | POA: Diagnosis not present

## 2020-07-29 DIAGNOSIS — R059 Cough, unspecified: Secondary | ICD-10-CM | POA: Diagnosis not present

## 2020-07-29 DIAGNOSIS — J019 Acute sinusitis, unspecified: Secondary | ICD-10-CM | POA: Diagnosis not present

## 2020-07-29 DIAGNOSIS — M85851 Other specified disorders of bone density and structure, right thigh: Secondary | ICD-10-CM | POA: Diagnosis not present

## 2020-07-29 MED ORDER — GUAIFENESIN-CODEINE 100-10 MG/5ML PO SYRP: 5.0000 mL | ORAL_SOLUTION | Freq: Four times a day (QID) | ORAL | 0 refills | Status: DC | PRN

## 2020-07-29 MED ORDER — FLUCONAZOLE 150 MG PO TABS
150.0000 mg | ORAL_TABLET | Freq: Once | ORAL | 0 refills | Status: AC
Start: 2020-07-29 — End: 2020-07-29

## 2020-07-29 MED ORDER — AMOXICILLIN-POT CLAVULANATE 875-125 MG PO TABS: 1.0000 | ORAL_TABLET | Freq: Two times a day (BID) | ORAL | 0 refills | Status: DC

## 2020-07-29 NOTE — Patient Instructions (Signed)
Osteopenia  Osteopenia is a loss of thickness (density) inside the bones. Another name for osteopenia is low bone mass. Mild osteopenia is a normal part of aging. It is not a disease, and it does not cause symptoms. However, if you have osteopenia and continue to lose bone mass, you could develop a condition that causes the bones to become thin and break more easily (osteoporosis). Osteoporosis can cause you to lose some height, have back pain, and have a stooped posture. Although osteopenia is not a disease, making changes to your lifestyle and diet can help to prevent osteopenia from developing into osteoporosis. What are the causes? Osteopenia is caused by loss of calcium in the bones. Bones are constantly changing. Old bone cells are continually being replaced with new bone cells. This process builds new bone. The mineral calcium is needed to build new bone and maintain bone density. Bone density is usually highest around age 35. After that, most people's bodies cannot replace all the bone they have lost with new bone. What increases the risk? You are more likely to develop this condition if:  You are older than age 50.  You are a woman who went through menopause early.  You have a long illness that keeps you in bed.  You do not get enough exercise.  You lack certain nutrients (malnutrition).  You have an overactive thyroid gland (hyperthyroidism).  You use products that contain nicotine or tobacco, such as cigarettes, e-cigarettes and chewing tobacco, or you drink a lot of alcohol.  You are taking medicines that weaken the bones, such as steroids. What are the signs or symptoms? This condition does not cause any symptoms. You may have a slightly higher risk for bone breaks (fractures), so getting fractures more easily than normal may be an indication of osteopenia. How is this diagnosed? This condition may be diagnosed based on an X-ray exam that measures bone density (dual-energy  X-ray absorptiometry, or DEXA). This test can measure bone density in your hips, spine, and wrists. Osteopenia has no symptoms, so this condition is usually diagnosed after a routine bone density screening test is done for osteoporosis. This routine screening is usually done for:  Women who are age 65 or older.  Men who are age 70 or older. If you have risk factors for osteopenia, you may have the screening test at an earlier age. How is this treated? Making dietary and lifestyle changes can lower your risk for osteoporosis. If you have severe osteopenia that is close to becoming osteoporosis, this condition can be treated with medicines and dietary supplements such as calcium and vitamin D. These supplements help to rebuild bone density. Follow these instructions at home: Eating and drinking Eat a diet that is high in calcium and vitamin D.  Calcium is found in dairy products, beans, salmon, and leafy green vegetables like spinach and broccoli.  Look for foods that have vitamin D and calcium added to them (fortified foods), such as orange juice, cereal, and bread.   Lifestyle  Do 30 minutes or more of a weight-bearing exercise every day, such as walking, jogging, or playing a sport. These types of exercises strengthen the bones.  Do not use any products that contain nicotine or tobacco, such as cigarettes, e-cigarettes, and chewing tobacco. If you need help quitting, ask your health care provider.  Do not drink alcohol if: ? Your health care provider tells you not to drink. ? You are pregnant, may be pregnant, or are planning to become   pregnant.  If you drink alcohol: ? Limit how much you use to:  0-1 drink a day for women.  0-2 drinks a day for men. ? Be aware of how much alcohol is in your drink. In the [place] one drink equals one 12 oz bottle of beer (355 mL), one 5 oz glass of wine (148 mL), or one 1 oz glass of hard liquor (44 mL). General instructions  Take over-the-counter  and prescription medicines only as told by your health care provider. These include vitamins and supplements.  Take precautions at home to lower your risk of falling, such as: ? Keeping rooms well-lit and free of clutter, such as cords. ? Installing safety rails on stairs. ? Using rubber mats in the bathroom or other areas that are often wet or slippery.  Keep all follow-up visits. This is important. Contact a health care provider if:  You have not had a bone density screening for osteoporosis and you are: ? A woman who is age 65 or older. ? A man who is age 70 or older.  You are a postmenopausal woman who has not had a bone density screening for osteoporosis.  You are older than age 50 and you want to know if you should have bone density screening for osteoporosis. Summary  Osteopenia is a loss of thickness (density) inside the bones. Another name for osteopenia is low bone mass.  Osteopenia is not a disease, but it may increase your risk for a condition that causes the bones to become thin and break more easily (osteoporosis).  You may be at risk for osteopenia if you are older than age 50 or if you are a woman who went through early menopause.  Osteopenia does not cause any symptoms, but it can be diagnosed with a bone density screening test.  Dietary and lifestyle changes are the first treatment for osteopenia. These may lower your risk for osteoporosis. This information is not intended to replace advice given to you by your health care provider. Make sure you discuss any questions you have with your health care provider. Document Revised: 12/06/2019 Document Reviewed: 12/06/2019 Elsevier Patient Education  2021 Elsevier Inc.  

## 2020-07-29 NOTE — Progress Notes (Signed)
Date:  07/29/2020   Name:  Theresa Gross   DOB:  07/10/1955   MRN:  [id number]   Chief Complaint: Cough (X1 week, no fever, drainage, mucous (yellow))  Cough This is a new problem. The current episode started in the past 7 days. The problem has been waxing and waning. The cough is productive of purulent sputum. Associated symptoms include nasal congestion, postnasal drip and rhinorrhea. Pertinent negatives include no chest pain, chills, ear congestion, ear pain, eye redness, fever, headaches, heartburn, hemoptysis, myalgias, rash, sore throat, shortness of breath, sweats, weight loss or wheezing. She has tried OTC cough suppressant for the symptoms. The treatment provided mild relief. There is no history of environmental allergies.  Sinusitis This is a new problem. The current episode started in the past 7 days. The problem has been gradually worsening since onset. There has been no fever. Associated symptoms include congestion, coughing and sinus pressure. Pertinent negatives include no chills, ear pain, headaches, shortness of breath, sneezing, sore throat or swollen glands.    Lab Results  Component Value Date   CREATININE 0.71 06/13/2019   BUN 10 06/13/2019   NA 140 06/13/2019   K 4.4 06/13/2019   CL 104 06/13/2019   CO2 23 06/13/2019   Lab Results  Component Value Date   CHOL 171 04/30/2020   HDL 60 04/30/2020   LDLCALC 84 04/30/2020   TRIG 159 (H) 04/30/2020   CHOLHDL 4.6 (H) 08/23/2017   No results found for: TSH No results found for: HGBA1C Lab Results  Component Value Date   WBC 6.9 05/17/2013   HGB 13.7 05/17/2013   HCT 39.4 05/17/2013   MCV 94 05/17/2013   PLT 241 05/17/2013   Lab Results  Component Value Date   ALT 28 06/13/2019   AST 25 06/13/2019   ALKPHOS 77 06/13/2019   BILITOT 0.5 06/13/2019     Review of Systems  Constitutional: Negative.  Negative for chills, fatigue, fever, unexpected weight change and weight loss.  HENT: Positive for  congestion, postnasal drip, rhinorrhea and sinus pressure. Negative for ear discharge, ear pain, sneezing and sore throat.   Eyes: Negative for double vision, photophobia, pain, discharge, redness and itching.  Respiratory: Positive for cough. Negative for hemoptysis, shortness of breath, wheezing and stridor.   Cardiovascular: Negative for chest pain.  Gastrointestinal: Negative for abdominal pain, blood in stool, constipation, diarrhea, heartburn, nausea and vomiting.  Endocrine: Negative for cold intolerance, heat intolerance, polydipsia, polyphagia and polyuria.  Genitourinary: Negative for dysuria, flank pain, frequency, hematuria, menstrual problem, pelvic pain, urgency, vaginal bleeding and vaginal discharge.  Musculoskeletal: Negative for arthralgias, back pain and myalgias.  Skin: Negative for rash.  Allergic/Immunologic: Negative for environmental allergies and food allergies.  Neurological: Negative for dizziness, weakness, light-headedness, numbness and headaches.  Hematological: Negative for adenopathy. Does not bruise/bleed easily.  Psychiatric/Behavioral: Negative for dysphoric mood. The patient is not nervous/anxious.     Patient Active Problem List   Diagnosis Date Noted  . Class 2 obesity due to excess calories without serious comorbidity with body mass index (BMI) of 37.0 to 37.9 in adult 02/16/2017  . Pure hypercholesterolemia 02/16/2017  . Aortic atherosclerosis ([place]) 05/25/2016  . Steatosis of liver 05/25/2016  . Mixed hyperlipidemia 05/25/2016  . Essential (primary) hypertension 10/28/2014  . Gastro-esophageal reflux disease without esophagitis 10/28/2014  . H/O psoriasis 10/28/2014  . Decreased potassium in the blood 10/28/2014  . Familial multiple lipoprotein-type hyperlipidemia 10/28/2014    Allergies  Allergen Reactions  .  Ciprofloxacin Nausea And Vomiting  . Lipitor [Atorvastatin] Other (See Comments)    Body Aches  . Metronidazole Nausea And Vomiting  .  Nsaids Other (See Comments)    Have to be careful due to liver    Past Surgical History:  Procedure Laterality Date  . ABDOMINAL HYSTERECTOMY    . BREAST BIOPSY Right 10 plus yrs ago   stereo bx./clip. Benign  . CHOLECYSTECTOMY    . COLONOSCOPY WITH PROPOFOL N/A 06/16/2016   Procedure: COLONOSCOPY WITH PROPOFOL;  Surgeon: Scot Junobert T Elliott, MD;  [facility];  Service: Endoscopy;  Laterality: N/A;  . COLONOSCOPY WITH PROPOFOL N/A 12/17/2019   Procedure: COLONOSCOPY WITH PROPOFOL;  Surgeon: Toledo, Boykin Nearingeodoro K, MD;  Location: [facility] ENDOSCOPY;  Service: Gastroenterology;  Laterality: N/A;  . DIAGNOSTIC LAPAROSCOPY    . ESOPHAGOGASTRODUODENOSCOPY (EGD) WITH PROPOFOL N/A 12/17/2019   Procedure: ESOPHAGOGASTRODUODENOSCOPY (EGD) WITH PROPOFOL;  Surgeon: Toledo, Boykin Nearingeodoro K, MD;  Location: [facility] ENDOSCOPY;  Service: Gastroenterology;  Laterality: N/A;  . EYE SURGERY    . GALLBLADDER SURGERY  06/04/2014  . UPPER GASTROINTESTINAL ENDOSCOPY      Social History   Tobacco Use  . Smoking status: Former Games developermoker  . Smokeless tobacco: Never Used  Vaping Use  . Vaping Use: Never used  Substance Use Topics  . Alcohol use: Yes    Alcohol/week: 1.0 standard drink    Types: 1 Cans of beer per week  . Drug use: No     Medication list has been reviewed and updated.  Current Meds  Medication Sig  . Apremilast 30 MG TABS Take 1 tablet (30 mg total) by mouth 2 (two) times daily. Derm  . Cholecalciferol (VITAMIN D) 2000 units tablet Take 2,000 Units by mouth daily.  Marland Kitchen. co-enzyme Q-10 30 MG capsule Take 30 mg by mouth daily.  . DELZICOL 400 MG CPDR DR capsule Take 2 tablets by mouth 2 (two) times daily. GI Dr  . lansoprazole (PREVACID) 30 MG capsule Take 1 capsule by mouth daily. GI Doc  . lisinopril-hydrochlorothiazide (ZESTORETIC) 10-12.5 MG tablet Take 1 tablet by mouth daily.  . meclizine (ANTIVERT) 25 MG tablet Take 1 tablet (25 mg total) by mouth 3 (three) times daily as needed for  dizziness.  . nystatin-triamcinolone (MYCOLOG II) cream Apply 1 application topically 2 (two) times daily. PRN/ Derm  . rosuvastatin (CRESTOR) 20 MG tablet Take 1 tablet (20 mg total) by mouth daily.    PHQ 2/9 Scores 07/29/2020 04/30/2020 10/24/2019 10/23/2018  PHQ - 2 Score 0 0 0 0  PHQ- 9 Score 1 0 0 0    GAD 7 : Generalized Anxiety Score 07/29/2020 04/30/2020 10/24/2019  Nervous, Anxious, on Edge 0 0 0  Control/stop worrying 0 0 0  Worry too much - different things 0 0 0  Trouble relaxing 0 0 0  Restless 0 0 0  Easily annoyed or irritable 0 0 0  Afraid - awful might happen 0 0 0  Total GAD 7 Score 0 0 0    BP Readings from Last 3 Encounters:  07/29/20 124/80  04/30/20 120/80  12/17/19 108/71    Physical Exam Vitals and nursing note reviewed.  Constitutional:      Appearance: She is well-developed and well-nourished.  HENT:     Head: Normocephalic.     Right Ear: Tympanic membrane, ear canal and external ear normal.     Left Ear: Tympanic membrane, ear canal and external ear normal.     Nose: Nose normal.  Mouth/Throat:     Mouth: Oropharynx is clear and moist. Mucous membranes are moist.  Eyes:     General: Lids are everted, no foreign bodies appreciated. No scleral icterus.       Left eye: No foreign body or hordeolum.     Extraocular Movements: EOM normal.     Conjunctiva/sclera: Conjunctivae normal.     Right eye: Right conjunctiva is not injected.     Left eye: Left conjunctiva is not injected.     Pupils: Pupils are equal, round, and reactive to light.  Neck:     Thyroid: No thyromegaly.     Vascular: No JVD.     Trachea: No tracheal deviation.  Cardiovascular:     Rate and Rhythm: Normal rate and regular rhythm.     Pulses: Intact distal pulses.     Heart sounds: Normal heart sounds. No murmur heard. No friction rub. No gallop.   Pulmonary:     Effort: Pulmonary effort is normal. No respiratory distress.     Breath sounds: Normal breath sounds. No  stridor. No wheezing, rhonchi or rales.  Chest:     Chest wall: No tenderness.  Abdominal:     General: Bowel sounds are normal.     Palpations: Abdomen is soft. There is no hepatosplenomegaly or mass.     Tenderness: There is no abdominal tenderness. There is no guarding or rebound.  Musculoskeletal:        General: No tenderness or edema. Normal range of motion.     Cervical back: Normal range of motion and neck supple.  Lymphadenopathy:     Cervical: No cervical adenopathy.  Skin:    General: Skin is warm.     Capillary Refill: Capillary refill takes less than 2 seconds.     Findings: No rash.  Neurological:     General: No focal deficit present.     Mental Status: She is alert and oriented to person, place, and time.     Cranial Nerves: No cranial nerve deficit.     Deep Tendon Reflexes: Strength normal. Reflexes normal.  Psychiatric:        Mood and Affect: Mood and affect normal. Mood is not anxious or depressed.     Wt Readings from Last 3 Encounters:  07/29/20 218 lb (98.9 kg)  04/30/20 224 lb (101.6 kg)  12/17/19 221 lb (100.2 kg)    BP 124/80   Pulse 68   Temp 98.5 F (36.9 C) (Oral)   Ht 5\' 6"  (1.676 m)   Wt 218 lb (98.9 kg)   SpO2 99%   BMI 35.19 kg/m   Assessment and Plan:  1. Acute sinusitis, recurrence not specified, unspecified location New onset.  Persistent.  Stable.  Patient has postnasal drainage and productive upper and lower respiratory productive cough.  We will treat with Augmentin 875 mg 1 twice a day. Diflucan was given to be taken after completion of therapy for likely candidiasis. 2. Cough New onset.  Persistent.  Particularly at night patient is unable to get rest and we will prescribe Robitussin-AC 1 teaspoon as needed as needed cough.  3. Osteopenia of both hips Patient brings DEXA scan from outside clinic which notes scores that are consistent with osteopenia.  Patient has been given information has been encouraged to take at least  800 units vitamin D and add supplemental calcium of 1200 mg.

## 2020-08-06 ENCOUNTER — Encounter: Payer: Self-pay | Admitting: Family Medicine

## 2020-08-06 ENCOUNTER — Other Ambulatory Visit: Payer: Self-pay

## 2020-08-06 ENCOUNTER — Ambulatory Visit (INDEPENDENT_AMBULATORY_CARE_PROVIDER_SITE_OTHER): Payer: Medicare Other | Admitting: Family Medicine

## 2020-08-06 VITALS — BP 130/72 | HR 76 | Ht 66.0 in | Wt 220.0 lb

## 2020-08-06 DIAGNOSIS — M9905 Segmental and somatic dysfunction of pelvic region: Secondary | ICD-10-CM | POA: Diagnosis not present

## 2020-08-06 DIAGNOSIS — M9903 Segmental and somatic dysfunction of lumbar region: Secondary | ICD-10-CM | POA: Diagnosis not present

## 2020-08-06 DIAGNOSIS — M5136 Other intervertebral disc degeneration, lumbar region: Secondary | ICD-10-CM | POA: Diagnosis not present

## 2020-08-06 DIAGNOSIS — I83813 Varicose veins of bilateral lower extremities with pain: Secondary | ICD-10-CM | POA: Diagnosis not present

## 2020-08-06 DIAGNOSIS — M5416 Radiculopathy, lumbar region: Secondary | ICD-10-CM | POA: Diagnosis not present

## 2020-08-06 DIAGNOSIS — I809 Phlebitis and thrombophlebitis of unspecified site: Secondary | ICD-10-CM

## 2020-08-06 NOTE — Progress Notes (Signed)
Date:  08/06/2020   Name:  Theresa Gross   DOB:  [birth year]   MRN:  [id number]   Chief Complaint: Varicose Veins (Needs referral to Dr Lucky Cowboy for further evaluation)  Leg Pain  The incident occurred more than 1 week ago. There was no injury mechanism. The pain is present in the left thigh and right thigh (thrombophebitis right/varicose left). The quality of the pain is described as aching and burning. The pain is at a severity of 7/10. The pain is moderate. The pain has been constant since onset. Pertinent negatives include no numbness. The symptoms are aggravated by palpation. She has tried NSAIDs for the symptoms.    Lab Results  Component Value Date   CREATININE 0.71 06/13/2019   BUN 10 06/13/2019   NA 140 06/13/2019   K 4.4 06/13/2019   CL 104 06/13/2019   CO2 23 06/13/2019   Lab Results  Component Value Date   CHOL 171 04/30/2020   HDL 60 04/30/2020   LDLCALC 84 04/30/2020   TRIG 159 (H) 04/30/2020   CHOLHDL 4.6 (H) 08/23/2017   No results found for: TSH No results found for: HGBA1C Lab Results  Component Value Date   WBC 6.9 05/17/2013   HGB 13.7 05/17/2013   HCT 39.4 05/17/2013   MCV 94 05/17/2013   PLT 241 05/17/2013   Lab Results  Component Value Date   ALT 28 06/13/2019   AST 25 06/13/2019   ALKPHOS 77 06/13/2019   BILITOT 0.5 06/13/2019     Review of Systems  Constitutional: Negative.  Negative for chills, fatigue, fever and unexpected weight change.  HENT: Negative for congestion, ear discharge, ear pain, rhinorrhea, sinus pressure, sneezing and sore throat.   Eyes: Negative for double vision, photophobia, pain, discharge, redness and itching.  Respiratory: Negative for cough, shortness of breath, wheezing and stridor.   Gastrointestinal: Negative for abdominal pain, blood in stool, constipation, diarrhea, nausea and vomiting.  Endocrine: Negative for cold intolerance, heat intolerance, polydipsia, polyphagia and polyuria.  Genitourinary: Negative  for dysuria, flank pain, frequency, hematuria, menstrual problem, pelvic pain, urgency, vaginal bleeding and vaginal discharge.  Musculoskeletal: Negative for arthralgias, back pain and myalgias.  Skin: Negative for rash.  Allergic/Immunologic: Negative for environmental allergies and food allergies.  Neurological: Negative for dizziness, weakness, light-headedness, numbness and headaches.  Hematological: Negative for adenopathy. Does not bruise/bleed easily.  Psychiatric/Behavioral: Negative for dysphoric mood. The patient is not nervous/anxious.     Patient Active Problem List   Diagnosis Date Noted  . Class 2 obesity due to excess calories without serious comorbidity with body mass index (BMI) of 37.0 to 37.9 in adult 02/16/2017  . Pure hypercholesterolemia 02/16/2017  . Aortic atherosclerosis ([place]) 05/25/2016  . Steatosis of liver 05/25/2016  . Mixed hyperlipidemia 05/25/2016  . Essential (primary) hypertension 10/28/2014  . Gastro-esophageal reflux disease without esophagitis 10/28/2014  . H/O psoriasis 10/28/2014  . Decreased potassium in the blood 10/28/2014  . Familial multiple lipoprotein-type hyperlipidemia 10/28/2014    Allergies  Allergen Reactions  . Ciprofloxacin Nausea And Vomiting  . Lipitor [Atorvastatin] Other (See Comments)    Body Aches  . Metronidazole Nausea And Vomiting  . Nsaids Other (See Comments)    Have to be careful due to liver    Past Surgical History:  Procedure Laterality Date  . ABDOMINAL HYSTERECTOMY    . BREAST BIOPSY Right 10 plus yrs ago   stereo bx./clip. Benign  . CHOLECYSTECTOMY    . COLONOSCOPY WITH PROPOFOL  N/A 06/16/2016   Procedure: COLONOSCOPY WITH PROPOFOL;  Surgeon: Manya Silvas, MD;  Location: [facility] ENDOSCOPY;  Service: Endoscopy;  Laterality: N/A;  . COLONOSCOPY WITH PROPOFOL N/A 12/17/2019   Procedure: COLONOSCOPY WITH PROPOFOL;  Surgeon: Toledo, Benay Pike, MD;  Location: [facility] ENDOSCOPY;  Service: Gastroenterology;   Laterality: N/A;  . DIAGNOSTIC LAPAROSCOPY    . ESOPHAGOGASTRODUODENOSCOPY (EGD) WITH PROPOFOL N/A 12/17/2019   Procedure: ESOPHAGOGASTRODUODENOSCOPY (EGD) WITH PROPOFOL;  Surgeon: Toledo, Benay Pike, MD;  Location: [facility] ENDOSCOPY;  Service: Gastroenterology;  Laterality: N/A;  . EYE SURGERY    . GALLBLADDER SURGERY  06/04/2014  . UPPER GASTROINTESTINAL ENDOSCOPY      Social History   Tobacco Use  . Smoking status: Former Research scientist (life sciences)  . Smokeless tobacco: Never Used  Vaping Use  . Vaping Use: Never used  Substance Use Topics  . Alcohol use: Yes    Alcohol/week: 1.0 standard drink    Types: 1 Cans of beer per week  . Drug use: No     Medication list has been reviewed and updated.  Current Meds  Medication Sig  . amoxicillin-clavulanate (AUGMENTIN) 875-125 MG tablet Take 1 tablet by mouth 2 (two) times daily.  Marland Kitchen Apremilast 30 MG TABS Take 1 tablet (30 mg total) by mouth 2 (two) times daily. Derm  . Calcium-Phosphorus-Vitamin D (CITRACAL +D3 PO) Take 2 tablets by mouth daily.  . Cholecalciferol (VITAMIN D) 2000 units tablet Take 2,000 Units by mouth daily.  Marland Kitchen co-enzyme Q-10 30 MG capsule Take 30 mg by mouth daily.  . DELZICOL 400 MG CPDR DR capsule Take 2 tablets by mouth 2 (two) times daily. GI Dr  . Dellie Catholic [facility]) 100-10 MG/5ML syrup Take 5 mLs by mouth 4 (four) times daily as needed for cough.  . lansoprazole (PREVACID) 30 MG capsule Take 1 capsule by mouth daily. GI Doc  . lisinopril-hydrochlorothiazide (ZESTORETIC) 10-12.5 MG tablet Take 1 tablet by mouth daily.  . meclizine (ANTIVERT) 25 MG tablet Take 1 tablet (25 mg total) by mouth 3 (three) times daily as needed for dizziness.  . nystatin-triamcinolone (MYCOLOG II) cream Apply 1 application topically 2 (two) times daily. PRN/ Derm  . rosuvastatin (CRESTOR) 20 MG tablet Take 1 tablet (20 mg total) by mouth daily.    PHQ 2/9 Scores 07/29/2020 04/30/2020 10/24/2019 10/23/2018  PHQ - 2 Score 0 0 0 0  PHQ- 9  Score 1 0 0 0    GAD 7 : Generalized Anxiety Score 07/29/2020 04/30/2020 10/24/2019  Nervous, Anxious, on Edge 0 0 0  Control/stop worrying 0 0 0  Worry too much - different things 0 0 0  Trouble relaxing 0 0 0  Restless 0 0 0  Easily annoyed or irritable 0 0 0  Afraid - awful might happen 0 0 0  Total GAD 7 Score 0 0 0    BP Readings from Last 3 Encounters:  08/06/20 130/72  07/29/20 124/80  04/30/20 120/80    Physical Exam Vitals and nursing note reviewed.  Musculoskeletal:     Left upper leg: Tenderness present.       Legs:     Comments: Palpable thrombosed vein right thigh/ extensive varicose veins     Wt Readings from Last 3 Encounters:  08/06/20 220 lb (99.8 kg)  07/29/20 218 lb (98.9 kg)  04/30/20 224 lb (101.6 kg)    BP 130/72   Pulse 76   Ht 5\' 6"  (1.676 m)   Wt 220 lb (99.8 kg)   BMI 35.51  kg/m   Assessment and Plan:  1. Thrombophlebitis Chronic.  Gradually worsening.  Stable.  Several years ago patient was seen by vein and vascular for which she had a procedure that sounds like of a sclerosing nature.  Over the time this has gradually returned to involve other veins as well patient has an area of the right upper thigh it is palpable of a seemingly thrombosed vein with some tenderness.  There is no written Creased swelling and no tenderness thereof.  This is consistent with a thrombophlebitis and patient has been instructed to use warm compresses and anti-inflammatories and we will refer to vein and vascular for evaluation and treatment. - Ambulatory referral to Vascular Surgery  2. Varicose veins of both lower extremities with pain Both legs have clusters of varicose veins with tenderness.  Which is consistent with incompetent varicose veins.  The will be referred to vascular for evaluation and treatment. - Ambulatory referral to Vascular Surgery

## 2020-08-06 NOTE — Patient Instructions (Signed)
Varicose Veins Varicose veins are veins that have become enlarged, bulged, and twisted. They most often appear in the legs. What are the causes? This condition is caused by damage to the valves in the vein. These valves help blood return to your heart. When they are damaged and they stop working properly, blood may flow backward and back up in the veins near the skin, causing the veins to get larger and appear twisted. The condition can result from any issue that causes blood to back up, like pregnancy, prolonged standing, or obesity. What increases the risk? This condition is more likely to develop in people who are:  On their feet a lot.  Pregnant.  Overweight. What are the signs or symptoms? Symptoms of this condition include:  Bulging, twisted, and bluish veins.  A feeling of heaviness. This may be worse at the end of the day.  Leg pain. This may be worse at the end of the day.  Swelling in the leg.  Changes in skin color over the veins. How is this diagnosed? This condition may be diagnosed based on your symptoms, a physical exam, and an ultrasound test. How is this treated? Treatment for this condition may involve:  Avoiding sitting or standing in one position for long periods of time.  Wearing compression stockings. These stockings help to prevent blood clots and reduce swelling in the legs.  Raising (elevating) the legs when resting.  Losing weight.  Exercising regularly. If you have persistent symptoms or want to improve the way your varicose veins look, you may choose to have a procedure to close the varicose veins off or to remove them. Treatments to close off the veins include:  Sclerotherapy. In this treatment, a solution is injected into a vein to close it off.  Laser treatment. In this treatment, the vein is heated with a laser to close it off.  Radiofrequency vein ablation. In this treatment, an electrical current produced by radio waves is used to close  off the vein. Treatments to remove the veins include:  Phlebectomy. In this treatment, the veins are removed through small incisions made over the veins.  Vein ligation and stripping. In this treatment, incisions are made over the veins. The veins are then removed after being tied (ligated) with stitches (sutures). Follow these instructions at home: Activity  Walk as much as possible. Walking increases blood flow. This helps blood return to the heart and takes pressure off your veins. It also increases your cardiovascular strength.  Follow your health care provider's instructions about exercising.  Do not stand or sit in one position for a long period of time.  Do not sit with your legs crossed.  Rest with your legs raised during the day. General instructions  Follow any diet instructions given to you by your health care provider.  Wear compression stockings as directed by your health care provider. Do not wear other kinds of tight clothing around your legs, pelvis, or waist.  Elevate your legs at night to above the level of your heart.  If you get a cut in the skin over the varicose vein and the vein bleeds: ? Lie down with your leg raised. ? Apply firm pressure to the cut with a clean cloth until the bleeding stops. ? Place a bandage (dressing) on the cut.   Contact a health care provider if:  The skin around your varicose veins starts to break down.  You have pain, redness, tenderness, or hard swelling over a vein.  You are uncomfortable because of pain.  You get a cut in the skin over a varicose vein and it will not stop bleeding. Summary  Varicose veins are veins that have become enlarged, bulged, and twisted. They most often appear in the legs.  This condition is caused by damage to the valves in the vein. These valves help blood return to your heart.  Treatment for this condition includes frequent movements, wearing compression stockings, losing weight, and  exercising regularly. In some cases, procedures are done to close off or remove the veins.  Treatment for this condition may include wearing compression stockings, elevating the legs, losing weight, and engaging in regular activity. In some cases, procedures are done to close off or remove the veins. This information is not intended to replace advice given to you by your health care provider. Make sure you discuss any questions you have with your health care provider. Document Revised: 11/01/2019 Document Reviewed: 11/01/2019 Elsevier Patient Education  2021 [place]. Thrombophlebitis Thrombophlebitis is a condition in which a blood clot forms in a vein. This can happen in your arms or legs, or in the area between your neck and groin (torso). When this condition happens in a vein that is close to the surface of the body (superficial thrombophlebitis), it is usually not serious.However, when the condition happens in a vein that is deep inside the body (deep vein thrombosis, DVT), it can cause serious problems. What are the causes? This condition may be caused by:  Damage to a vein.  Inflammation of the veins.  A condition that causes blood to clot more easily.  Reduced blood flow through the veins. What increases the risk? The following factors may make you more likely to develop this condition:  Having a condition that makes blood thicker or more likely to clot.  Having an infection.  Having major surgery.  Experiencing a traumatic injury or a broken bone.  Having a catheter in a vein (central line).  Having a condition in which valves in the veins do not work properly, causing blood to collect (pool) in the veins (chronic venous insufficiency).  An inactive (sedentary) lifestyle.  Pregnancy or having recently given birth.  Cancer.  Older age, especially being 67 or older.  Obesity.  Smoking.  Taking medicines that contain estrogen, such as birth control  pills.  Having varicose veins.  Using drugs that are injected into the veins (intravenous, IV). What are the signs or symptoms? The main symptoms of this condition are:  Swelling and pain in an arm or leg. If the affected vein is in the leg, you may feel pain while standing or walking.  Warmth or redness in an arm or leg. Other symptoms include:  Low-grade fever.  Muscle aches.  A bulging vein (venous distension). In some cases, there are no symptoms. How is this diagnosed? This condition may be diagnosed based on:  Your symptoms and medical history.  A physical exam.  Tests, such as: ? Blood tests. ? A test that uses sound waves to make images (ultrasound). How is this treated? Treatment depends on how severe the condition is and which area of the body is affected. Treatment may include:  Applying a warm compress or heating pad to affected areas.  Wearing compression stockings to help prevent blood clots and reduce swelling in your legs.  Raising (elevating) the affected arm or leg above the level of your heart.  Medicines, such as: ? Anti-inflammatory medicines, such as ibuprofen. ? Blood  thinners (anticoagulants), such as heparin. ? Antibiotic medicine, if you have an infection.  Removing an IV that may be causing the problem. In rare cases, surgery may be needed to:  Remove a damaged section of a vein.  Place a filter in a large vein to catch blood clots before they reach the lungs. Follow these instructions at home: Medicines  Take over-the-counter and prescription medicines only as told by your health care provider.  If you were prescribed an antibiotic, take it as told by your health care provider. Do not stop using the antibiotic even if you feel better. Managing pain, stiffness, and swelling  If directed, put heat on the affected area as often as told by your health care provider. Use the heat source that your health care provider recommends, such as  a moist heat pack or a heating pad. ? Place a towel between your skin and the heat source. ? Leave the heat on for 20-30 minutes. ? Remove the heat if your skin turns bright red. This is especially important if you are not able to feel pain, heat, or cold. You may have a greater risk of getting burned.  Elevate the affected area above the level of your heart while you are sitting or lying down.   Activity  Return to your normal activities as told by your health care provider. Ask your health care provider what activities are safe for you.  Avoid sitting or lying down for long periods. If possible, stand up and walk around regularly. If you are taking blood thinners:  Take your medicine exactly as told, at the same time every day.  Avoid activities that could cause injury or bruising, and follow instructions about how to prevent falls.  Wear a medical alert bracelet or carry a card that lists what medicines you take. General instructions  Drink enough fluid to keep your urine pale yellow.  Wear compression stockings as told by your health care provider.  Do not use any products that contain nicotine or tobacco, such as cigarettes and e-cigarettes. If you need help quitting, ask your health care provider.  Keep all follow-up visits as told by your health care provider. This is important. Contact a health care provider if:  You miss a dose of your blood thinner, if applicable.  Your symptoms do not improve.  You have unusual bruising.  You have nausea, vomiting, or diarrhea that lasts for more than one day. Get help right away if:  You have any of these problems: ? New or worse pain, swelling, or redness in an arm or leg. ? Numbness or tingling in an arm or leg. ? Shortness of breath. ? Chest pain. ? Severe pain in your abdomen. ? Fast breathing. ? A fast or irregular heartbeat. ? Blood in your vomit, stool, or urine. ? A severe headache or confusion. ? A cut that does  not stop bleeding.  You feel light-headed or dizzy.  You cough up blood.  You have a serious fall or accident, or you hit your head. These symptoms may represent a serious problem that is an emergency. Do not wait to see if the symptoms will go away. Get medical help right away. Call your local emergency services (911 in the [place] Do not drive yourself to the hospital. Summary  Thrombophlebitis is a condition in which a blood clot forms in a vein. This can happen in a vein close to the surface of the body or a vein deep inside the  body.  This condition can cause serious problems when it happens in a vein deep inside the body (deep vein thrombosis, DVT).  The main symptom of this condition is swelling and pain around the affected vein.  Treatment may include warm compresses, anti-inflammatory medicines, or blood thinners. This information is not intended to replace advice given to you by your health care provider. Make sure you discuss any questions you have with your health care provider. Document Revised: 11/12/2019 Document Reviewed: 11/12/2019 Elsevier Patient Education  [place].

## 2020-08-07 DIAGNOSIS — M8580 Other specified disorders of bone density and structure, unspecified site: Secondary | ICD-10-CM | POA: Insufficient documentation

## 2020-08-18 ENCOUNTER — Ambulatory Visit (INDEPENDENT_AMBULATORY_CARE_PROVIDER_SITE_OTHER): Payer: Medicare Other | Admitting: Dermatology

## 2020-08-18 ENCOUNTER — Other Ambulatory Visit: Payer: Self-pay

## 2020-08-18 DIAGNOSIS — Z86018 Personal history of other benign neoplasm: Secondary | ICD-10-CM | POA: Diagnosis not present

## 2020-08-18 DIAGNOSIS — B079 Viral wart, unspecified: Secondary | ICD-10-CM | POA: Diagnosis not present

## 2020-08-18 DIAGNOSIS — L820 Inflamed seborrheic keratosis: Secondary | ICD-10-CM | POA: Diagnosis not present

## 2020-08-18 DIAGNOSIS — L853 Xerosis cutis: Secondary | ICD-10-CM

## 2020-08-18 DIAGNOSIS — L409 Psoriasis, unspecified: Secondary | ICD-10-CM | POA: Diagnosis not present

## 2020-08-18 MED ORDER — TRIAMCINOLONE ACETONIDE 0.1 % EX CREA: 1.0000 "application " | TOPICAL_CREAM | CUTANEOUS | 1 refills | Status: DC

## 2020-08-18 MED ORDER — OTEZLA 30 MG PO TABS: 30.0000 mg | ORAL_TABLET | Freq: Two times a day (BID) | ORAL | 5 refills | Status: DC

## 2020-08-18 NOTE — Progress Notes (Signed)
   Follow-Up Visit   Subjective  Theresa Gross is a 65 y.o. female who presents for the following: Psoriasis (Scalp, elbows, Otezla 30mg  1 po bid, no s/e, Taclonex sol prn) and Pruritis (back).  Doing well.  Small area still present in scalp which she treats with Taclonex.  Her fingernails are much better.  She wants to continue [place].  She also has an itchy growth on back she would like removed.  She had warts treated on her fingers which have cleared up.   The following portions of the chart were reviewed this encounter and updated as appropriate:       Review of Systems:  No other skin or systemic complaints except as noted in HPI or Assessment and Plan.  Objective  Well appearing patient in no apparent distress; mood and affect are within normal limits.  A focused examination was performed including scalp, elbows. Relevant physical exam findings are noted in the Assessment and Plan.  Objective  Scalp, elbows: Pink scaly patch R occipital scalp, mild erythema and scale bil elbows, fingernails clear  Objective  Mid Back: Xerosis back  Objective  Left Upper Back x 1: Erythematous keratotic or waxy stuck-on papule  Objective  L 3rd and L 4th finger: Fingers clear  Objective  Right back: Linear scar with no evidence of recurrence.    Assessment & Plan     Psoriasis Scalp, elbows  Chronic condition, improving on Otezla, but not clear  Psoriasis is a chronic non-curable, but treatable genetic/hereditary disease that may have other systemic features affecting other organ systems such as joints (Psoriatic Arthritis). It is associated with an increased risk of inflammatory bowel disease, heart disease, non-alcoholic fatty liver disease, and depression.     Cont Otezla 30mg  1 po bid Cont Taclonex susp qd prn flares  Side effects of Otezla (apremilast) include diarrhea, nausea, headache, upper respiratory infection, depression, and weight decrease (5-10%). It should  only be taken by pregnant women after a discussion regarding risks and benefits with their doctor. Goal is control of skin condition, not cure.  The use of [place] Nail requires long term medication management, including periodic office visits.   Apremilast (OTEZLA) 30 MG TABS - Scalp, elbows  Xerosis cutis Mid Back  With Pruritus  Start TMC 0.1% cr qd/bid until clear then prn flares, avoid f/g/a  Recommend mild soap and moisturizing cream 1-2 times daily.    triamcinolone (KENALOG) 0.1 % - Mid Back  Inflamed seborrheic keratosis Left Upper Back x 1  Destruction of lesion - Left Upper Back x 1  Destruction method: cryotherapy   Informed consent: discussed and consent obtained   Lesion destroyed using liquid nitrogen: Yes   Region frozen until ice ball extended beyond lesion: Yes   Outcome: patient tolerated procedure well with no complications   Post-procedure details: wound care instructions given    Viral warts, unspecified type L 3rd and L 4th finger  Hx of Warts, resolved with LN2 treatments  History of dysplastic nevus Right back  Clear. Observe for recurrence. Call clinic for new or changing lesions.  Recommend regular skin exams, daily broad-spectrum spf 30+ sunscreen use, and photoprotection.     Return in about 6 months (around 02/15/2021) for Psoriasis f/u.  I, Othelia Pulling, RMA, am acting as scribe for [name], MD . Documentation: I have reviewed the above documentation for accuracy and completeness, and I agree with the above.  [name] MD

## 2020-08-20 ENCOUNTER — Other Ambulatory Visit (INDEPENDENT_AMBULATORY_CARE_PROVIDER_SITE_OTHER): Payer: Self-pay | Admitting: Nurse Practitioner

## 2020-08-20 ENCOUNTER — Ambulatory Visit (INDEPENDENT_AMBULATORY_CARE_PROVIDER_SITE_OTHER): Payer: Medicare Other

## 2020-08-20 DIAGNOSIS — Z1231 Encounter for screening mammogram for malignant neoplasm of breast: Secondary | ICD-10-CM | POA: Diagnosis not present

## 2020-08-20 DIAGNOSIS — I809 Phlebitis and thrombophlebitis of unspecified site: Secondary | ICD-10-CM

## 2020-08-20 DIAGNOSIS — I83819 Varicose veins of unspecified lower extremities with pain: Secondary | ICD-10-CM

## 2020-08-20 DIAGNOSIS — Z0000 Encounter for general adult medical examination without abnormal findings: Secondary | ICD-10-CM | POA: Diagnosis not present

## 2020-08-20 NOTE — Progress Notes (Signed)
Subjective:   Theresa Gross is a 65 y.o. female who presents for an Initial Medicare Annual Wellness Visit.  Virtual Visit via Telephone Note  I connected with  [name] on 08/20/20 at  8:00 AM EST by telephone and verified that I am speaking with the correct person using two identifiers.  Location: Patient: home Provider: [facility] Persons participating in the virtual visit: [place]   I discussed the limitations, risks, security and privacy concerns of performing an evaluation and management service by telephone and the availability of in person appointments. The patient expressed understanding and agreed to proceed.  Interactive audio and video telecommunications were attempted between this nurse and patient, however failed, due to patient having technical difficulties OR patient did not have access to video capability.  We continued and completed visit with audio only.  Some vital signs may be absent or patient reported.   Clemetine Marker, LPN    Review of Systems     Cardiac Risk Factors include: advanced age (>86men, >25 women);dyslipidemia;hypertension;obesity (BMI >30kg/m2)     Objective:    There were no vitals filed for this visit. There is no height or weight on file to calculate BMI.  Advanced Directives 08/20/2020 12/17/2019 02/21/2018 06/16/2016  Does Patient Have a Medical Advance Directive? Yes Yes Yes Yes  Type of Paramedic of [place];Living will [place];Living will [place];Living will Out of facility DNR (pink MOST or yellow form)  Copy of [place] in Chart? No - copy requested Yes - validated most recent copy scanned in chart (See row information) - -    Current Medications (verified) Outpatient Encounter Medications as of 08/20/2020  Medication Sig  . Apremilast (OTEZLA) 30 MG TABS Take 1 tablet (30 mg total) by mouth 2 (two) times daily.  .  Calcium-Phosphorus-Vitamin D (CITRACAL +D3 PO) Take 2 tablets by mouth daily.  . Cholecalciferol (VITAMIN D) 2000 units tablet Take 2,000 Units by mouth daily.  Marland Kitchen co-enzyme Q-10 30 MG capsule Take 30 mg by mouth daily.  . DELZICOL 400 MG CPDR DR capsule Take 2 tablets by mouth 2 (two) times daily. GI Dr  . lansoprazole (PREVACID) 30 MG capsule Take 1 capsule by mouth daily. GI Doc  . lisinopril-hydrochlorothiazide (ZESTORETIC) 10-12.5 MG tablet Take 1 tablet by mouth daily.  . meclizine (ANTIVERT) 25 MG tablet Take 1 tablet (25 mg total) by mouth 3 (three) times daily as needed for dizziness.  . Multiple Vitamins-Minerals (MULTIVITAMIN WOMEN) TABS Take by mouth.  . nystatin-triamcinolone (MYCOLOG II) cream Apply 1 application topically 2 (two) times daily. PRN/ Derm  . rosuvastatin (CRESTOR) 20 MG tablet Take 1 tablet (20 mg total) by mouth daily.  Marland Kitchen triamcinolone (KENALOG) 0.1 % Apply 1 application topically as directed. Qd to bid aa itching on back until clear, avoid face, groin, axilla  . [DISCONTINUED] amoxicillin-clavulanate (AUGMENTIN) 875-125 MG tablet Take 1 tablet by mouth 2 (two) times daily.  . [DISCONTINUED] Apremilast 30 MG TABS Take 1 tablet (30 mg total) by mouth 2 (two) times daily. Derm  . [DISCONTINUED] guaiFENesin-codeine (ROBITUSSIN AC) 100-10 MG/5ML syrup Take 5 mLs by mouth 4 (four) times daily as needed for cough.   No facility-administered encounter medications on file as of 08/20/2020.    Allergies (verified) Ciprofloxacin, Lipitor [atorvastatin], Metronidazole, and Nsaids   History: Past Medical History:  Diagnosis Date  . Cervical cancer [facility])    age 64  . Diverticulosis   .  Dysplastic nevus 11/29/2007   Right back. Severe atypia, extends to margin. Excised: 12/22/2007, margins free.  Marland Kitchen GERD (gastroesophageal reflux disease)   . Hemorrhoids   . History of colon polyps   . Hyperlipemia   . Hypertension   . Left sided colitis ([place])   . Psoriasis    Taking  Otezla  . Vitamin D deficiency    Past Surgical History:  Procedure Laterality Date  . ABDOMINAL HYSTERECTOMY    . BREAST BIOPSY Right 10 plus yrs ago   stereo bx./clip. Benign  . CHOLECYSTECTOMY    . COLONOSCOPY WITH PROPOFOL N/A 06/16/2016   Procedure: COLONOSCOPY WITH PROPOFOL;  Surgeon: Manya Silvas, MD;  Location: [facility] ENDOSCOPY;  Service: Endoscopy;  Laterality: N/A;  . COLONOSCOPY WITH PROPOFOL N/A 12/17/2019   Procedure: COLONOSCOPY WITH PROPOFOL;  Surgeon: Toledo, Benay Pike, MD;  Location: [facility] ENDOSCOPY;  Service: Gastroenterology;  Laterality: N/A;  . DIAGNOSTIC LAPAROSCOPY    . ESOPHAGOGASTRODUODENOSCOPY (EGD) WITH PROPOFOL N/A 12/17/2019   Procedure: ESOPHAGOGASTRODUODENOSCOPY (EGD) WITH PROPOFOL;  Surgeon: Toledo, Benay Pike, MD;  Location: [facility] ENDOSCOPY;  Service: Gastroenterology;  Laterality: N/A;  . EYE SURGERY    . GALLBLADDER SURGERY  06/04/2014  . UPPER GASTROINTESTINAL ENDOSCOPY     Family History  Problem Relation Age of Onset  . Colon cancer Mother   . Diabetes Father   . Breast cancer Neg Hx    Social History   Socioeconomic History  . Marital status: Married    Spouse name: Not on file  . Number of children: 1  . Years of education: Not on file  . Highest education level: Not on file  Occupational History  . Occupation: retired  Tobacco Use  . Smoking status: Former Research scientist (life sciences)  . Smokeless tobacco: Never Used  Vaping Use  . Vaping Use: Never used  Substance and Sexual Activity  . Alcohol use: Yes    Alcohol/week: 1.0 standard drink    Types: 1 Cans of beer per week  . Drug use: No  . Sexual activity: Yes    Birth control/protection: Surgical  Other Topics Concern  . Not on file  Social History Narrative  . Not on file   Social Determinants of Health   Financial Resource Strain: Low Risk   . Difficulty of Paying Living Expenses: Not hard at all  Food Insecurity: No Food Insecurity  . Worried About Charity fundraiser in the Last Year:  Never true  . Ran Out of Food in the Last Year: Never true  Transportation Needs: No Transportation Needs  . Lack of Transportation (Medical): No  . Lack of Transportation (Non-Medical): No  Physical Activity: Inactive  . Days of Exercise per Week: 0 days  . Minutes of Exercise per Session: 0 min  Stress: No Stress Concern Present  . Feeling of Stress : Not at all  Social Connections: Moderately Integrated  . Frequency of Communication with Friends and Family: More than three times a week  . Frequency of Social Gatherings with Friends and Family: More than three times a week  . Attends Religious Services: More than 4 times per year  . Active Member of Clubs or Organizations: No  . Attends Archivist Meetings: Never  . Marital Status: Married    Tobacco Counseling Counseling given: Not Answered   Clinical Intake:  Pre-visit preparation completed: Yes  Pain : No/denies pain     Nutritional Risks: None Diabetes: No  How often do you need to have someone  help you when you read instructions, pamphlets, or other written materials from your doctor or pharmacy?: 1 - Never    Interpreter Needed?: No  Information entered by :: Clemetine Marker LPN   Activities of Daily Living In your present state of health, do you have any difficulty performing the following activities: 08/20/2020 07/29/2020  Hearing? N N  Comment declines hearing aids -  Vision? N N  Difficulty concentrating or making decisions? N N  Walking or climbing stairs? N N  Dressing or bathing? N N  Doing errands, shopping? N N  Preparing Food and eating ? N -  Using the Toilet? N -  In the past six months, have you accidently leaked urine? N -  Do you have problems with loss of bowel control? N -  Managing your Medications? N -  Managing your Finances? N -  Housekeeping or managing your Housekeeping? N -  Some recent data might be hidden    Patient Care Team: Juline Patch, MD as PCP - General  (Family Medicine)  Indicate any recent Medical Services you may have received from other than [facility] providers in the past year (date may be approximate).     Assessment:   This is a routine wellness examination for [place].  Hearing/Vision screen  Hearing Screening   125Hz  250Hz  500Hz  1000Hz  2000Hz  3000Hz  4000Hz  6000Hz  8000Hz   Right ear:           Left ear:           Comments: Pt denies hearing difficulty  Vision Screening Comments: Annual vision screenings [facility] Dr. Wallace Going  Dietary issues and exercise activities discussed: Current Exercise Habits: The patient does not participate in regular exercise at present, Exercise limited by: None identified  Goals    . DIET - INCREASE WATER INTAKE     Recommend drinking 6-8 glasses of water per day       Depression Screen PHQ 2/9 Scores 08/20/2020 07/29/2020 04/30/2020 10/24/2019 10/23/2018 04/17/2018 02/16/2017  PHQ - 2 Score 0 0 0 0 0 0 0  PHQ- 9 Score - 1 0 0 0 0 0    Fall Risk Fall Risk  08/20/2020 07/29/2020 04/30/2020 10/24/2019 11/19/2015  Falls in the past year? 0 0 0 0 Yes  Number falls in past yr: 0 - - - 1  Injury with Fall? 0 - - - Yes  Comment - - - - turned ankle  Risk for fall due to : No Fall Risks - - - -  Follow up Falls prevention discussed Falls evaluation completed Falls evaluation completed Falls evaluation completed Falls evaluation completed    [place]:  Any stairs in or around the home? Yes  If so, are there any without handrails? No  Home free of loose throw rugs in walkways, pet beds, electrical cords, etc? Yes  Adequate lighting in your home to reduce risk of falls? Yes   ASSISTIVE DEVICES UTILIZED TO PREVENT FALLS:  Life alert? No  Use of a cane, walker or w/c? No  Grab bars in the bathroom? Yes  Shower chair or bench in shower? Yes  Elevated toilet seat or a handicapped toilet? No  TIMED UP AND GO:  Was the test performed? No .  Telephonic visit.   Cognitive Function: Normal cognitive status assessed by direct observation by this Nurse Health Advisor. No abnormalities found.          Immunizations Immunization History  Administered  Date(s) Administered  . Fluad Quad(high Dose 65+) 04/25/2019  . Influenza,inj,Quad PF,6+ Mos 02/16/2017, 04/17/2018  . Influenza-Unspecified 04/18/2014, 04/07/2020  . PFIZER(Purple Top)SARS-COV-2 Vaccination 08/25/2019, 09/15/2019, 06/26/2020  . Zoster Recombinat (Shingrix) 04/07/2020    TDAP status: Due, Education has been provided regarding the importance of this vaccine. Advised may receive this vaccine at local pharmacy or Health Dept. Aware to provide a copy of the vaccination record if obtained from local pharmacy or Health Dept. Verbalized acceptance and understanding.  Flu Vaccine status: Up to date  Pneumococcal vaccine status: Declined,  Education has been provided regarding the importance of this vaccine but patient still declined. Advised may receive this vaccine at local pharmacy or Health Dept. Aware to provide a copy of the vaccination record if obtained from local pharmacy or Health Dept. Verbalized acceptance and understanding.   Covid-19 vaccine status: Completed vaccines  Qualifies for Shingles Vaccine? Yes   Zostavax completed No   Shingrix Completed?: Yes  Screening Tests Health Maintenance  Topic Date Due  . PNA vac Low Risk Adult (1 of 2 - PCV13) Never done  . COVID-19 Vaccine (4 - Booster for Pfizer series) 12/25/2020  . MAMMOGRAM  01/15/2021  . COLONOSCOPY (Pts 45-26yrs Insurance coverage will need to be confirmed)  12/16/2024  . TETANUS/TDAP  05/05/2026  . INFLUENZA VACCINE  Completed  . DEXA SCAN  Completed  . Hepatitis C Screening  Completed    Health Maintenance  Health Maintenance Due  Topic Date Due  . PNA vac Low Risk Adult (1 of 2 - PCV13) Never done    Colorectal cancer screening: Colonoscopy screening completed 12/17/19 Repeat  every 3-5 years  Mammogram status: Completed 01/16/19. Repeat every year. Ordered today.  Bone Density status: Completed 07/14/20. Results reflect: Bone density results: OSTEOPENIA. Repeat every 2 years.  Lung Cancer Screening: (Low Dose CT Chest recommended if Age 16-80 years, 30 pack-year currently smoking OR have quit w/in 15years.) does not qualify.   Additional Screening:  Hepatitis C Screening: does qualify; Completed 05/25/16  Vision Screening: Recommended annual ophthalmology exams for early detection of glaucoma and other disorders of the eye. Is the patient up to date with their annual eye exam?  Yes  Who is the provider or what is the name of the office in which the patient attends annual eye exams? [place] Screening: Recommended annual dental exams for proper oral hygiene  Community Resource Referral / Chronic Care Management: CRR required this visit?  No   CCM required this visit?  No      Plan:     I have personally reviewed and noted the following in the patient's chart:   . Medical and social history . Use of alcohol, tobacco or illicit drugs  . Current medications and supplements . Functional ability and status . Nutritional status . Physical activity . Advanced directives . List of other physicians . Hospitalizations, surgeries, and ER visits in previous 12 months . Vitals . Screenings to include cognitive, depression, and falls . Referrals and appointments  In addition, I have reviewed and discussed with patient certain preventive protocols, quality metrics, and best practice recommendations. A written personalized care plan for preventive services as well as general preventive health recommendations were provided to patient.     Clemetine Marker, LPN   3/00/7622   Nurse Notes: none

## 2020-08-20 NOTE — Patient Instructions (Signed)
Theresa Gross , Thank you for taking time to come for your Medicare Wellness Visit. I appreciate your ongoing commitment to your health goals. Please review the following plan we discussed and let me know if I can assist you in the future.   Screening recommendations/referrals: Colonoscopy: done 12/17/19.   Mammogram: done 01/16/19. Please call [phone] to schedule your mammogram.  Bone Density: done 07/14/20 Recommended yearly ophthalmology/optometry visit for glaucoma screening and checkup Recommended yearly dental visit for hygiene and checkup  Vaccinations: Influenza vaccine: done 04/07/20 Pneumococcal vaccine: due Tdap vaccine: due Shingles vaccine: done 04/07/20; we will contact [facility] for second vaccination date   Covid-19:done 08/25/19, 09/15/19 & 06/26/20  Advanced directives: Please bring a copy of your health care power of attorney and living will to the office at your convenience.  Conditions/risks identified: Recommend increasing physical activity to at least 3 days per week   Next appointment: Follow up in one year for your annual wellness visit    Preventive Care 65 Years and Older, Female Preventive care refers to lifestyle choices and visits with your health care provider that can promote health and wellness. What does preventive care include?  A yearly physical exam. This is also called an annual well check.  Dental exams once or twice a year.  Routine eye exams. Ask your health care provider how often you should have your eyes checked.  Personal lifestyle choices, including:  Daily care of your teeth and gums.  Regular physical activity.  Eating a healthy diet.  Avoiding tobacco and drug use.  Limiting alcohol use.  Practicing safe sex.  Taking low-dose aspirin every day.  Taking vitamin and mineral supplements as recommended by your health care provider. What happens during an annual well check? The services and screenings done by your health  care provider during your annual well check will depend on your age, overall health, lifestyle risk factors, and family history of disease. Counseling  Your health care provider may ask you questions about your:  Alcohol use.  Tobacco use.  Drug use.  Emotional well-being.  Home and relationship well-being.  Sexual activity.  Eating habits.  History of falls.  Memory and ability to understand (cognition).  Work and work Statistician.  Reproductive health. Screening  You may have the following tests or measurements:  Height, weight, and BMI.  Blood pressure.  Lipid and cholesterol levels. These may be checked every 5 years, or more frequently if you are over 81 years old.  Skin check.  Lung cancer screening. You may have this screening every year starting at age 63 if you have a 30-pack-year history of smoking and currently smoke or have quit within the past 15 years.  Fecal occult blood test (FOBT) of the stool. You may have this test every year starting at age 60.  Flexible sigmoidoscopy or colonoscopy. You may have a sigmoidoscopy every 5 years or a colonoscopy every 10 years starting at age 76.  Hepatitis C blood test.  Hepatitis B blood test.  Sexually transmitted disease (STD) testing.  Diabetes screening. This is done by checking your blood sugar (glucose) after you have not eaten for a while (fasting). You may have this done every 1-3 years.  Bone density scan. This is done to screen for osteoporosis. You may have this done starting at age 76.  Mammogram. This may be done every 1-2 years. Talk to your health care provider about how often you should have regular mammograms. Talk with your health care  provider about your test results, treatment options, and if necessary, the need for more tests. Vaccines  Your health care provider may recommend certain vaccines, such as:  Influenza vaccine. This is recommended every year.  Tetanus, diphtheria, and  acellular pertussis (Tdap, Td) vaccine. You may need a Td booster every 10 years.  Zoster vaccine. You may need this after age 48.  Pneumococcal 13-valent conjugate (PCV13) vaccine. One dose is recommended after age 25.  Pneumococcal polysaccharide (PPSV23) vaccine. One dose is recommended after age 28. Talk to your health care provider about which screenings and vaccines you need and how often you need them. This information is not intended to replace advice given to you by your health care provider. Make sure you discuss any questions you have with your health care provider. Document Released: 07/18/2015 Document Revised: 03/10/2016 Document Reviewed: 04/22/2015 Elsevier Interactive Patient Education  2017 [place] Prevention in the Home Falls can cause injuries. They can happen to people of all ages. There are many things you can do to make your home safe and to help prevent falls. What can I do on the outside of my home?  Regularly fix the edges of walkways and driveways and fix any cracks.  Remove anything that might make you trip as you walk through a door, such as a raised step or threshold.  Trim any bushes or trees on the path to your home.  Use bright outdoor lighting.  Clear any walking paths of anything that might make someone trip, such as rocks or tools.  Regularly check to see if handrails are loose or broken. Make sure that both sides of any steps have handrails.  Any raised decks and porches should have guardrails on the edges.  Have any leaves, snow, or ice cleared regularly.  Use sand or salt on walking paths during winter.  Clean up any spills in your garage right away. This includes oil or grease spills. What can I do in the bathroom?  Use night lights.  Install grab bars by the toilet and in the tub and shower. Do not use towel bars as grab bars.  Use non-skid mats or decals in the tub or shower.  If you need to sit down in the shower, use  a plastic, non-slip stool.  Keep the floor dry. Clean up any water that spills on the floor as soon as it happens.  Remove soap buildup in the tub or shower regularly.  Attach bath mats securely with double-sided non-slip rug tape.  Do not have throw rugs and other things on the floor that can make you trip. What can I do in the bedroom?  Use night lights.  Make sure that you have a light by your bed that is easy to reach.  Do not use any sheets or blankets that are too big for your bed. They should not hang down onto the floor.  Have a firm chair that has side arms. You can use this for support while you get dressed.  Do not have throw rugs and other things on the floor that can make you trip. What can I do in the kitchen?  Clean up any spills right away.  Avoid walking on wet floors.  Keep items that you use a lot in easy-to-reach places.  If you need to reach something above you, use a strong step stool that has a grab bar.  Keep electrical cords out of the way.  Do not use floor polish  or wax that makes floors slippery. If you must use wax, use non-skid floor wax.  Do not have throw rugs and other things on the floor that can make you trip. What can I do with my stairs?  Do not leave any items on the stairs.  Make sure that there are handrails on both sides of the stairs and use them. Fix handrails that are broken or loose. Make sure that handrails are as long as the stairways.  Check any carpeting to make sure that it is firmly attached to the stairs. Fix any carpet that is loose or worn.  Avoid having throw rugs at the top or bottom of the stairs. If you do have throw rugs, attach them to the floor with carpet tape.  Make sure that you have a light switch at the top of the stairs and the bottom of the stairs. If you do not have them, ask someone to add them for you. What else can I do to help prevent falls?  Wear shoes that:  Do not have high heels.  Have  rubber bottoms.  Are comfortable and fit you well.  Are closed at the toe. Do not wear sandals.  If you use a stepladder:  Make sure that it is fully opened. Do not climb a closed stepladder.  Make sure that both sides of the stepladder are locked into place.  Ask someone to hold it for you, if possible.  Clearly mark and make sure that you can see:  Any grab bars or handrails.  First and last steps.  Where the edge of each step is.  Use tools that help you move around (mobility aids) if they are needed. These include:  Canes.  Walkers.  Scooters.  Crutches.  Turn on the lights when you go into a dark area. Replace any light bulbs as soon as they burn out.  Set up your furniture so you have a clear path. Avoid moving your furniture around.  If any of your floors are uneven, fix them.  If there are any pets around you, be aware of where they are.  Review your medicines with your doctor. Some medicines can make you feel dizzy. This can increase your chance of falling. Ask your doctor what other things that you can do to help prevent falls. This information is not intended to replace advice given to you by your health care provider. Make sure you discuss any questions you have with your health care provider. Document Released: 04/17/2009 Document Revised: 11/27/2015 Document Reviewed: 07/26/2014 Elsevier Interactive Patient Education  2017 [employer].

## 2020-08-21 ENCOUNTER — Ambulatory Visit (INDEPENDENT_AMBULATORY_CARE_PROVIDER_SITE_OTHER): Payer: Medicare Other | Admitting: Nurse Practitioner

## 2020-08-21 ENCOUNTER — Other Ambulatory Visit: Payer: Self-pay

## 2020-08-21 ENCOUNTER — Ambulatory Visit (INDEPENDENT_AMBULATORY_CARE_PROVIDER_SITE_OTHER): Payer: Medicare Other

## 2020-08-21 ENCOUNTER — Encounter (INDEPENDENT_AMBULATORY_CARE_PROVIDER_SITE_OTHER): Payer: Self-pay | Admitting: Nurse Practitioner

## 2020-08-21 VITALS — BP 127/81 | HR 68 | Resp 16 | Ht 65.0 in | Wt 217.0 lb

## 2020-08-21 DIAGNOSIS — I83813 Varicose veins of bilateral lower extremities with pain: Secondary | ICD-10-CM

## 2020-08-21 DIAGNOSIS — I809 Phlebitis and thrombophlebitis of unspecified site: Secondary | ICD-10-CM

## 2020-08-21 DIAGNOSIS — I83819 Varicose veins of unspecified lower extremities with pain: Secondary | ICD-10-CM

## 2020-08-21 DIAGNOSIS — I10 Essential (primary) hypertension: Secondary | ICD-10-CM | POA: Diagnosis not present

## 2020-08-22 ENCOUNTER — Encounter (INDEPENDENT_AMBULATORY_CARE_PROVIDER_SITE_OTHER): Payer: Self-pay | Admitting: Nurse Practitioner

## 2020-08-22 ENCOUNTER — Encounter (INDEPENDENT_AMBULATORY_CARE_PROVIDER_SITE_OTHER): Payer: Self-pay

## 2020-08-24 ENCOUNTER — Encounter (INDEPENDENT_AMBULATORY_CARE_PROVIDER_SITE_OTHER): Payer: Self-pay | Admitting: Nurse Practitioner

## 2020-08-24 NOTE — Progress Notes (Signed)
Subjective:    Patient ID: Theresa Gross, female    DOB: 12-05-1955, 65 y.o.   MRN: [id number] Chief Complaint  Patient presents with  . Follow-up    ultrasound    The patient is seen for evaluation of symptomatic varicose veins. The patient relates burning and stinging which worsened steadily throughout the course of the day, particularly with standing. The patient also notes an aching and throbbing pain over the varicosities, particularly with prolonged dependent positions. The symptoms are significantly improved with elevation.  The patient also notes that during hot weather the symptoms are greatly intensified. The patient states the pain from the varicose veins interferes with work, daily exercise, shopping and household maintenance. At this point, the symptoms are persistent and severe enough that they're having a negative impact on lifestyle and are interfering with daily activities.  There is no history of DVT, PE or superficial thrombophlebitis. There is no history of ulceration or hemorrhage. The patient denies a significant family history of varicose veins.   The patient has worn graduated compression in the past. At the present time the patient has been using over-the-counter analgesics. There is a history of prior surgical intervention or sclerotherapy in the left GSV  The patient has evidence of reflux in the right great saphenous vein at the proximal thigh.  The patient also has evidence of reflux in the left lower extremity in the great saphenous vein at the saphenofemoral junction as well as in the accessory saphenous vein.  There is previous evidence of ablation of the left lower extremity.  The patient has deep venous insufficiency in the left lower extremity.  There is no evidence of DVT or superficial thrombophlebitis seen bilaterally.    Review of Systems  Cardiovascular: Positive for leg swelling.  Musculoskeletal:       Tenderness over the varicosities  All other  systems reviewed and are negative.      Objective:   Physical Exam Vitals reviewed.  HENT:     Head: Normocephalic.  Cardiovascular:     Rate and Rhythm: Normal rate.     Pulses: Normal pulses.  Pulmonary:     Effort: Pulmonary effort is normal.  Musculoskeletal:        General: Tenderness present.  Neurological:     Mental Status: She is alert and oriented to person, place, and time.  Psychiatric:        Mood and Affect: Mood normal.        Behavior: Behavior normal.        Thought Content: Thought content normal.        Judgment: Judgment normal.     BP 127/81 (BP Location: Right Arm)   Pulse 68   Resp 16   Ht 5\' 5"  (1.651 m)   Wt 217 lb (98.4 kg)   BMI 36.11 kg/m   Past Medical History:  Diagnosis Date  . Cervical cancer [facility])    age 65  . Diverticulosis   . Dysplastic nevus 11/29/2007   Right back. Severe atypia, extends to margin. Excised: 12/22/2007, margins free.  Marland Kitchen GERD (gastroesophageal reflux disease)   . Hemorrhoids   . History of colon polyps   . Hyperlipemia   . Hypertension   . Left sided colitis ([place])   . Psoriasis    Taking Otezla  . Vitamin D deficiency     Social History   Socioeconomic History  . Marital status: Married    Spouse name: Not on file  .  Number of children: 1  . Years of education: Not on file  . Highest education level: Not on file  Occupational History  . Occupation: retired  Tobacco Use  . Smoking status: Former Research scientist (life sciences)  . Smokeless tobacco: Never Used  Vaping Use  . Vaping Use: Never used  Substance and Sexual Activity  . Alcohol use: Yes    Alcohol/week: 1.0 standard drink    Types: 1 Cans of beer per week  . Drug use: No  . Sexual activity: Yes    Birth control/protection: Surgical  Other Topics Concern  . Not on file  Social History Narrative  . Not on file   Social Determinants of Health   Financial Resource Strain: Low Risk   . Difficulty of Paying Living Expenses: Not hard at all  Food  Insecurity: No Food Insecurity  . Worried About Charity fundraiser in the Last Year: Never true  . Ran Out of Food in the Last Year: Never true  Transportation Needs: No Transportation Needs  . Lack of Transportation (Medical): No  . Lack of Transportation (Non-Medical): No  Physical Activity: Inactive  . Days of Exercise per Week: 0 days  . Minutes of Exercise per Session: 0 min  Stress: No Stress Concern Present  . Feeling of Stress : Not at all  Social Connections: Moderately Integrated  . Frequency of Communication with Friends and Family: More than three times a week  . Frequency of Social Gatherings with Friends and Family: More than three times a week  . Attends Religious Services: More than 4 times per year  . Active Member of Clubs or Organizations: No  . Attends Archivist Meetings: Never  . Marital Status: Married  Human resources officer Violence: Not At Risk  . Fear of Current or Ex-Partner: No  . Emotionally Abused: No  . Physically Abused: No  . Sexually Abused: No    Past Surgical History:  Procedure Laterality Date  . ABDOMINAL HYSTERECTOMY    . BREAST BIOPSY Right 10 plus yrs ago   stereo bx./clip. Benign  . CHOLECYSTECTOMY    . COLONOSCOPY WITH PROPOFOL N/A 06/16/2016   Procedure: COLONOSCOPY WITH PROPOFOL;  Surgeon: Manya Silvas, MD;  Location[facility] ENDOSCOPY;  Service: Endoscopy;  Laterality: N/A;  . COLONOSCOPY WITH PROPOFOL N/A 12/17/2019   Procedure: COLONOSCOPY WITH PROPOFOL;  Surgeon: Toledo, Benay Pike, MD;  Location: [facility] ENDOSCOPY;  Service: Gastroenterology;  Laterality: N/A;  . DIAGNOSTIC LAPAROSCOPY    . ESOPHAGOGASTRODUODENOSCOPY (EGD) WITH PROPOFOL N/A 12/17/2019   Procedure: ESOPHAGOGASTRODUODENOSCOPY (EGD) WITH PROPOFOL;  Surgeon: Toledo, Benay Pike, MD;  Location: [facility] ENDOSCOPY;  Service: Gastroenterology;  Laterality: N/A;  . EYE SURGERY    . GALLBLADDER SURGERY  06/04/2014  . UPPER GASTROINTESTINAL ENDOSCOPY      Family History   Problem Relation Age of Onset  . Colon cancer Mother   . Diabetes Father   . Breast cancer Neg Hx     Allergies  Allergen Reactions  . Ciprofloxacin Nausea And Vomiting  . Lipitor [Atorvastatin] Other (See Comments)    Body Aches  . Metronidazole Nausea And Vomiting  . Nsaids Other (See Comments)    Have to be careful due to liver    CBC Latest Ref Rng & Units 05/17/2013  WBC 3.6 - 11.0 x10 3/mm 3 6.9  Hemoglobin 12.0 - 16.0 g/dL 13.7  Hematocrit 35.0 - 47.0 % 39.4  Platelets 150 - 440 x10 3/mm 3 241      CMP  Component Value Date/Time   NA 140 06/13/2019 1123   NA 137 05/17/2013 1228   K 4.4 06/13/2019 1123   K 3.8 05/17/2013 1228   CL 104 06/13/2019 1123   CL 105 05/17/2013 1228   CO2 23 06/13/2019 1123   CO2 29 05/17/2013 1228   GLUCOSE 90 06/13/2019 1123   GLUCOSE 122 (H) 02/21/2018 1331   GLUCOSE 86 05/17/2013 1228   BUN 10 06/13/2019 1123   BUN 17 05/17/2013 1228   CREATININE 0.71 06/13/2019 1123   CREATININE 0.89 05/17/2013 1228   CALCIUM 9.5 06/13/2019 1123   CALCIUM 9.2 05/17/2013 1228   PROT 6.6 06/13/2019 1123   ALBUMIN 4.2 06/13/2019 1123   AST 25 06/13/2019 1123   ALT 28 06/13/2019 1123   ALKPHOS 77 06/13/2019 1123   BILITOT 0.5 06/13/2019 1123   GFRNONAA 90 06/13/2019 1123   GFRNONAA >60 05/17/2013 1228   GFRAA 104 06/13/2019 1123   GFRAA >60 05/17/2013 1228     No results found.     Assessment & Plan:   1. Varicose veins with pain Recommend  I have reviewed my discussion with the patient regarding  varicose veins and why they cause symptoms. Patient will continue  wearing graduated compression stockings class 1 on a daily basis, beginning first thing in the morning and removing them in the evening.    In addition, behavioral modification including elevation during the day was again discussed and this will continue.  The patient has utilized over the counter pain medications and has been exercising.  However, at this time  conservative therapy has not alleviated the patient's symptoms of leg pain and swelling  Recommend: laser ablation of the right great saphenous vein and left accessory saphenous vein to eliminate the symptoms of pain and swelling of the lower extremities caused by the severe superficial venous reflux disease.   2. Essential (primary) hypertension Continue antihypertensive medications as already ordered, these medications have been reviewed and there are no changes at this time.    Current Outpatient Medications on File Prior to Visit  Medication Sig Dispense Refill  . Apremilast (OTEZLA) 30 MG TABS Take 1 tablet (30 mg total) by mouth 2 (two) times daily. 60 tablet 5  . Calcium-Phosphorus-Vitamin D (CITRACAL +D3 PO) Take 2 tablets by mouth daily.    . Cholecalciferol (VITAMIN D) 2000 units tablet Take 2,000 Units by mouth daily.    Marland Kitchen co-enzyme Q-10 30 MG capsule Take 30 mg by mouth daily.    . DELZICOL 400 MG CPDR DR capsule Take 2 tablets by mouth 2 (two) times daily. GI Dr  3  . lansoprazole (PREVACID) 30 MG capsule Take 1 capsule by mouth daily. GI Doc    . lisinopril-hydrochlorothiazide (ZESTORETIC) 10-12.5 MG tablet Take 1 tablet by mouth daily. 90 tablet 1  . meclizine (ANTIVERT) 25 MG tablet Take 1 tablet (25 mg total) by mouth 3 (three) times daily as needed for dizziness. 90 tablet 0  . Multiple Vitamins-Minerals (MULTIVITAMIN WOMEN) TABS Take by mouth.    . nystatin-triamcinolone (MYCOLOG II) cream Apply 1 application topically 2 (two) times daily. PRN/ Derm 30 g 1  . rosuvastatin (CRESTOR) 20 MG tablet Take 1 tablet (20 mg total) by mouth daily. 90 tablet 1  . triamcinolone (KENALOG) 0.1 % Apply 1 application topically as directed. Qd to bid aa itching on back until clear, avoid face, groin, axilla 80 g 1   No current facility-administered medications on file prior to visit.  There are no Patient Instructions on file for this visit. No follow-ups on file.   Kris Hartmann,  NP

## 2020-08-29 ENCOUNTER — Telehealth: Payer: Self-pay

## 2020-08-29 NOTE — Progress Notes (Signed)
Called with mammo appt on March 10th @ 8:20 in [place]

## 2020-08-29 NOTE — Telephone Encounter (Signed)
Called pt with mammo appt on March 10th @ 8:20 in [place]

## 2020-09-03 DIAGNOSIS — M9903 Segmental and somatic dysfunction of lumbar region: Secondary | ICD-10-CM | POA: Diagnosis not present

## 2020-09-03 DIAGNOSIS — M5416 Radiculopathy, lumbar region: Secondary | ICD-10-CM | POA: Diagnosis not present

## 2020-09-03 DIAGNOSIS — M9905 Segmental and somatic dysfunction of pelvic region: Secondary | ICD-10-CM | POA: Diagnosis not present

## 2020-09-03 DIAGNOSIS — M5136 Other intervertebral disc degeneration, lumbar region: Secondary | ICD-10-CM | POA: Diagnosis not present

## 2020-09-04 ENCOUNTER — Other Ambulatory Visit: Payer: Self-pay

## 2020-09-04 DIAGNOSIS — L409 Psoriasis, unspecified: Secondary | ICD-10-CM

## 2020-09-04 MED ORDER — OTEZLA 30 MG PO TABS: 30.0000 mg | ORAL_TABLET | Freq: Two times a day (BID) | ORAL | 1 refills | Status: DC

## 2020-09-04 NOTE — Progress Notes (Signed)
Patient came into office and requested 90 day supply of Otezla.

## 2020-09-11 ENCOUNTER — Ambulatory Visit
Admission: RE | Admit: 2020-09-11 | Discharge: 2020-09-11 | Disposition: A | Discharge status: Home / Self Care | Payer: Medicare Other | Source: Ambulatory Visit | Attending: Family Medicine | Admitting: Family Medicine

## 2020-09-11 ENCOUNTER — Other Ambulatory Visit: Payer: Self-pay

## 2020-09-11 DIAGNOSIS — Z1231 Encounter for screening mammogram for malignant neoplasm of breast: Secondary | ICD-10-CM | POA: Insufficient documentation

## 2020-09-17 ENCOUNTER — Telehealth (INDEPENDENT_AMBULATORY_CARE_PROVIDER_SITE_OTHER): Payer: Self-pay | Admitting: Vascular Surgery

## 2020-09-17 DIAGNOSIS — M9903 Segmental and somatic dysfunction of lumbar region: Secondary | ICD-10-CM | POA: Diagnosis not present

## 2020-09-17 DIAGNOSIS — M5416 Radiculopathy, lumbar region: Secondary | ICD-10-CM | POA: Diagnosis not present

## 2020-09-17 DIAGNOSIS — M5136 Other intervertebral disc degeneration, lumbar region: Secondary | ICD-10-CM | POA: Diagnosis not present

## 2020-09-17 DIAGNOSIS — M9905 Segmental and somatic dysfunction of pelvic region: Secondary | ICD-10-CM | POA: Diagnosis not present

## 2020-09-23 DIAGNOSIS — Z961 Presence of intraocular lens: Secondary | ICD-10-CM | POA: Diagnosis not present

## 2020-10-07 ENCOUNTER — Encounter (INDEPENDENT_AMBULATORY_CARE_PROVIDER_SITE_OTHER): Payer: Self-pay | Admitting: Vascular Surgery

## 2020-10-07 ENCOUNTER — Ambulatory Visit (INDEPENDENT_AMBULATORY_CARE_PROVIDER_SITE_OTHER): Payer: Medicare Other | Admitting: Vascular Surgery

## 2020-10-07 ENCOUNTER — Other Ambulatory Visit: Payer: Self-pay

## 2020-10-07 DIAGNOSIS — I83811 Varicose veins of right lower extremities with pain: Secondary | ICD-10-CM | POA: Diagnosis not present

## 2020-10-07 NOTE — Progress Notes (Signed)
Theresa Gross is a 65 y.o. female who presents with symptomatic venous reflux  Past Medical History:  Diagnosis Date  . Cervical cancer [facility])    age 39  . Diverticulosis   . Dysplastic nevus 11/29/2007   Right back. Severe atypia, extends to margin. Excised: 12/22/2007, margins free.  Marland Kitchen GERD (gastroesophageal reflux disease)   . Hemorrhoids   . History of colon polyps   . Hyperlipemia   . Hypertension   . Left sided colitis ([place])   . Psoriasis    Taking Otezla  . Vitamin D deficiency     Past Surgical History:  Procedure Laterality Date  . ABDOMINAL HYSTERECTOMY    . BREAST BIOPSY Right 10 plus yrs ago   stereo bx./clip. Benign  . CHOLECYSTECTOMY    . COLONOSCOPY WITH PROPOFOL N/A 06/16/2016   Procedure: COLONOSCOPY WITH PROPOFOL;  Surgeon: Manya Silvas, MD;  Location: [facility] ENDOSCOPY;  Service: Endoscopy;  Laterality: N/A;  . COLONOSCOPY WITH PROPOFOL N/A 12/17/2019   Procedure: COLONOSCOPY WITH PROPOFOL;  Surgeon: Toledo, Benay Pike, MD;  Location: [facility] ENDOSCOPY;  Service: Gastroenterology;  Laterality: N/A;  . DIAGNOSTIC LAPAROSCOPY    . ESOPHAGOGASTRODUODENOSCOPY (EGD) WITH PROPOFOL N/A 12/17/2019   Procedure: ESOPHAGOGASTRODUODENOSCOPY (EGD) WITH PROPOFOL;  Surgeon: Toledo, Benay Pike, MD;  Location: [facility] ENDOSCOPY;  Service: Gastroenterology;  Laterality: N/A;  . EYE SURGERY    . GALLBLADDER SURGERY  06/04/2014  . UPPER GASTROINTESTINAL ENDOSCOPY       Current Outpatient Medications:  .  Apremilast (OTEZLA) 30 MG TABS, Take 1 tablet (30 mg total) by mouth 2 (two) times daily., Disp: 180 tablet, Rfl: 1 .  Calcium-Phosphorus-Vitamin D (CITRACAL +D3 PO), Take 2 tablets by mouth daily., Disp: , Rfl:  .  Cholecalciferol (VITAMIN D) 2000 units tablet, Take 2,000 Units by mouth daily., Disp: , Rfl:  .  co-enzyme Q-10 30 MG capsule, Take 30 mg by mouth daily., Disp: , Rfl:  .  DELZICOL 400 MG CPDR DR capsule, Take 2 tablets by mouth 2 (two) times daily. GI Dr, Disp: , Rfl:  3 .  lansoprazole (PREVACID) 30 MG capsule, Take 1 capsule by mouth daily. GI Doc, Disp: , Rfl:  .  lisinopril-hydrochlorothiazide (ZESTORETIC) 10-12.5 MG tablet, Take 1 tablet by mouth daily., Disp: 90 tablet, Rfl: 1 .  meclizine (ANTIVERT) 25 MG tablet, Take 1 tablet (25 mg total) by mouth 3 (three) times daily as needed for dizziness., Disp: 90 tablet, Rfl: 0 .  Multiple Vitamins-Minerals (MULTIVITAMIN WOMEN) TABS, Take by mouth., Disp: , Rfl:  .  nystatin-triamcinolone (MYCOLOG II) cream, Apply 1 application topically 2 (two) times daily. PRN/ Derm, Disp: 30 g, Rfl: 1 .  rosuvastatin (CRESTOR) 20 MG tablet, Take 1 tablet (20 mg total) by mouth daily., Disp: 90 tablet, Rfl: 1 .  triamcinolone (KENALOG) 0.1 %, Apply 1 application topically as directed. Qd to bid aa itching on back until clear, avoid face, groin, axilla, Disp: 80 g, Rfl: 1  Allergies  Allergen Reactions  . Ciprofloxacin Nausea And Vomiting  . Lipitor [Atorvastatin] Other (See Comments)    Body Aches  . Metronidazole Nausea And Vomiting  . Nsaids Other (See Comments)    Have to be careful due to liver     Varicose veins of leg with pain, right     PLAN: The patient's right lower extremity was sterilely prepped and draped. The ultrasound machine was used to visualize the saphenous vein throughout its course. A segment in the mid to upper calf  was selected for access. The saphenous vein was accessed without difficulty using ultrasound guidance with a micropuncture needle. A 0.018 wire was then placed beyond the saphenofemoral junction and the needle was removed. The 65 cm sheath was then placed over the wire and the wire and dilator were removed. The laser fiber was then placed through the sheath and its tip was placed approximately 5 centimeters below the saphenofemoral junction. Tumescent anesthesia was then created with a dilute lidocaine solution. Laser energy was then delivered with constant withdrawal of the sheath and  laser fiber. Approximately 1312 joules of energy were delivered over a length of 32 centimeters using a 1470 Hz VenaCure machine at 7 W. Sterile dressings were placed. The patient tolerated the procedure well without obvious complications.   Follow-up in 1 week with post-laser duplex.

## 2020-10-13 ENCOUNTER — Ambulatory Visit: Payer: Medicare Other | Admitting: Family Medicine

## 2020-10-14 ENCOUNTER — Other Ambulatory Visit: Payer: Self-pay

## 2020-10-14 ENCOUNTER — Ambulatory Visit (INDEPENDENT_AMBULATORY_CARE_PROVIDER_SITE_OTHER): Payer: Medicare Other

## 2020-10-14 DIAGNOSIS — I83811 Varicose veins of right lower extremities with pain: Secondary | ICD-10-CM | POA: Diagnosis not present

## 2020-10-15 DIAGNOSIS — M5136 Other intervertebral disc degeneration, lumbar region: Secondary | ICD-10-CM | POA: Diagnosis not present

## 2020-10-15 DIAGNOSIS — M9905 Segmental and somatic dysfunction of pelvic region: Secondary | ICD-10-CM | POA: Diagnosis not present

## 2020-10-15 DIAGNOSIS — M9903 Segmental and somatic dysfunction of lumbar region: Secondary | ICD-10-CM | POA: Diagnosis not present

## 2020-10-15 DIAGNOSIS — M5416 Radiculopathy, lumbar region: Secondary | ICD-10-CM | POA: Diagnosis not present

## 2020-10-22 ENCOUNTER — Ambulatory Visit (INDEPENDENT_AMBULATORY_CARE_PROVIDER_SITE_OTHER): Payer: Medicare Other | Admitting: Family Medicine

## 2020-10-22 ENCOUNTER — Other Ambulatory Visit: Payer: Self-pay

## 2020-10-22 ENCOUNTER — Encounter: Payer: Self-pay | Admitting: Family Medicine

## 2020-10-22 VITALS — BP 122/74 | HR 76 | Ht 65.0 in | Wt 220.0 lb

## 2020-10-22 DIAGNOSIS — E7800 Pure hypercholesterolemia, unspecified: Secondary | ICD-10-CM | POA: Diagnosis not present

## 2020-10-22 DIAGNOSIS — I10 Essential (primary) hypertension: Secondary | ICD-10-CM

## 2020-10-22 MED ORDER — LISINOPRIL-HYDROCHLOROTHIAZIDE 10-12.5 MG PO TABS: 1.0000 | ORAL_TABLET | Freq: Every day | ORAL | 1 refills | Status: DC

## 2020-10-22 MED ORDER — ROSUVASTATIN CALCIUM 20 MG PO TABS: 20.0000 mg | ORAL_TABLET | Freq: Every day | ORAL | 1 refills | Status: DC

## 2020-10-22 NOTE — Progress Notes (Signed)
Date:  10/22/2020   Name:  Theresa Gross   DOB:  09-06-1955   MRN:  [id number]   Chief Complaint: No chief complaint on file.  Hyperlipidemia This is a chronic problem. The current episode started more than 1 year ago. The problem is controlled. Recent lipid tests were reviewed and are normal. She has no history of chronic renal disease, diabetes, hypothyroidism, liver disease, obesity or nephrotic syndrome. There are no known factors aggravating her hyperlipidemia. Pertinent negatives include no chest pain, focal weakness, leg pain, myalgias or shortness of breath. Current antihyperlipidemic treatment includes statins. The current treatment provides moderate improvement of lipids. There are no compliance problems.  There are no known risk factors for coronary artery disease.  Hypertension This is a chronic problem. The current episode started more than 1 year ago. The problem has been gradually improving since onset. The problem is controlled. Pertinent negatives include no anxiety, blurred vision, chest pain, headaches, malaise/fatigue, neck pain, orthopnea, palpitations, peripheral edema, PND, shortness of breath or sweats. There are no associated agents to hypertension. Past treatments include ACE inhibitors and diuretics. The current treatment provides moderate improvement. There are no compliance problems.  There is no history of angina, kidney disease, CAD/MI, CVA, heart failure, left ventricular hypertrophy, PVD or retinopathy. There is no history of chronic renal disease, a hypertension causing med or renovascular disease.    Lab Results  Component Value Date   CREATININE 0.71 06/13/2019   BUN 10 06/13/2019   NA 140 06/13/2019   K 4.4 06/13/2019   CL 104 06/13/2019   CO2 23 06/13/2019   Lab Results  Component Value Date   CHOL 171 04/30/2020   HDL 60 04/30/2020   LDLCALC 84 04/30/2020   TRIG 159 (H) 04/30/2020   CHOLHDL 4.6 (H) 08/23/2017   No results found for: TSH No  results found for: HGBA1C Lab Results  Component Value Date   WBC 6.9 05/17/2013   HGB 13.7 05/17/2013   HCT 39.4 05/17/2013   MCV 94 05/17/2013   PLT 241 05/17/2013   Lab Results  Component Value Date   ALT 28 06/13/2019   AST 25 06/13/2019   ALKPHOS 77 06/13/2019   BILITOT 0.5 06/13/2019     Review of Systems  Constitutional: Negative.  Negative for chills, fatigue, fever, malaise/fatigue and unexpected weight change.  HENT: Negative for congestion, ear discharge, ear pain, rhinorrhea, sinus pressure, sneezing and sore throat.   Eyes: Negative for blurred vision, photophobia, pain, discharge, redness and itching.  Respiratory: Negative for cough, shortness of breath, wheezing and stridor.   Cardiovascular: Negative for chest pain, palpitations, orthopnea and PND.  Gastrointestinal: Negative for abdominal pain, blood in stool, constipation, diarrhea, nausea and vomiting.  Endocrine: Negative for cold intolerance, heat intolerance, polydipsia, polyphagia and polyuria.  Genitourinary: Negative for dysuria, flank pain, frequency, hematuria, menstrual problem, pelvic pain, urgency, vaginal bleeding and vaginal discharge.  Musculoskeletal: Negative for arthralgias, back pain, myalgias and neck pain.  Skin: Negative for rash.  Allergic/Immunologic: Negative for environmental allergies and food allergies.  Neurological: Negative for dizziness, focal weakness, weakness, light-headedness, numbness and headaches.  Hematological: Negative for adenopathy. Does not bruise/bleed easily.  Psychiatric/Behavioral: Negative for dysphoric mood. The patient is not nervous/anxious.     Patient Active Problem List   Diagnosis Date Noted  . Varicose veins of leg with pain, right 10/07/2020  . Osteopenia 08/07/2020  . Class 2 obesity due to excess calories without serious comorbidity with body mass index (  BMI) of 37.0 to 37.9 in adult 02/16/2017  . Pure hypercholesterolemia 02/16/2017  . Aortic  atherosclerosis ([place]) 05/25/2016  . Steatosis of liver 05/25/2016  . Mixed hyperlipidemia 05/25/2016  . Essential (primary) hypertension 10/28/2014  . Gastro-esophageal reflux disease without esophagitis 10/28/2014  . H/O psoriasis 10/28/2014  . Decreased potassium in the blood 10/28/2014  . Familial multiple lipoprotein-type hyperlipidemia 10/28/2014    Allergies  Allergen Reactions  . Ciprofloxacin Nausea And Vomiting  . Lipitor [Atorvastatin] Other (See Comments)    Body Aches  . Metronidazole Nausea And Vomiting  . Nsaids Other (See Comments)    Have to be careful due to liver    Past Surgical History:  Procedure Laterality Date  . ABDOMINAL HYSTERECTOMY    . BREAST BIOPSY Right 10 plus yrs ago   stereo bx./clip. Benign  . CHOLECYSTECTOMY    . COLONOSCOPY WITH PROPOFOL N/A 06/16/2016   Procedure: COLONOSCOPY WITH PROPOFOL;  Surgeon: Manya Silvas, MD;  Location: [facility] ENDOSCOPY;  Service: Endoscopy;  Laterality: N/A;  . COLONOSCOPY WITH PROPOFOL N/A 12/17/2019   Procedure: COLONOSCOPY WITH PROPOFOL;  Surgeon: Toledo, Benay Pike, MD;  Location: [facility] ENDOSCOPY;  Service: Gastroenterology;  Laterality: N/A;  . DIAGNOSTIC LAPAROSCOPY    . ESOPHAGOGASTRODUODENOSCOPY (EGD) WITH PROPOFOL N/A 12/17/2019   Procedure: ESOPHAGOGASTRODUODENOSCOPY (EGD) WITH PROPOFOL;  Surgeon: Toledo, Benay Pike, MD;  Location: [facility] ENDOSCOPY;  Service: Gastroenterology;  Laterality: N/A;  . EYE SURGERY    . GALLBLADDER SURGERY  06/04/2014  . UPPER GASTROINTESTINAL ENDOSCOPY      Social History   Tobacco Use  . Smoking status: Former Research scientist (life sciences)  . Smokeless tobacco: Never Used  Vaping Use  . Vaping Use: Never used  Substance Use Topics  . Alcohol use: Yes    Alcohol/week: 1.0 standard drink    Types: 1 Cans of beer per week  . Drug use: No     Medication list has been reviewed and updated.  Current Meds  Medication Sig  . Apremilast (OTEZLA) 30 MG TABS Take 1 tablet (30 mg total) by mouth 2  (two) times daily.  . Calcium-Phosphorus-Vitamin D (CITRACAL +D3 PO) Take 2 tablets by mouth daily.  . Cholecalciferol (VITAMIN D) 2000 units tablet Take 2,000 Units by mouth daily.  Marland Kitchen co-enzyme Q-10 30 MG capsule Take 30 mg by mouth daily.  . DELZICOL 400 MG CPDR DR capsule Take 2 tablets by mouth 2 (two) times daily. GI Dr  . lansoprazole (PREVACID) 30 MG capsule Take 1 capsule by mouth daily. GI Doc  . lisinopril-hydrochlorothiazide (ZESTORETIC) 10-12.5 MG tablet Take 1 tablet by mouth daily.  . meclizine (ANTIVERT) 25 MG tablet Take 1 tablet (25 mg total) by mouth 3 (three) times daily as needed for dizziness.  . Multiple Vitamins-Minerals (MULTIVITAMIN WOMEN) TABS Take by mouth.  . nystatin-triamcinolone (MYCOLOG II) cream Apply 1 application topically 2 (two) times daily. PRN/ Derm  . rosuvastatin (CRESTOR) 20 MG tablet Take 1 tablet (20 mg total) by mouth daily.  Marland Kitchen triamcinolone (KENALOG) 0.1 % Apply 1 application topically as directed. Qd to bid aa itching on back until clear, avoid face, groin, axilla    PHQ 2/9 Scores 10/22/2020 08/20/2020 07/29/2020 04/30/2020  PHQ - 2 Score 0 0 0 0  PHQ- 9 Score 0 - 1 0    GAD 7 : Generalized Anxiety Score 10/22/2020 07/29/2020 04/30/2020 10/24/2019  Nervous, Anxious, on Edge 0 0 0 0  Control/stop worrying 0 0 0 0  Worry too much - different things 0  0 0 0  Trouble relaxing 0 0 0 0  Restless 0 0 0 0  Easily annoyed or irritable 0 0 0 0  Afraid - awful might happen 0 0 0 0  Total GAD 7 Score 0 0 0 0    BP Readings from Last 3 Encounters:  10/22/20 122/74  10/07/20 130/83  08/21/20 127/81    Physical Exam Vitals and nursing note reviewed.  Constitutional:      Appearance: She is well-developed.  HENT:     Head: Normocephalic.     Right Ear: Tympanic membrane, ear canal and external ear normal.     Left Ear: Tympanic membrane, ear canal and external ear normal.     Nose: Nose normal.  Eyes:     General: Lids are everted, no foreign  bodies appreciated. No scleral icterus.       Left eye: No foreign body or hordeolum.     Conjunctiva/sclera: Conjunctivae normal.     Right eye: Right conjunctiva is not injected.     Left eye: Left conjunctiva is not injected.     Pupils: Pupils are equal, round, and reactive to light.  Neck:     Thyroid: No thyromegaly.     Vascular: No JVD.     Trachea: No tracheal deviation.  Cardiovascular:     Rate and Rhythm: Normal rate and regular rhythm.     Heart sounds: Normal heart sounds. No murmur heard. No friction rub. No gallop.   Pulmonary:     Effort: Pulmonary effort is normal. No respiratory distress.     Breath sounds: Normal breath sounds. No wheezing or rales.  Abdominal:     General: Bowel sounds are normal.     Palpations: Abdomen is soft. There is no mass.     Tenderness: There is no abdominal tenderness. There is no guarding or rebound.  Musculoskeletal:        General: No tenderness. Normal range of motion.     Cervical back: Normal range of motion and neck supple.  Lymphadenopathy:     Cervical: No cervical adenopathy.  Skin:    General: Skin is warm.     Findings: No rash.  Neurological:     Mental Status: She is alert and oriented to person, place, and time.     Cranial Nerves: No cranial nerve deficit.     Deep Tendon Reflexes: Reflexes normal.  Psychiatric:        Mood and Affect: Mood is not anxious or depressed.     Wt Readings from Last 3 Encounters:  10/22/20 220 lb (99.8 kg)  10/07/20 225 lb (102.1 kg)  08/21/20 217 lb (98.4 kg)    BP 122/74   Pulse 76   Ht 5\' 5"  (1.651 m)   Wt 220 lb (99.8 kg)   BMI 36.61 kg/m   Assessment and Plan:  1. Essential (primary) hypertension Chronic.  Controlled.  Stable.  Blood pressure today is 122/74.  We will continue lisinopril hydrochlorothiazide 10-12.5 mg daily and recheck in 6 months. - lisinopril-hydrochlorothiazide (ZESTORETIC) 10-12.5 MG tablet; Take 1 tablet by mouth daily.  Dispense: 90 tablet;  Refill: 1  2. Pure hypercholesterolemia Chronic.  Controlled.  Stable.  Will control Crestor 20 mg once a day.  Review of previous lipid panel was done and we will hold and repeat in 6 months. - rosuvastatin (CRESTOR) 20 MG tablet; Take 1 tablet (20 mg total) by mouth daily.  Dispense: 90 tablet; Refill: 1

## 2020-10-29 DIAGNOSIS — M5136 Other intervertebral disc degeneration, lumbar region: Secondary | ICD-10-CM | POA: Diagnosis not present

## 2020-10-29 DIAGNOSIS — M9903 Segmental and somatic dysfunction of lumbar region: Secondary | ICD-10-CM | POA: Diagnosis not present

## 2020-10-29 DIAGNOSIS — M5416 Radiculopathy, lumbar region: Secondary | ICD-10-CM | POA: Diagnosis not present

## 2020-10-29 DIAGNOSIS — M9905 Segmental and somatic dysfunction of pelvic region: Secondary | ICD-10-CM | POA: Diagnosis not present

## 2020-10-30 ENCOUNTER — Telehealth (INDEPENDENT_AMBULATORY_CARE_PROVIDER_SITE_OTHER): Payer: Self-pay | Admitting: Vascular Surgery

## 2020-10-30 NOTE — Telephone Encounter (Signed)
Xanax 0.5 mg @ 2 tab with 0 refills take one 1 hour before procedure and 1 at the office were called into the patient's pharmacy at [facility] in [place].

## 2020-11-04 ENCOUNTER — Encounter (INDEPENDENT_AMBULATORY_CARE_PROVIDER_SITE_OTHER): Payer: Self-pay | Admitting: Vascular Surgery

## 2020-11-04 ENCOUNTER — Other Ambulatory Visit: Payer: Self-pay

## 2020-11-04 ENCOUNTER — Ambulatory Visit (INDEPENDENT_AMBULATORY_CARE_PROVIDER_SITE_OTHER): Payer: Medicare Other | Admitting: Vascular Surgery

## 2020-11-04 DIAGNOSIS — I83812 Varicose veins of left lower extremities with pain: Secondary | ICD-10-CM

## 2020-11-04 NOTE — Progress Notes (Signed)
Kahlia Lagunes Mckellar is a 65 y.o. female who presents with symptomatic venous reflux  Past Medical History:  Diagnosis Date  . Cervical cancer [facility])    age 47  . Diverticulosis   . Dysplastic nevus 11/29/2007   Right back. Severe atypia, extends to margin. Excised: 12/22/2007, margins free.  Marland Kitchen GERD (gastroesophageal reflux disease)   . Hemorrhoids   . History of colon polyps   . Hyperlipemia   . Hypertension   . Left sided colitis ([place])   . Psoriasis    Taking Otezla  . Vitamin D deficiency     Past Surgical History:  Procedure Laterality Date  . ABDOMINAL HYSTERECTOMY    . BREAST BIOPSY Right 10 plus yrs ago   stereo bx./clip. Benign  . CHOLECYSTECTOMY    . COLONOSCOPY WITH PROPOFOL N/A 06/16/2016   Procedure: COLONOSCOPY WITH PROPOFOL;  Surgeon: Manya Silvas, MD;  Location: [facility] ENDOSCOPY;  Service: Endoscopy;  Laterality: N/A;  . COLONOSCOPY WITH PROPOFOL N/A 12/17/2019   Procedure: COLONOSCOPY WITH PROPOFOL;  Surgeon: Toledo, Benay Pike, MD;  Location: [facility] ENDOSCOPY;  Service: Gastroenterology;  Laterality: N/A;  . DIAGNOSTIC LAPAROSCOPY    . ESOPHAGOGASTRODUODENOSCOPY (EGD) WITH PROPOFOL N/A 12/17/2019   Procedure: ESOPHAGOGASTRODUODENOSCOPY (EGD) WITH PROPOFOL;  Surgeon: Toledo, Benay Pike, MD;  Location: [facility] ENDOSCOPY;  Service: Gastroenterology;  Laterality: N/A;  . EYE SURGERY    . GALLBLADDER SURGERY  06/04/2014  . UPPER GASTROINTESTINAL ENDOSCOPY       Current Outpatient Medications:  .  Apremilast (OTEZLA) 30 MG TABS, Take 1 tablet (30 mg total) by mouth 2 (two) times daily., Disp: 180 tablet, Rfl: 1 .  Calcium-Phosphorus-Vitamin D (CITRACAL +D3 PO), Take 2 tablets by mouth daily., Disp: , Rfl:  .  Cholecalciferol (VITAMIN D) 2000 units tablet, Take 2,000 Units by mouth daily., Disp: , Rfl:  .  co-enzyme Q-10 30 MG capsule, Take 30 mg by mouth daily., Disp: , Rfl:  .  DELZICOL 400 MG CPDR DR capsule, Take 2 tablets by mouth 2 (two) times daily. GI Dr, Disp: , Rfl:  3 .  lansoprazole (PREVACID) 30 MG capsule, Take 1 capsule by mouth daily. GI Doc, Disp: , Rfl:  .  lisinopril-hydrochlorothiazide (ZESTORETIC) 10-12.5 MG tablet, Take 1 tablet by mouth daily., Disp: 90 tablet, Rfl: 1 .  meclizine (ANTIVERT) 25 MG tablet, Take 1 tablet (25 mg total) by mouth 3 (three) times daily as needed for dizziness., Disp: 90 tablet, Rfl: 0 .  Multiple Vitamins-Minerals (MULTIVITAMIN WOMEN) TABS, Take by mouth., Disp: , Rfl:  .  nystatin-triamcinolone (MYCOLOG II) cream, Apply 1 application topically 2 (two) times daily. PRN/ Derm, Disp: 30 g, Rfl: 1 .  rosuvastatin (CRESTOR) 20 MG tablet, Take 1 tablet (20 mg total) by mouth daily., Disp: 90 tablet, Rfl: 1 .  triamcinolone (KENALOG) 0.1 %, Apply 1 application topically as directed. Qd to bid aa itching on back until clear, avoid face, groin, axilla, Disp: 80 g, Rfl: 1  Allergies  Allergen Reactions  . Ciprofloxacin Nausea And Vomiting  . Lipitor [Atorvastatin] Other (See Comments)    Body Aches  . Metronidazole Nausea And Vomiting  . Nsaids Other (See Comments)    Have to be careful due to liver     Varicose veins of leg with pain, left     PLAN: The patient's left lower extremity was sterilely prepped and draped. The ultrasound machine was used to visualize the anterior accessory saphenous vein throughout its course. A segment in the mid thigh  was selected for access.  This was selected as there was about an 8 to 10 cm long straight segment that would accommodate the laser.  Below this with the prominent tortuous superficial varicosities, these were too tortuous and superficial to accommodate the laser and allow the wire to traverse.  The anterior accessory saphenous vein was accessed without difficulty using ultrasound guidance with a micropuncture needle. A 0.018 wire was then placed beyond the saphenofemoral junction and the needle was removed. The 65 cm sheath was then placed over the wire and the wire and dilator  were removed. The laser fiber was then placed through the sheath. Tumescent anesthesia was then created with a dilute lidocaine solution. Laser energy was then delivered with constant withdrawal of the sheath and laser fiber. Approximately 264 joules of energy were delivered over a length of 8 centimeters using a 1470 Hz VenaCure machine at 7 W. Sterile dressings were placed. The patient tolerated the procedure well without obvious complications.   Follow-up in 1 week with post-laser duplex.

## 2020-11-10 ENCOUNTER — Other Ambulatory Visit (INDEPENDENT_AMBULATORY_CARE_PROVIDER_SITE_OTHER): Payer: Self-pay | Admitting: Vascular Surgery

## 2020-11-10 DIAGNOSIS — I83812 Varicose veins of left lower extremities with pain: Secondary | ICD-10-CM

## 2020-11-11 ENCOUNTER — Other Ambulatory Visit: Payer: Self-pay

## 2020-11-11 ENCOUNTER — Ambulatory Visit (INDEPENDENT_AMBULATORY_CARE_PROVIDER_SITE_OTHER): Payer: Medicare Other

## 2020-11-11 DIAGNOSIS — I83812 Varicose veins of left lower extremities with pain: Secondary | ICD-10-CM | POA: Diagnosis not present

## 2020-11-19 DIAGNOSIS — M7731 Calcaneal spur, right foot: Secondary | ICD-10-CM | POA: Diagnosis not present

## 2020-11-19 DIAGNOSIS — M9903 Segmental and somatic dysfunction of lumbar region: Secondary | ICD-10-CM | POA: Diagnosis not present

## 2020-11-19 DIAGNOSIS — M722 Plantar fascial fibromatosis: Secondary | ICD-10-CM | POA: Diagnosis not present

## 2020-11-19 DIAGNOSIS — M5136 Other intervertebral disc degeneration, lumbar region: Secondary | ICD-10-CM | POA: Diagnosis not present

## 2020-11-19 DIAGNOSIS — M9905 Segmental and somatic dysfunction of pelvic region: Secondary | ICD-10-CM | POA: Diagnosis not present

## 2020-11-19 DIAGNOSIS — M5416 Radiculopathy, lumbar region: Secondary | ICD-10-CM | POA: Diagnosis not present

## 2020-12-09 ENCOUNTER — Encounter (INDEPENDENT_AMBULATORY_CARE_PROVIDER_SITE_OTHER): Payer: Self-pay | Admitting: Vascular Surgery

## 2020-12-09 ENCOUNTER — Other Ambulatory Visit: Payer: Self-pay

## 2020-12-09 ENCOUNTER — Ambulatory Visit (INDEPENDENT_AMBULATORY_CARE_PROVIDER_SITE_OTHER): Payer: Medicare Other | Admitting: Vascular Surgery

## 2020-12-09 VITALS — BP 120/85 | HR 71 | Resp 16 | Wt 220.0 lb

## 2020-12-09 DIAGNOSIS — M9903 Segmental and somatic dysfunction of lumbar region: Secondary | ICD-10-CM | POA: Diagnosis not present

## 2020-12-09 DIAGNOSIS — I83812 Varicose veins of left lower extremities with pain: Secondary | ICD-10-CM | POA: Diagnosis not present

## 2020-12-09 DIAGNOSIS — M9905 Segmental and somatic dysfunction of pelvic region: Secondary | ICD-10-CM | POA: Diagnosis not present

## 2020-12-09 DIAGNOSIS — E782 Mixed hyperlipidemia: Secondary | ICD-10-CM

## 2020-12-09 DIAGNOSIS — I10 Essential (primary) hypertension: Secondary | ICD-10-CM | POA: Diagnosis not present

## 2020-12-09 DIAGNOSIS — M5416 Radiculopathy, lumbar region: Secondary | ICD-10-CM | POA: Diagnosis not present

## 2020-12-09 DIAGNOSIS — M5136 Other intervertebral disc degeneration, lumbar region: Secondary | ICD-10-CM | POA: Diagnosis not present

## 2020-12-09 NOTE — Assessment & Plan Note (Signed)
lipid control important in reducing the progression of atherosclerotic disease. Continue statin therapy  

## 2020-12-09 NOTE — Assessment & Plan Note (Signed)
Recommend:  The patient has had successful ablation of the previously incompetent saphenous venous system but still has persistent symptoms of pain and swelling that are having a negative impact on daily life and daily activities. This is worse on the left leg than the right  Patient should undergo injection sclerotherapy and in her case largely foam sclerotherapy to treat the residual varicosities.  The risks, benefits and alternative therapies were reviewed in detail with the patient.  All questions were answered.  The patient agrees to proceed with sclerotherapy at their convenience.  The patient will continue wearing the graduated compression stockings and using the over-the-counter pain medications to treat her symptoms.

## 2020-12-09 NOTE — Progress Notes (Signed)
MRN : [id number]  Theresa Gross is a 65 y.o. (02-16-1956) female who presents with chief complaint of  Chief Complaint  Patient presents with  . Follow-up    4week post laser  .  History of Present Illness: Patient returns today in follow up of her venous disease.  She is status post laser ablation of the right great saphenous vein and what turned out to be the left anterior accessory saphenous vein although the laser ablation on the left was less extensive due to the tortuosity of the vessel.  Her legs do feel significantly better.  There is less pain and swelling.  She does still have painful residual varicosities worse on the left than the right with significant portions of the anterior accessory saphenous vein still being prominent and painful on the left leg.  Current Outpatient Medications  Medication Sig Dispense Refill  . Apremilast (OTEZLA) 30 MG TABS Take 1 tablet (30 mg total) by mouth 2 (two) times daily. 180 tablet 1  . Calcium-Phosphorus-Vitamin D (CITRACAL +D3 PO) Take 2 tablets by mouth daily.    . Cholecalciferol (VITAMIN D) 2000 units tablet Take 2,000 Units by mouth daily.    Marland Kitchen co-enzyme Q-10 30 MG capsule Take 30 mg by mouth daily.    . DELZICOL 400 MG CPDR DR capsule Take 2 tablets by mouth 2 (two) times daily. GI Dr  3  . lansoprazole (PREVACID) 30 MG capsule Take 1 capsule by mouth daily. GI Doc    . lisinopril-hydrochlorothiazide (ZESTORETIC) 10-12.5 MG tablet Take 1 tablet by mouth daily. 90 tablet 1  . meclizine (ANTIVERT) 25 MG tablet Take 1 tablet (25 mg total) by mouth 3 (three) times daily as needed for dizziness. 90 tablet 0  . Multiple Vitamins-Minerals (MULTIVITAMIN WOMEN) TABS Take by mouth.    . nystatin-triamcinolone (MYCOLOG II) cream Apply 1 application topically 2 (two) times daily. PRN/ Derm 30 g 1  . rosuvastatin (CRESTOR) 20 MG tablet Take 1 tablet (20 mg total) by mouth daily. 90 tablet 1  . triamcinolone (KENALOG) 0.1 % Apply 1 application  topically as directed. Qd to bid aa itching on back until clear, avoid face, groin, axilla 80 g 1   No current facility-administered medications for this visit.    Past Medical History:  Diagnosis Date  . Cervical cancer [facility])    age 26  . Diverticulosis   . Dysplastic nevus 11/29/2007   Right back. Severe atypia, extends to margin. Excised: 12/22/2007, margins free.  Marland Kitchen GERD (gastroesophageal reflux disease)   . Hemorrhoids   . History of colon polyps   . Hyperlipemia   . Hypertension   . Left sided colitis ([place])   . Psoriasis    Taking Otezla  . Vitamin D deficiency     Past Surgical History:  Procedure Laterality Date  . ABDOMINAL HYSTERECTOMY    . BREAST BIOPSY Right 10 plus yrs ago   stereo bx./clip. Benign  . CHOLECYSTECTOMY    . COLONOSCOPY WITH PROPOFOL N/A 06/16/2016   Procedure: COLONOSCOPY WITH PROPOFOL;  Surgeon: Manya Silvas, MD;  Location: [facility] ENDOSCOPY;  Service: Endoscopy;  Laterality: N/A;  . COLONOSCOPY WITH PROPOFOL N/A 12/17/2019   Procedure: COLONOSCOPY WITH PROPOFOL;  Surgeon: Toledo, Benay Pike, MD;  Location: [facility] ENDOSCOPY;  Service: Gastroenterology;  Laterality: N/A;  . DIAGNOSTIC LAPAROSCOPY    . ESOPHAGOGASTRODUODENOSCOPY (EGD) WITH PROPOFOL N/A 12/17/2019   Procedure: ESOPHAGOGASTRODUODENOSCOPY (EGD) WITH PROPOFOL;  Surgeon: Toledo, Benay Pike, MD;  Location: [facility] ENDOSCOPY;  Service: Gastroenterology;  Laterality: N/A;  . EYE SURGERY    . GALLBLADDER SURGERY  06/04/2014  . UPPER GASTROINTESTINAL ENDOSCOPY       Social History   Tobacco Use  . Smoking status: Former Research scientist (life sciences)  . Smokeless tobacco: Never Used  Vaping Use  . Vaping Use: Never used  Substance Use Topics  . Alcohol use: Yes    Alcohol/week: 1.0 standard drink    Types: 1 Cans of beer per week  . Drug use: No      Family History  Problem Relation Age of Onset  . Colon cancer Mother   . Diabetes Father   . Breast cancer Neg Hx      Allergies  Allergen Reactions  .  Ciprofloxacin Nausea And Vomiting  . Lipitor [Atorvastatin] Other (See Comments)    Body Aches  . Metronidazole Nausea And Vomiting  . Nsaids Other (See Comments)    Have to be careful due to liver     REVIEW OF SYSTEMS (Negative unless checked)  Constitutional: [] Weight loss  [] Fever  [] Chills Cardiac: [] Chest pain   [] Chest pressure   [] Palpitations   [] Shortness of breath when laying flat   [] Shortness of breath at rest   [] Shortness of breath with exertion. Vascular:  [x] Pain in legs with walking   [x] Pain in legs at rest   [] Pain in legs when laying flat   [] Claudication   [] Pain in feet when walking  [] Pain in feet at rest  [] Pain in feet when laying flat   [] History of DVT   [] Phlebitis   [x] Swelling in legs   [x] Varicose veins   [] Non-healing ulcers Pulmonary:   [] Uses home oxygen   [] Productive cough   [] Hemoptysis   [] Wheeze  [] COPD   [] Asthma Neurologic:  [] Dizziness  [] Blackouts   [] Seizures   [] History of stroke   [] History of TIA  [] Aphasia   [] Temporary blindness   [] Dysphagia   [] Weakness or numbness in arms   [] Weakness or numbness in legs Musculoskeletal:  [] Arthritis   [] Joint swelling   [] Joint pain   [] Low back pain Hematologic:  [] Easy bruising  [] Easy bleeding   [] Hypercoagulable state   [] Anemic   Gastrointestinal:  [] Blood in stool   [] Vomiting blood  [] Gastroesophageal reflux/heartburn   [] Abdominal pain Genitourinary:  [] Chronic kidney disease   [] Difficult urination  [] Frequent urination  [] Burning with urination   [] Hematuria Skin:  [] Rashes   [] Ulcers   [] Wounds Psychological:  [] History of anxiety   []  History of major depression.  Physical Examination  BP 120/85 (BP Location: Right Arm)   Pulse 71   Resp 16   Wt 220 lb (99.8 kg)   BMI 36.61 kg/m  Gen:  WD/WN, NAD Head: [place]/AT, No temporalis wasting. Ear/Nose/Throat: Hearing grossly intact, nares w/o erythema or drainage Eyes: Conjunctiva clear. Sclera non-icteric Neck: Supple.  Trachea  midline Pulmonary:  Good air movement, no use of accessory muscles.  Cardiac: RRR, no JVD Vascular: Scattered varicosities on the right.  On the left, the varicosities are far more prominent measuring at least 2 to 3 mm in diameter coursing throughout the anterior accessory saphenous vein distribution.  These are smaller than prior to laser ablation, but are still prominent. Vessel Right Left  Radial Palpable Palpable               Musculoskeletal: M/S 5/5 throughout.  No deformity or atrophy.  No significant lower extremity edema. Neurologic: Sensation grossly intact in extremities.  Symmetrical.  Speech is  fluent.  Psychiatric: Judgment intact, Mood & affect appropriate for pt's clinical situation. Dermatologic: No rashes or ulcers noted.  No cellulitis or open wounds.       Labs No results found for this or any previous visit (from the past 2160 hour(s)).  Radiology VAS [place] LOWER EXTREMITY VENOUS POST ABLATION  Result Date: 11/14/2020  Lower Venous Reflux Study Patient Name:  ANIA LEVAY Novack  Date of Exam:   11/11/2020 Medical Rec #: [id number]        Accession #:    [id number] Date of Birth: [birth year]        Patient Gender: F Patient Age:   49Y Exam Location:  [facility] Procedure:      VAS [place] LOWER EXTREMITY VENOUS POST ABLATION Referring Phys: [id number] [place] --------------------------------------------------------------------------------  Indications: Post ablation left anterior accessory.  Performing Technologist: Concha Norway RVT  Examination Guidelines: A complete evaluation includes B-mode imaging, spectral Doppler, color Doppler, and power Doppler as needed of all accessible portions of each vessel. Bilateral testing is considered an integral part of a complete examination. Limited examinations for reoccurring indications may be performed as noted. The reflux portion of the exam is performed with the patient in reverse Trendelenburg. Significant venous reflux is  defined as >500 ms in the superficial venous system, and >1 second in the deep venous system.   Summary: Left: - No evidence of deep vein thrombosis seen in the left lower extremity, from the common femoral through the popliteal veins. - Left anterior accessory successful closed.  *See table(s) above for measurements and observations. Electronically signed by Leotis Pain MD on 11/14/2020 at 12:27:59 PM.    Final     Assessment/Plan  Varicose veins of leg with pain, left Recommend:  The patient has had successful ablation of the previously incompetent saphenous venous system but still has persistent symptoms of pain and swelling that are having a negative impact on daily life and daily activities. This is worse on the left leg than the right  Patient should undergo injection sclerotherapy and in her case largely foam sclerotherapy to treat the residual varicosities.  The risks, benefits and alternative therapies were reviewed in detail with the patient.  All questions were answered.  The patient agrees to proceed with sclerotherapy at their convenience.  The patient will continue wearing the graduated compression stockings and using the over-the-counter pain medications to treat her symptoms.       Essential (primary) hypertension blood pressure control important in reducing the progression of atherosclerotic disease. On appropriate oral medications.   Mixed hyperlipidemia lipid control important in reducing the progression of atherosclerotic disease. Continue statin therapy     Leotis Pain, MD  12/09/2020 10:33 AM    This note was created with Dragon medical transcription system.  Any errors from dictation are purely unintentional

## 2020-12-09 NOTE — Assessment & Plan Note (Signed)
blood pressure control important in reducing the progression of atherosclerotic disease. On appropriate oral medications.  

## 2020-12-10 DIAGNOSIS — M7731 Calcaneal spur, right foot: Secondary | ICD-10-CM | POA: Diagnosis not present

## 2020-12-10 DIAGNOSIS — M722 Plantar fascial fibromatosis: Secondary | ICD-10-CM | POA: Diagnosis not present

## 2021-01-07 DIAGNOSIS — M5416 Radiculopathy, lumbar region: Secondary | ICD-10-CM | POA: Diagnosis not present

## 2021-01-07 DIAGNOSIS — M5136 Other intervertebral disc degeneration, lumbar region: Secondary | ICD-10-CM | POA: Diagnosis not present

## 2021-01-07 DIAGNOSIS — M9905 Segmental and somatic dysfunction of pelvic region: Secondary | ICD-10-CM | POA: Diagnosis not present

## 2021-01-07 DIAGNOSIS — M9903 Segmental and somatic dysfunction of lumbar region: Secondary | ICD-10-CM | POA: Diagnosis not present

## 2021-01-20 DIAGNOSIS — K219 Gastro-esophageal reflux disease without esophagitis: Secondary | ICD-10-CM | POA: Diagnosis not present

## 2021-01-20 DIAGNOSIS — K449 Diaphragmatic hernia without obstruction or gangrene: Secondary | ICD-10-CM | POA: Diagnosis not present

## 2021-01-20 DIAGNOSIS — K5150 Left sided colitis without complications: Secondary | ICD-10-CM | POA: Diagnosis not present

## 2021-02-04 DIAGNOSIS — M9905 Segmental and somatic dysfunction of pelvic region: Secondary | ICD-10-CM | POA: Diagnosis not present

## 2021-02-04 DIAGNOSIS — M5416 Radiculopathy, lumbar region: Secondary | ICD-10-CM | POA: Diagnosis not present

## 2021-02-04 DIAGNOSIS — M5136 Other intervertebral disc degeneration, lumbar region: Secondary | ICD-10-CM | POA: Diagnosis not present

## 2021-02-04 DIAGNOSIS — M9903 Segmental and somatic dysfunction of lumbar region: Secondary | ICD-10-CM | POA: Diagnosis not present

## 2021-02-13 ENCOUNTER — Ambulatory Visit (INDEPENDENT_AMBULATORY_CARE_PROVIDER_SITE_OTHER): Payer: Medicare Other | Admitting: Vascular Surgery

## 2021-02-17 ENCOUNTER — Ambulatory Visit (INDEPENDENT_AMBULATORY_CARE_PROVIDER_SITE_OTHER): Payer: Medicare Other | Admitting: Dermatology

## 2021-02-17 ENCOUNTER — Other Ambulatory Visit: Payer: Self-pay

## 2021-02-17 DIAGNOSIS — L409 Psoriasis, unspecified: Secondary | ICD-10-CM | POA: Diagnosis not present

## 2021-02-17 MED ORDER — OTEZLA 30 MG PO TABS: 30.0000 mg | ORAL_TABLET | Freq: Two times a day (BID) | ORAL | 1 refills | Status: DC

## 2021-02-17 MED ORDER — KETOCONAZOLE 2 % EX CREA: 1.0000 "application " | TOPICAL_CREAM | Freq: Every day | CUTANEOUS | 5 refills | Status: AC

## 2021-02-17 NOTE — Patient Instructions (Signed)
Side effects of Otezla (apremilast) include diarrhea, nausea, headache, upper respiratory infection, depression, and weight decrease (5-10%). It should only be taken by pregnant women after a discussion regarding risks and benefits with their doctor. Goal is control of skin condition, not cure.  The use of Otezla requires long term medication management, including periodic office visits.   If you have any questions or concerns for your doctor, please call our main line at [phone] and press option 4 to reach your doctor's medical assistant. If no one answers, please leave a voicemail as directed and we will return your call as soon as possible. Messages left after 4 pm will be answered the following business day.   You may also send us a message via MyChart. We typically respond to MyChart messages within 1-2 business days.  For prescription refills, please ask your pharmacy to contact our office. Our fax number is [phone].  If you have an urgent issue when the clinic is closed that cannot wait until the next business day, you can page your doctor at the number below.    Please note that while we do our best to be available for urgent issues outside of office hours, we are not available 24/7.   If you have an urgent issue and are unable to reach us, you may choose to seek medical care at your doctor's office, retail clinic, urgent care center, or emergency room.  If you have a medical emergency, please immediately call 911 or go to the emergency department.  Pager Numbers  - Dr. Kowalski: [phone]  - Dr. Moye: [phone]  - Dr. Stewart: [phone]  In the event of inclement weather, please call our main line at [phone] for an update on the status of any delays or closures.  Dermatology Medication Tips: Please keep the boxes that topical medications come in in order to help keep track of the instructions about where and how to use these. Pharmacies typically print the  medication instructions only on the boxes and not directly on the medication tubes.   If your medication is too expensive, please contact our office at [phone] option 4 or send us a message through MyChart.   We are unable to tell what your co-pay for medications will be in advance as this is different depending on your insurance coverage. However, we may be able to find a substitute medication at lower cost or fill out paperwork to get insurance to cover a needed medication.   If a prior authorization is required to get your medication covered by your insurance company, please allow us 1-2 business days to complete this process.  Drug prices often vary depending on where the prescription is filled and some pharmacies may offer cheaper prices.  The website www.goodrx.com contains coupons for medications through different pharmacies. The prices here do not account for what the cost may be with help from insurance (it may be cheaper with your insurance), but the website can give you the price if you did not use any insurance.  - You can print the associated coupon and take it with your prescription to the pharmacy.  - You may also stop by our office during regular business hours and pick up a GoodRx coupon card.  - If you need your prescription sent electronically to a different pharmacy, notify our office through [facility] MyChart or by phone at [phone] option 4.  

## 2021-02-17 NOTE — Progress Notes (Signed)
   Follow-Up Visit   Subjective  Theresa Gross is a 65 y.o. female who presents for the following: Follow-up (Patient here today for 6 month follow up on psoriasis. Patient reports her psoriasis is doing well and she is clear. She is currently using otezla 30 mg by mouth twice daily and taclonex as needed for flares. She reports needing refills for otezla. ). No side effects from the [place], no GI upset, no depression.  Pt is very happy with results.   The following portions of the chart were reviewed this encounter and updated as appropriate:       Objective  Well appearing patient in no apparent distress; mood and affect are within normal limits.  A focused examination was performed including scalp and elbows. Relevant physical exam findings are noted in the Assessment and Plan.  Scalp, elbows, nose, glabella Pink scaly patch at Right occipital scalp  Mild erythema and scale at glabella and alar crease  Mild erythema and scale at left elbow   Assessment & Plan  Psoriasis Scalp, elbows, nose, glabella   Chronic condition, much improved on Otezla, but not clear   Psoriasis - severe on systemic treatment.  Psoriasis is a chronic non-curable, but treatable genetic/hereditary disease that may have other systemic features affecting other organ systems such as joints (Psoriatic Arthritis).  It is linked with heart disease, inflammatory bowel disease, non-alcoholic fatty liver disease, and depression.      Cont Otezla '30mg'$  1 po bid Cont Taclonex susp qd scalp prn flares Start Ketoconazole 2 % cream - non steriod apply to affected areas face nightly    Side effects of Otezla (apremilast) include diarrhea, nausea, headache, upper respiratory infection, depression, and weight decrease (5-10%). It should only be taken by pregnant women after a discussion regarding risks and benefits with their doctor. Goal is control of skin condition, not cure.  The use of [place] requires long term  medication management, including periodic office visits.    ketoconazole (NIZORAL) 2 % cream - Scalp, elbows, nose, glabella Apply 1 application topically at bedtime. Apply to affected areas  Related Medications Apremilast (OTEZLA) 30 MG TABS Take 1 tablet (30 mg total) by mouth 2 (two) times daily.  Return in about 6 months (around 08/20/2021) for psoriasis on [place]. I, [name], CMA, am acting as scribe for [name], MD.  Documentation: I have reviewed the above documentation for accuracy and completeness, and I agree with the above.  [name] MD

## 2021-02-20 ENCOUNTER — Encounter (INDEPENDENT_AMBULATORY_CARE_PROVIDER_SITE_OTHER): Payer: Self-pay | Admitting: Vascular Surgery

## 2021-02-20 ENCOUNTER — Other Ambulatory Visit: Payer: Self-pay

## 2021-02-20 ENCOUNTER — Ambulatory Visit (INDEPENDENT_AMBULATORY_CARE_PROVIDER_SITE_OTHER): Payer: Medicare Other | Admitting: Vascular Surgery

## 2021-02-20 VITALS — BP 129/81 | HR 60 | Ht 66.0 in | Wt 224.0 lb

## 2021-02-20 DIAGNOSIS — I83812 Varicose veins of left lower extremities with pain: Secondary | ICD-10-CM

## 2021-02-20 NOTE — Progress Notes (Signed)
Theresa Gross is a 65 y.o.female who presents with painful varicose veins of the left leg  Past Medical History:  Diagnosis Date   Cervical cancer [facility])    age 73   Diverticulosis    Dysplastic nevus 11/29/2007   Right back. Severe atypia, extends to margin. Excised: 12/22/2007, margins free.   GERD (gastroesophageal reflux disease)    Hemorrhoids    History of colon polyps    Hyperlipemia    Hypertension    Left sided colitis ([place])    Psoriasis    Taking Otezla   Vitamin D deficiency     Past Surgical History:  Procedure Laterality Date   ABDOMINAL HYSTERECTOMY     BREAST BIOPSY Right 10 plus yrs ago   stereo bx./clip. Benign   CHOLECYSTECTOMY     COLONOSCOPY WITH PROPOFOL N/A 06/16/2016   Procedure: COLONOSCOPY WITH PROPOFOL;  Surgeon: Manya Silvas, MD;  Location: [facility] ENDOSCOPY;  Service: Endoscopy;  Laterality: N/A;   COLONOSCOPY WITH PROPOFOL N/A 12/17/2019   Procedure: COLONOSCOPY WITH PROPOFOL;  Surgeon: Toledo, Benay Pike, MD;  Location: [facility] ENDOSCOPY;  Service: Gastroenterology;  Laterality: N/A;   DIAGNOSTIC LAPAROSCOPY     ESOPHAGOGASTRODUODENOSCOPY (EGD) WITH PROPOFOL N/A 12/17/2019   Procedure: ESOPHAGOGASTRODUODENOSCOPY (EGD) WITH PROPOFOL;  Surgeon: Toledo, Benay Pike, MD;  Location: [facility] ENDOSCOPY;  Service: Gastroenterology;  Laterality: N/A;   EYE SURGERY     GALLBLADDER SURGERY  06/04/2014   UPPER GASTROINTESTINAL ENDOSCOPY      Current Outpatient Medications  Medication Sig Dispense Refill   Apremilast (OTEZLA) 30 MG TABS Take 1 tablet (30 mg total) by mouth 2 (two) times daily. 180 tablet 1   Calcium-Phosphorus-Vitamin D (CITRACAL +D3 PO) Take 2 tablets by mouth daily.     Cholecalciferol (VITAMIN D) 2000 units tablet Take 2,000 Units by mouth daily.     co-enzyme Q-10 30 MG capsule Take 30 mg by mouth daily.     DELZICOL 400 MG CPDR DR capsule Take 2 tablets by mouth 2 (two) times daily. GI Dr  3   ketoconazole (NIZORAL) 2 % cream Apply 1  application topically at bedtime. Apply to affected areas 30 g 5   lansoprazole (PREVACID) 30 MG capsule Take 1 capsule by mouth daily. GI Doc     lansoprazole (PREVACID) 30 MG capsule Take by mouth.     lisinopril-hydrochlorothiazide (ZESTORETIC) 10-12.5 MG tablet Take 1 tablet by mouth daily. 90 tablet 1   meclizine (ANTIVERT) 25 MG tablet Take 1 tablet (25 mg total) by mouth 3 (three) times daily as needed for dizziness. 90 tablet 0   Multiple Vitamins-Minerals (MULTIVITAMIN WOMEN) TABS Take by mouth.     nystatin-triamcinolone (MYCOLOG II) cream Apply 1 application topically 2 (two) times daily. PRN/ Derm 30 g 1   rosuvastatin (CRESTOR) 20 MG tablet Take 1 tablet (20 mg total) by mouth daily. 90 tablet 1   triamcinolone (KENALOG) 0.1 % Apply 1 application topically as directed. Qd to bid aa itching on back until clear, avoid face, groin, axilla 80 g 1   No current facility-administered medications for this visit.    Allergies  Allergen Reactions   Ciprofloxacin Nausea And Vomiting   Lipitor [Atorvastatin] Other (See Comments)    Body Aches   Metronidazole Nausea And Vomiting   Nsaids Other (See Comments)    Have to be careful due to liver    Indication: Patient presents with symptomatic varicose veins of the left lower extremity.  Procedure: Foam sclerotherapy was performed on  the left lower extremity. Using ultrasound guidance, 5 mL of foam Sotradecol was used to inject the varicosities of the left lower extremity. Compression wraps were placed. The patient tolerated the procedure well.

## 2021-03-04 DIAGNOSIS — M9905 Segmental and somatic dysfunction of pelvic region: Secondary | ICD-10-CM | POA: Diagnosis not present

## 2021-03-04 DIAGNOSIS — M5416 Radiculopathy, lumbar region: Secondary | ICD-10-CM | POA: Diagnosis not present

## 2021-03-04 DIAGNOSIS — M5136 Other intervertebral disc degeneration, lumbar region: Secondary | ICD-10-CM | POA: Diagnosis not present

## 2021-03-04 DIAGNOSIS — M9903 Segmental and somatic dysfunction of lumbar region: Secondary | ICD-10-CM | POA: Diagnosis not present

## 2021-03-10 ENCOUNTER — Other Ambulatory Visit: Payer: Self-pay

## 2021-03-10 ENCOUNTER — Encounter (INDEPENDENT_AMBULATORY_CARE_PROVIDER_SITE_OTHER): Payer: Self-pay | Admitting: Vascular Surgery

## 2021-03-10 ENCOUNTER — Ambulatory Visit (INDEPENDENT_AMBULATORY_CARE_PROVIDER_SITE_OTHER): Payer: Medicare Other | Admitting: Vascular Surgery

## 2021-03-10 VITALS — BP 133/84 | HR 71 | Resp 16 | Wt 223.4 lb

## 2021-03-10 DIAGNOSIS — I83812 Varicose veins of left lower extremities with pain: Secondary | ICD-10-CM

## 2021-03-10 NOTE — Progress Notes (Signed)
Delcina Mcleish Gershman is a 65 y.o.female who presents with painful varicose veins of the left leg  Past Medical History:  Diagnosis Date   Cervical cancer [facility])    age 57   Diverticulosis    Dysplastic nevus 11/29/2007   Right back. Severe atypia, extends to margin. Excised: 12/22/2007, margins free.   GERD (gastroesophageal reflux disease)    Hemorrhoids    History of colon polyps    Hyperlipemia    Hypertension    Left sided colitis ([place])    Psoriasis    Taking Otezla   Vitamin D deficiency     Past Surgical History:  Procedure Laterality Date   ABDOMINAL HYSTERECTOMY     BREAST BIOPSY Right 10 plus yrs ago   stereo bx./clip. Benign   CHOLECYSTECTOMY     COLONOSCOPY WITH PROPOFOL N/A 06/16/2016   Procedure: COLONOSCOPY WITH PROPOFOL;  Surgeon: Manya Silvas, MD;  Location[facility] ENDOSCOPY;  Service: Endoscopy;  Laterality: N/A;   COLONOSCOPY WITH PROPOFOL N/A 12/17/2019   Procedure: COLONOSCOPY WITH PROPOFOL;  Surgeon: Toledo, Benay Pike, MD;  Location: [facility] ENDOSCOPY;  Service: Gastroenterology;  Laterality: N/A;   DIAGNOSTIC LAPAROSCOPY     ESOPHAGOGASTRODUODENOSCOPY (EGD) WITH PROPOFOL N/A 12/17/2019   Procedure: ESOPHAGOGASTRODUODENOSCOPY (EGD) WITH PROPOFOL;  Surgeon: Toledo, Benay Pike, MD;  Location: [facility] ENDOSCOPY;  Service: Gastroenterology;  Laterality: N/A;   EYE SURGERY     GALLBLADDER SURGERY  06/04/2014   UPPER GASTROINTESTINAL ENDOSCOPY      Current Outpatient Medications  Medication Sig Dispense Refill   Apremilast (OTEZLA) 30 MG TABS Take 1 tablet (30 mg total) by mouth 2 (two) times daily. 180 tablet 1   Calcium-Phosphorus-Vitamin D (CITRACAL +D3 PO) Take 2 tablets by mouth daily.     Cholecalciferol (VITAMIN D) 2000 units tablet Take 2,000 Units by mouth daily.     co-enzyme Q-10 30 MG capsule Take 30 mg by mouth daily.     DELZICOL 400 MG CPDR DR capsule Take 2 tablets by mouth 2 (two) times daily. GI Dr  3   ketoconazole (NIZORAL) 2 % cream Apply 1  application topically at bedtime. Apply to affected areas 30 g 5   lansoprazole (PREVACID) 30 MG capsule Take 1 capsule by mouth daily. GI Doc     lansoprazole (PREVACID) 30 MG capsule Take by mouth.     lisinopril-hydrochlorothiazide (ZESTORETIC) 10-12.5 MG tablet Take 1 tablet by mouth daily. 90 tablet 1   meclizine (ANTIVERT) 25 MG tablet Take 1 tablet (25 mg total) by mouth 3 (three) times daily as needed for dizziness. 90 tablet 0   Multiple Vitamins-Minerals (MULTIVITAMIN WOMEN) TABS Take by mouth.     nystatin-triamcinolone (MYCOLOG II) cream Apply 1 application topically 2 (two) times daily. PRN/ Derm 30 g 1   rosuvastatin (CRESTOR) 20 MG tablet Take 1 tablet (20 mg total) by mouth daily. 90 tablet 1   triamcinolone (KENALOG) 0.1 % Apply 1 application topically as directed. Qd to bid aa itching on back until clear, avoid face, groin, axilla 80 g 1   No current facility-administered medications for this visit.    Allergies  Allergen Reactions   Ciprofloxacin Nausea And Vomiting   Lipitor [Atorvastatin] Other (See Comments)    Body Aches   Metronidazole Nausea And Vomiting   Nsaids Other (See Comments)    Have to be careful due to liver    Indication: Patient presents with symptomatic varicose veins of the left lower extremity.  Procedure: Foam sclerotherapy was performed on  the left lower extremity. Using ultrasound guidance, 5 mL of foam Sotradecol was used to inject the varicosities of the left lower extremity. Compression wraps were placed. The patient tolerated the procedure well.

## 2021-04-01 DIAGNOSIS — M9905 Segmental and somatic dysfunction of pelvic region: Secondary | ICD-10-CM | POA: Diagnosis not present

## 2021-04-01 DIAGNOSIS — M5416 Radiculopathy, lumbar region: Secondary | ICD-10-CM | POA: Diagnosis not present

## 2021-04-01 DIAGNOSIS — M9903 Segmental and somatic dysfunction of lumbar region: Secondary | ICD-10-CM | POA: Diagnosis not present

## 2021-04-01 DIAGNOSIS — M5136 Other intervertebral disc degeneration, lumbar region: Secondary | ICD-10-CM | POA: Diagnosis not present

## 2021-04-06 DIAGNOSIS — M722 Plantar fascial fibromatosis: Secondary | ICD-10-CM | POA: Diagnosis not present

## 2021-04-07 ENCOUNTER — Other Ambulatory Visit: Payer: Self-pay

## 2021-04-07 ENCOUNTER — Ambulatory Visit (INDEPENDENT_AMBULATORY_CARE_PROVIDER_SITE_OTHER): Payer: Medicare Other | Admitting: Vascular Surgery

## 2021-04-07 ENCOUNTER — Encounter (INDEPENDENT_AMBULATORY_CARE_PROVIDER_SITE_OTHER): Payer: Self-pay | Admitting: Vascular Surgery

## 2021-04-07 VITALS — BP 132/87 | HR 66 | Resp 16 | Wt 221.0 lb

## 2021-04-07 DIAGNOSIS — I83812 Varicose veins of left lower extremities with pain: Secondary | ICD-10-CM

## 2021-04-07 NOTE — Progress Notes (Signed)
Theresa Gross is a 65 y.o.female who presents with painful varicose veins of the left leg  Past Medical History:  Diagnosis Date   Cervical cancer [facility])    age 81   Diverticulosis    Dysplastic nevus 11/29/2007   Right back. Severe atypia, extends to margin. Excised: 12/22/2007, margins free.   GERD (gastroesophageal reflux disease)    Hemorrhoids    History of colon polyps    Hyperlipemia    Hypertension    Left sided colitis ([place])    Psoriasis    Taking Otezla   Vitamin D deficiency     Past Surgical History:  Procedure Laterality Date   ABDOMINAL HYSTERECTOMY     BREAST BIOPSY Right 10 plus yrs ago   stereo bx./clip. Benign   CHOLECYSTECTOMY     COLONOSCOPY WITH PROPOFOL N/A 06/16/2016   Procedure: COLONOSCOPY WITH PROPOFOL;  Surgeon: Manya Silvas, MD;  Location: [facility] ENDOSCOPY;  Service: Endoscopy;  Laterality: N/A;   COLONOSCOPY WITH PROPOFOL N/A 12/17/2019   Procedure: COLONOSCOPY WITH PROPOFOL;  Surgeon: Toledo, Benay Pike, MD;  Location: [facility] ENDOSCOPY;  Service: Gastroenterology;  Laterality: N/A;   DIAGNOSTIC LAPAROSCOPY     ESOPHAGOGASTRODUODENOSCOPY (EGD) WITH PROPOFOL N/A 12/17/2019   Procedure: ESOPHAGOGASTRODUODENOSCOPY (EGD) WITH PROPOFOL;  Surgeon: Toledo, Benay Pike, MD;  Location: [facility] ENDOSCOPY;  Service: Gastroenterology;  Laterality: N/A;   EYE SURGERY     GALLBLADDER SURGERY  06/04/2014   UPPER GASTROINTESTINAL ENDOSCOPY      Current Outpatient Medications  Medication Sig Dispense Refill   Apremilast (OTEZLA) 30 MG TABS Take 1 tablet (30 mg total) by mouth 2 (two) times daily. 180 tablet 1   Calcium-Phosphorus-Vitamin D (CITRACAL +D3 PO) Take 2 tablets by mouth daily.     Cholecalciferol (VITAMIN D) 2000 units tablet Take 2,000 Units by mouth daily.     co-enzyme Q-10 30 MG capsule Take 30 mg by mouth daily.     DELZICOL 400 MG CPDR DR capsule Take 2 tablets by mouth 2 (two) times daily. GI Dr  3   lansoprazole (PREVACID) 30 MG capsule Take 1  capsule by mouth daily. GI Doc     lansoprazole (PREVACID) 30 MG capsule Take by mouth.     lisinopril-hydrochlorothiazide (ZESTORETIC) 10-12.5 MG tablet Take 1 tablet by mouth daily. 90 tablet 1   meclizine (ANTIVERT) 25 MG tablet Take 1 tablet (25 mg total) by mouth 3 (three) times daily as needed for dizziness. 90 tablet 0   Multiple Vitamins-Minerals (MULTIVITAMIN WOMEN) TABS Take by mouth.     nystatin-triamcinolone (MYCOLOG II) cream Apply 1 application topically 2 (two) times daily. PRN/ Derm 30 g 1   rosuvastatin (CRESTOR) 20 MG tablet Take 1 tablet (20 mg total) by mouth daily. 90 tablet 1   triamcinolone (KENALOG) 0.1 % Apply 1 application topically as directed. Qd to bid aa itching on back until clear, avoid face, groin, axilla 80 g 1   No current facility-administered medications for this visit.    Allergies  Allergen Reactions   Ciprofloxacin Nausea And Vomiting   Lipitor [Atorvastatin] Other (See Comments)    Body Aches   Metronidazole Nausea And Vomiting   Nsaids Other (See Comments)    Have to be careful due to liver    Indication: Patient presents with symptomatic varicose veins of the left lower extremity.  Procedure: Foam sclerotherapy was performed on the left lower extremity. Using ultrasound guidance, 5 mL of foam Sotradecol was used to inject the varicosities of the  left lower extremity. Compression wraps were placed. The patient tolerated the procedure well.

## 2021-04-15 ENCOUNTER — Other Ambulatory Visit: Payer: Self-pay

## 2021-04-15 ENCOUNTER — Encounter: Payer: Self-pay | Admitting: Family Medicine

## 2021-04-15 ENCOUNTER — Ambulatory Visit (INDEPENDENT_AMBULATORY_CARE_PROVIDER_SITE_OTHER): Payer: Medicare Other | Admitting: Family Medicine

## 2021-04-15 VITALS — BP 124/82 | HR 72 | Ht 66.0 in | Wt 219.0 lb

## 2021-04-15 DIAGNOSIS — I10 Essential (primary) hypertension: Secondary | ICD-10-CM | POA: Diagnosis not present

## 2021-04-15 DIAGNOSIS — H8309 Labyrinthitis, unspecified ear: Secondary | ICD-10-CM | POA: Diagnosis not present

## 2021-04-15 DIAGNOSIS — Z23 Encounter for immunization: Secondary | ICD-10-CM

## 2021-04-15 DIAGNOSIS — E7800 Pure hypercholesterolemia, unspecified: Secondary | ICD-10-CM

## 2021-04-15 MED ORDER — MECLIZINE HCL 25 MG PO TABS: 25.0000 mg | ORAL_TABLET | Freq: Three times a day (TID) | ORAL | 0 refills | Status: DC | PRN

## 2021-04-15 MED ORDER — ROSUVASTATIN CALCIUM 20 MG PO TABS: 20.0000 mg | ORAL_TABLET | Freq: Every day | ORAL | 1 refills | Status: DC

## 2021-04-15 MED ORDER — LISINOPRIL-HYDROCHLOROTHIAZIDE 10-12.5 MG PO TABS
1.0000 | ORAL_TABLET | Freq: Every day | ORAL | 1 refills | Status: DC
Start: 2021-04-15 — End: 2021-10-14

## 2021-04-15 NOTE — Progress Notes (Signed)
Date:  04/15/2021   Name:  Theresa Gross   DOB:  1955/11/22   MRN:  [id number]   Chief Complaint: Hypertension, Hyperlipidemia, Dizziness, pneumonia vacc, and Flu Vaccine  Hypertension This is a chronic problem. The current episode started more than 1 year ago. The problem is controlled. Pertinent negatives include no anxiety, blurred vision, chest pain, headaches, malaise/fatigue, neck pain, palpitations, peripheral edema or shortness of breath.  Hyperlipidemia Pertinent negatives include no chest pain or shortness of breath.  Dizziness Pertinent negatives include no chest pain, headaches or neck pain.    Lab Results  Component Value Date   CREATININE 0.71 06/13/2019   BUN 10 06/13/2019   NA 140 06/13/2019   K 4.4 06/13/2019   CL 104 06/13/2019   CO2 23 06/13/2019   Lab Results  Component Value Date   CHOL 171 04/30/2020   HDL 60 04/30/2020   LDLCALC 84 04/30/2020   TRIG 159 (H) 04/30/2020   CHOLHDL 4.6 (H) 08/23/2017   No results found for: TSH No results found for: HGBA1C Lab Results  Component Value Date   WBC 6.9 05/17/2013   HGB 13.7 05/17/2013   HCT 39.4 05/17/2013   MCV 94 05/17/2013   PLT 241 05/17/2013   Lab Results  Component Value Date   ALT 28 06/13/2019   AST 25 06/13/2019   ALKPHOS 77 06/13/2019   BILITOT 0.5 06/13/2019     Review of Systems  Constitutional:  Negative for malaise/fatigue.  Eyes:  Negative for blurred vision.  Respiratory:  Negative for shortness of breath.   Cardiovascular:  Negative for chest pain and palpitations.  Musculoskeletal:  Negative for neck pain.  Neurological:  Positive for dizziness. Negative for headaches.   Patient Active Problem List   Diagnosis Date Noted   Varicose veins of leg with pain, left 11/04/2020   Varicose veins of leg with pain, right 10/07/2020   Osteopenia 08/07/2020   Class 2 obesity due to excess calories without serious comorbidity with body mass index (BMI) of 37.0 to 37.9 in adult  02/16/2017   Pure hypercholesterolemia 02/16/2017   Aortic atherosclerosis ([place]) 05/25/2016   Steatosis of liver 05/25/2016   Mixed hyperlipidemia 05/25/2016   Essential (primary) hypertension 10/28/2014   Gastro-esophageal reflux disease without esophagitis 10/28/2014   H/O psoriasis 10/28/2014   Decreased potassium in the blood 10/28/2014   Familial multiple lipoprotein-type hyperlipidemia 10/28/2014    Allergies  Allergen Reactions   Ciprofloxacin Nausea And Vomiting   Lipitor [Atorvastatin] Other (See Comments)    Body Aches   Metronidazole Nausea And Vomiting   Nsaids Other (See Comments)    Have to be careful due to liver    Past Surgical History:  Procedure Laterality Date   ABDOMINAL HYSTERECTOMY     BREAST BIOPSY Right 10 plus yrs ago   stereo bx./clip. Benign   CHOLECYSTECTOMY     COLONOSCOPY WITH PROPOFOL N/A 06/16/2016   Procedure: COLONOSCOPY WITH PROPOFOL;  Surgeon: Manya Silvas, MD;  Location: [facility] ENDOSCOPY;  Service: Endoscopy;  Laterality: N/A;   COLONOSCOPY WITH PROPOFOL N/A 12/17/2019   Procedure: COLONOSCOPY WITH PROPOFOL;  Surgeon: Toledo, Benay Pike, MD;  Location: [facility] ENDOSCOPY;  Service: Gastroenterology;  Laterality: N/A;   DIAGNOSTIC LAPAROSCOPY     ESOPHAGOGASTRODUODENOSCOPY (EGD) WITH PROPOFOL N/A 12/17/2019   Procedure: ESOPHAGOGASTRODUODENOSCOPY (EGD) WITH PROPOFOL;  Surgeon: Toledo, Benay Pike, MD;  Location: [facility] ENDOSCOPY;  Service: Gastroenterology;  Laterality: N/A;   EYE SURGERY     GALLBLADDER SURGERY  06/04/2014   UPPER GASTROINTESTINAL ENDOSCOPY      Social History   Tobacco Use   Smoking status: Former   Smokeless tobacco: Never  Vaping Use   Vaping Use: Never used  Substance Use Topics   Alcohol use: Yes    Alcohol/week: 1.0 standard drink    Types: 1 Cans of beer per week   Drug use: No     Medication list has been reviewed and updated.  Current Meds  Medication Sig   Apremilast (OTEZLA) 30 MG TABS Take 1 tablet (30  mg total) by mouth 2 (two) times daily.   Calcium-Phosphorus-Vitamin D (CITRACAL +D3 PO) Take 2 tablets by mouth daily.   Cholecalciferol (VITAMIN D) 2000 units tablet Take 2,000 Units by mouth daily.   co-enzyme Q-10 30 MG capsule Take 30 mg by mouth daily.   DELZICOL 400 MG CPDR DR capsule Take 2 tablets by mouth 2 (two) times daily. GI Dr   lansoprazole (PREVACID) 30 MG capsule Take 1 capsule by mouth daily. GI Doc   lansoprazole (PREVACID) 30 MG capsule Take by mouth.   lisinopril-hydrochlorothiazide (ZESTORETIC) 10-12.5 MG tablet Take 1 tablet by mouth daily.   meclizine (ANTIVERT) 25 MG tablet Take 1 tablet (25 mg total) by mouth 3 (three) times daily as needed for dizziness.   Multiple Vitamins-Minerals (MULTIVITAMIN WOMEN) TABS Take by mouth.   nystatin-triamcinolone (MYCOLOG II) cream Apply 1 application topically 2 (two) times daily. PRN/ Derm   rosuvastatin (CRESTOR) 20 MG tablet Take 1 tablet (20 mg total) by mouth daily.   triamcinolone (KENALOG) 0.1 % Apply 1 application topically as directed. Qd to bid aa itching on back until clear, avoid face, groin, axilla    PHQ 2/9 Scores 04/15/2021 10/22/2020 08/20/2020 07/29/2020  PHQ - 2 Score 0 0 0 0  PHQ- 9 Score 0 0 - 1    GAD 7 : Generalized Anxiety Score 04/15/2021 10/22/2020 07/29/2020 04/30/2020  Nervous, Anxious, on Edge 0 0 0 0  Control/stop worrying 0 0 0 0  Worry too much - different things 0 0 0 0  Trouble relaxing 0 0 0 0  Restless 0 0 0 0  Easily annoyed or irritable 0 0 0 0  Afraid - awful might happen 0 0 0 0  Total GAD 7 Score 0 0 0 0    BP Readings from Last 3 Encounters:  04/07/21 132/87  03/10/21 133/84  02/20/21 129/81    Physical Exam  Wt Readings from Last 3 Encounters:  04/15/21 219 lb (99.3 kg)  04/07/21 221 lb (100.2 kg)  03/10/21 223 lb 6.4 oz (101.3 kg)    Ht 5\' 6"  (1.676 m)   Wt 219 lb (99.3 kg)   BMI 35.35 kg/m   Assessment and Plan:

## 2021-04-15 NOTE — Progress Notes (Signed)
Date:  04/15/2021   Name:  Theresa Gross   DOB:  1956-03-23   MRN:  [id number]   Chief Complaint: Hypertension, Hyperlipidemia, Dizziness, pneumonia vacc, and Flu Vaccine  Hypertension This is a chronic problem. The current episode started more than 1 year ago. The problem has been gradually improving since onset. The problem is controlled. Associated symptoms include shortness of breath. Pertinent negatives include no anxiety, blurred vision, chest pain, headaches, malaise/fatigue, neck pain, orthopnea, palpitations, peripheral edema, PND or sweats. There are no associated agents to hypertension. Risk factors for coronary artery disease include dyslipidemia. Past treatments include ACE inhibitors and diuretics. The current treatment provides moderate improvement. There are no compliance problems.  There is no history of angina, kidney disease, CAD/MI, CVA, heart failure, left ventricular hypertrophy, PVD or retinopathy. There is no history of chronic renal disease, a hypertension causing med or renovascular disease.  Hyperlipidemia This is a chronic problem. The current episode started more than 1 year ago. The problem is controlled. Recent lipid tests were reviewed and are normal. Exacerbating diseases include obesity. She has no history of chronic renal disease or diabetes. There are no known factors aggravating her hyperlipidemia. Associated symptoms include shortness of breath. Pertinent negatives include no chest pain or myalgias. Current antihyperlipidemic treatment includes statins. The current treatment provides moderate improvement of lipids. Risk factors for coronary artery disease include dyslipidemia.  Dizziness This is a recurrent problem. The current episode started more than 1 year ago. The problem has been waxing and waning. Pertinent negatives include no abdominal pain, chest pain, chills, coughing, fever, headaches, myalgias, nausea, neck pain, rash or sore throat.   Lab  Results  Component Value Date   CREATININE 0.71 06/13/2019   BUN 10 06/13/2019   NA 140 06/13/2019   K 4.4 06/13/2019   CL 104 06/13/2019   CO2 23 06/13/2019   Lab Results  Component Value Date   CHOL 171 04/30/2020   HDL 60 04/30/2020   LDLCALC 84 04/30/2020   TRIG 159 (H) 04/30/2020   CHOLHDL 4.6 (H) 08/23/2017   No results found for: TSH No results found for: HGBA1C Lab Results  Component Value Date   WBC 6.9 05/17/2013   HGB 13.7 05/17/2013   HCT 39.4 05/17/2013   MCV 94 05/17/2013   PLT 241 05/17/2013   Lab Results  Component Value Date   ALT 28 06/13/2019   AST 25 06/13/2019   ALKPHOS 77 06/13/2019   BILITOT 0.5 06/13/2019     Review of Systems  Constitutional:  Negative for chills, fever and malaise/fatigue.  HENT:  Negative for drooling, ear discharge, ear pain, mouth sores, sore throat and tinnitus.   Eyes:  Negative for blurred vision.  Respiratory:  Positive for shortness of breath. Negative for cough and wheezing.   Cardiovascular:  Negative for chest pain, palpitations, orthopnea, leg swelling and PND.  Gastrointestinal:  Negative for abdominal pain, blood in stool, constipation, diarrhea and nausea.  Endocrine: Negative for polydipsia.  Genitourinary:  Negative for dysuria, frequency, hematuria and urgency.  Musculoskeletal:  Negative for back pain, myalgias and neck pain.  Skin:  Negative for rash.  Allergic/Immunologic: Negative for environmental allergies.  Neurological:  Positive for dizziness. Negative for headaches.       Vertigo  Hematological:  Does not bruise/bleed easily.  Psychiatric/Behavioral:  Negative for suicidal ideas. The patient is not nervous/anxious.    Patient Active Problem List   Diagnosis Date Noted   Varicose veins of  leg with pain, left 11/04/2020   Varicose veins of leg with pain, right 10/07/2020   Osteopenia 08/07/2020   Class 2 obesity due to excess calories without serious comorbidity with body mass index (BMI) of  37.0 to 37.9 in adult 02/16/2017   Pure hypercholesterolemia 02/16/2017   Aortic atherosclerosis ([place]) 05/25/2016   Steatosis of liver 05/25/2016   Mixed hyperlipidemia 05/25/2016   Essential (primary) hypertension 10/28/2014   Gastro-esophageal reflux disease without esophagitis 10/28/2014   H/O psoriasis 10/28/2014   Decreased potassium in the blood 10/28/2014   Familial multiple lipoprotein-type hyperlipidemia 10/28/2014    Allergies  Allergen Reactions   Ciprofloxacin Nausea And Vomiting   Lipitor [Atorvastatin] Other (See Comments)    Body Aches   Metronidazole Nausea And Vomiting   Nsaids Other (See Comments)    Have to be careful due to liver    Past Surgical History:  Procedure Laterality Date   ABDOMINAL HYSTERECTOMY     BREAST BIOPSY Right 10 plus yrs ago   stereo bx./clip. Benign   CHOLECYSTECTOMY     COLONOSCOPY WITH PROPOFOL N/A 06/16/2016   Procedure: COLONOSCOPY WITH PROPOFOL;  Surgeon: Manya Silvas, MD;  Location: [facility] ENDOSCOPY;  Service: Endoscopy;  Laterality: N/A;   COLONOSCOPY WITH PROPOFOL N/A 12/17/2019   Procedure: COLONOSCOPY WITH PROPOFOL;  Surgeon: Toledo, Benay Pike, MD;  Location: [facility] ENDOSCOPY;  Service: Gastroenterology;  Laterality: N/A;   DIAGNOSTIC LAPAROSCOPY     ESOPHAGOGASTRODUODENOSCOPY (EGD) WITH PROPOFOL N/A 12/17/2019   Procedure: ESOPHAGOGASTRODUODENOSCOPY (EGD) WITH PROPOFOL;  Surgeon: Toledo, Benay Pike, MD;  Location: [facility] ENDOSCOPY;  Service: Gastroenterology;  Laterality: N/A;   EYE SURGERY     GALLBLADDER SURGERY  06/04/2014   UPPER GASTROINTESTINAL ENDOSCOPY      Social History   Tobacco Use   Smoking status: Former   Smokeless tobacco: Never  Vaping Use   Vaping Use: Never used  Substance Use Topics   Alcohol use: Yes    Alcohol/week: 1.0 standard drink    Types: 1 Cans of beer per week   Drug use: No     Medication list has been reviewed and updated.  Current Meds  Medication Sig   Apremilast (OTEZLA) 30 MG  TABS Take 1 tablet (30 mg total) by mouth 2 (two) times daily.   Calcium-Phosphorus-Vitamin D (CITRACAL +D3 PO) Take 2 tablets by mouth daily.   Cholecalciferol (VITAMIN D) 2000 units tablet Take 2,000 Units by mouth daily.   co-enzyme Q-10 30 MG capsule Take 30 mg by mouth daily.   DELZICOL 400 MG CPDR DR capsule Take 2 tablets by mouth 2 (two) times daily. GI Dr   lansoprazole (PREVACID) 30 MG capsule Take 1 capsule by mouth daily. GI Doc   lansoprazole (PREVACID) 30 MG capsule Take by mouth.   lisinopril-hydrochlorothiazide (ZESTORETIC) 10-12.5 MG tablet Take 1 tablet by mouth daily.   meclizine (ANTIVERT) 25 MG tablet Take 1 tablet (25 mg total) by mouth 3 (three) times daily as needed for dizziness.   Multiple Vitamins-Minerals (MULTIVITAMIN WOMEN) TABS Take by mouth.   nystatin-triamcinolone (MYCOLOG II) cream Apply 1 application topically 2 (two) times daily. PRN/ Derm   rosuvastatin (CRESTOR) 20 MG tablet Take 1 tablet (20 mg total) by mouth daily.   triamcinolone (KENALOG) 0.1 % Apply 1 application topically as directed. Qd to bid aa itching on back until clear, avoid face, groin, axilla    PHQ 2/9 Scores 04/15/2021 10/22/2020 08/20/2020 07/29/2020  PHQ - 2 Score 0 0 0 0  PHQ-  9 Score 0 0 - 1    GAD 7 : Generalized Anxiety Score 04/15/2021 10/22/2020 07/29/2020 04/30/2020  Nervous, Anxious, on Edge 0 0 0 0  Control/stop worrying 0 0 0 0  Worry too much - different things 0 0 0 0  Trouble relaxing 0 0 0 0  Restless 0 0 0 0  Easily annoyed or irritable 0 0 0 0  Afraid - awful might happen 0 0 0 0  Total GAD 7 Score 0 0 0 0    BP Readings from Last 3 Encounters:  04/15/21 124/82  04/07/21 132/87  03/10/21 133/84    Physical Exam Vitals and nursing note reviewed.  Constitutional:      General: She is not in acute distress.    Appearance: She is not diaphoretic.  HENT:     Head: Normocephalic and atraumatic.     Right Ear: Tympanic membrane, ear canal and external ear  normal. There is no impacted cerumen.     Left Ear: Tympanic membrane, ear canal and external ear normal. There is no impacted cerumen.     Nose: Nose normal. No congestion or rhinorrhea.  Eyes:     General:        Right eye: No discharge.        Left eye: No discharge.     Conjunctiva/sclera: Conjunctivae normal.     Pupils: Pupils are equal, round, and reactive to light.  Neck:     Thyroid: No thyromegaly.     Vascular: No carotid bruit or JVD.  Cardiovascular:     Rate and Rhythm: Normal rate and regular rhythm.     Heart sounds: Normal heart sounds. No murmur heard.   No friction rub. No gallop.  Pulmonary:     Effort: Pulmonary effort is normal.     Breath sounds: Normal breath sounds. No wheezing or rhonchi.  Abdominal:     General: Bowel sounds are normal.     Palpations: Abdomen is soft. There is no mass.     Tenderness: There is no abdominal tenderness. There is no guarding.  Musculoskeletal:        General: Normal range of motion.     Cervical back: Normal range of motion and neck supple.  Lymphadenopathy:     Cervical: No cervical adenopathy.  Skin:    General: Skin is warm and dry.     Capillary Refill: Capillary refill takes less than 2 seconds.     Findings: No bruising or erythema.  Neurological:     Mental Status: She is alert.     Deep Tendon Reflexes: Reflexes are normal and symmetric.    Wt Readings from Last 3 Encounters:  04/15/21 219 lb (99.3 kg)  04/07/21 221 lb (100.2 kg)  03/10/21 223 lb 6.4 oz (101.3 kg)    BP 124/82   Pulse 72   Ht 5\' 6"  (1.676 m)   Wt 219 lb (99.3 kg)   BMI 35.35 kg/m   Assessment and Plan:  1. Essential (primary) hypertension Chronic.  Controlled.  Stable.  Blood pressure 124/82.  Continue lisinopril hydrochlorothiazide 10-12.5 mg once a day.  Review of care everywhere labs is sufficient for current renal function and electrolytes. - lisinopril-hydrochlorothiazide (ZESTORETIC) 10-12.5 MG tablet; Take 1 tablet by mouth  daily.  Dispense: 90 tablet; Refill: 1  2. Pure hypercholesterolemia Chronic.  Controlled.  Stable.  Continue rosuvastatin 20 mg once a day. - rosuvastatin (CRESTOR) 20 MG tablet; Take 1 tablet (20 mg total) by  mouth daily.  Dispense: 90 tablet; Refill: 1 - Lipid Panel With LDL/HDL Ratio  3. Labyrinthitis, unspecified laterality Chronic.  Recurrent.  Episodic.  Controlled with as needed meclizine 25 mg up to 8 hours a day. - meclizine (ANTIVERT) 25 MG tablet; Take 1 tablet (25 mg total) by mouth 3 (three) times daily as needed for dizziness.  Dispense: 90 tablet; Refill: 0

## 2021-04-16 LAB — LIPID PANEL WITH LDL/HDL RATIO
Cholesterol, Total: 167 mg/dL (ref 100–199)
HDL: 61 mg/dL (ref >39)
LDL Chol Calc (NIH): 82 mg/dL (ref 0–99)
LDL/HDL Ratio: 1.3 ratio (ref 0.0–3.2)
Triglycerides: 140 mg/dL (ref 0–149)
VLDL Cholesterol Cal: 24 mg/dL (ref 5–40)

## 2021-04-29 DIAGNOSIS — M9905 Segmental and somatic dysfunction of pelvic region: Secondary | ICD-10-CM | POA: Diagnosis not present

## 2021-04-29 DIAGNOSIS — M5416 Radiculopathy, lumbar region: Secondary | ICD-10-CM | POA: Diagnosis not present

## 2021-04-29 DIAGNOSIS — M9903 Segmental and somatic dysfunction of lumbar region: Secondary | ICD-10-CM | POA: Diagnosis not present

## 2021-04-29 DIAGNOSIS — M5136 Other intervertebral disc degeneration, lumbar region: Secondary | ICD-10-CM | POA: Diagnosis not present

## 2021-05-25 DIAGNOSIS — M5136 Other intervertebral disc degeneration, lumbar region: Secondary | ICD-10-CM | POA: Diagnosis not present

## 2021-05-25 DIAGNOSIS — M9903 Segmental and somatic dysfunction of lumbar region: Secondary | ICD-10-CM | POA: Diagnosis not present

## 2021-05-25 DIAGNOSIS — M9905 Segmental and somatic dysfunction of pelvic region: Secondary | ICD-10-CM | POA: Diagnosis not present

## 2021-05-25 DIAGNOSIS — M5416 Radiculopathy, lumbar region: Secondary | ICD-10-CM | POA: Diagnosis not present

## 2021-06-17 ENCOUNTER — Other Ambulatory Visit: Payer: Self-pay

## 2021-06-17 ENCOUNTER — Ambulatory Visit
Admission: EM | Admit: 2021-06-17 | Discharge: 2021-06-17 | Disposition: A | Discharge status: Home / Self Care | Payer: Medicare Other | Attending: Physician Assistant | Admitting: Physician Assistant

## 2021-06-17 DIAGNOSIS — R450 Nervousness: Secondary | ICD-10-CM | POA: Diagnosis not present

## 2021-06-17 DIAGNOSIS — R42 Dizziness and giddiness: Secondary | ICD-10-CM | POA: Diagnosis not present

## 2021-06-17 NOTE — ED Provider Notes (Signed)
[facility]    CSN: [id number] Arrival date & time: 06/17/21  5093      History   Chief Complaint Chief Complaint  Patient presents with   Dizziness    HPI Theresa Gross is a 65 y.o. female presenting for feeling jittery and lightheaded a couple of hours ago.  She says it has gotten better.  She was concerned that her blood pressure was high.  Patient says that her Apple Watch told her heart rate was elevated at 112 bpm.  She does have a history of BPPV and feels like she might be starting to have a flareup of that.  Has not taken her meclizine but has at home to take.  Patient reports for symptoms started she was trying to send an email and had difficulty.  Unsure if that is what caused her onset of symptoms.  Reports feeling flustered and jittery.  She is no longer feeling that way and feels okay only slightly dizzy when she lays down on the exam bed.  She denies any severe headaches, vision changes, chest pain, palpitations, breathing difficulty or weakness, numbness/tingling, facial droop, presyncope or syncope.  Medical history significant for hypertension, hyperlipidemia and psoriasis.  HPI  Past Medical History:  Diagnosis Date   Cervical cancer [facility])    age 50   Diverticulosis    Dysplastic nevus 11/29/2007   Right back. Severe atypia, extends to margin. Excised: 12/22/2007, margins free.   GERD (gastroesophageal reflux disease)    Hemorrhoids    History of colon polyps    Hyperlipemia    Hypertension    Left sided colitis ([place])    Psoriasis    Taking Otezla   Vitamin D deficiency     Patient Active Problem List   Diagnosis Date Noted   Varicose veins of leg with pain, left 11/04/2020   Varicose veins of leg with pain, right 10/07/2020   Osteopenia 08/07/2020   Class 2 obesity due to excess calories without serious comorbidity with body mass index (BMI) of 37.0 to 37.9 in adult 02/16/2017   Pure hypercholesterolemia 02/16/2017   Aortic  atherosclerosis ([place]) 05/25/2016   Steatosis of liver 05/25/2016   Mixed hyperlipidemia 05/25/2016   Essential (primary) hypertension 10/28/2014   Gastro-esophageal reflux disease without esophagitis 10/28/2014   H/O psoriasis 10/28/2014   Decreased potassium in the blood 10/28/2014   Familial multiple lipoprotein-type hyperlipidemia 10/28/2014    Past Surgical History:  Procedure Laterality Date   ABDOMINAL HYSTERECTOMY     BREAST BIOPSY Right 10 plus yrs ago   stereo bx./clip. Benign   CHOLECYSTECTOMY     COLONOSCOPY WITH PROPOFOL N/A 06/16/2016   Procedure: COLONOSCOPY WITH PROPOFOL;  Surgeon: Manya Silvas, MD;  Location: [facility] ENDOSCOPY;  Service: Endoscopy;  Laterality: N/A;   COLONOSCOPY WITH PROPOFOL N/A 12/17/2019   Procedure: COLONOSCOPY WITH PROPOFOL;  Surgeon: Toledo, Benay Pike, MD;  Location: [facility] ENDOSCOPY;  Service: Gastroenterology;  Laterality: N/A;   DIAGNOSTIC LAPAROSCOPY     ESOPHAGOGASTRODUODENOSCOPY (EGD) WITH PROPOFOL N/A 12/17/2019   Procedure: ESOPHAGOGASTRODUODENOSCOPY (EGD) WITH PROPOFOL;  Surgeon: Toledo, Benay Pike, MD;  Location: [facility] ENDOSCOPY;  Service: Gastroenterology;  Laterality: N/A;   EYE SURGERY     GALLBLADDER SURGERY  06/04/2014   UPPER GASTROINTESTINAL ENDOSCOPY      OB History     Gravida  1   Para  1   Term  1   Preterm      AB      Living  1      SAB      IAB      Ectopic      Multiple      Live Births  1            Home Medications    Prior to Admission medications   Medication Sig Start Date End Date Taking? Authorizing Provider  Apremilast (OTEZLA) 30 MG TABS Take 1 tablet (30 mg total) by mouth 2 (two) times daily. 02/17/21   [name], MD  Calcium-Phosphorus-Vitamin D (CITRACAL +D3 PO) Take 2 tablets by mouth daily.    [provider]  Cholecalciferol (VITAMIN D) 2000 units tablet Take 2,000 Units by mouth daily.    [provider]  co-enzyme Q-10 30 MG capsule Take 30 mg by mouth  daily.    [provider]  DELZICOL 400 MG CPDR DR capsule Take 2 tablets by mouth 2 (two) times daily. GI Dr 12/20/16   [provider]  lansoprazole (PREVACID) 30 MG capsule Take 1 capsule by mouth daily. GI Doc 10/22/14   [provider]  lansoprazole (PREVACID) 30 MG capsule Take by mouth. 01/20/21   [provider]  lisinopril-hydrochlorothiazide (ZESTORETIC) 10-12.5 MG tablet Take 1 tablet by mouth daily. 04/15/21   Juline Patch, MD  meclizine (ANTIVERT) 25 MG tablet Take 1 tablet (25 mg total) by mouth 3 (three) times daily as needed for dizziness. 04/15/21   Juline Patch, MD  Multiple Vitamins-Minerals (MULTIVITAMIN WOMEN) TABS Take by mouth.    [provider]  nystatin-triamcinolone (MYCOLOG II) cream Apply 1 application topically 2 (two) times daily. PRN/ Derm 10/24/19   Juline Patch, MD  rosuvastatin (CRESTOR) 20 MG tablet Take 1 tablet (20 mg total) by mouth daily. 04/15/21   Juline Patch, MD  triamcinolone (KENALOG) 0.1 % Apply 1 application topically as directed. Qd to bid aa itching on back until clear, avoid face, groin, axilla 08/18/20   [name], MD    Family History Family History  Problem Relation Age of Onset   Colon cancer Mother    Diabetes Father    Breast cancer Neg Hx     Social History Social History   Tobacco Use   Smoking status: Former   Smokeless tobacco: Never  Vaping Use   Vaping Use: Never used  Substance Use Topics   Alcohol use: Yes    Alcohol/week: 1.0 standard drink    Types: 1 Cans of beer per week   Drug use: No     Allergies   Ciprofloxacin, Lipitor [atorvastatin], Metronidazole, and Nsaids   Review of Systems Review of Systems  Constitutional:  Negative for fatigue and fever.  HENT:  Negative for congestion.   Eyes:  Negative for photophobia and visual disturbance.  Respiratory:  Negative for cough and shortness of breath.   Cardiovascular:  Positive for palpitations.  Negative for chest pain.  Gastrointestinal:  Negative for abdominal pain, nausea and vomiting.  Neurological:  Positive for dizziness and light-headedness. Negative for tremors, seizures, syncope, facial asymmetry, speech difficulty, weakness, numbness and headaches.  Psychiatric/Behavioral:  Positive for agitation. The patient is nervous/anxious.     Physical Exam Triage Vital Signs ED Triage Vitals  Enc Vitals Group     BP 06/17/21 1907 138/84     Pulse Rate 06/17/21 1907 84     Resp 06/17/21 1907 18     Temp 06/17/21 1907 97.7 F (36.5 C)     Temp Source 06/17/21  1907 Oral     SpO2 06/17/21 1907 100 %     Weight --      Height --      Head Circumference --      Peak Flow --      Pain Score 06/17/21 1906 0     Pain Loc --      Pain Edu? --      Excl. in [place]? --    Orthostatic VS for the past 24 hrs:  BP- Lying Pulse- Lying BP- Sitting Pulse- Sitting BP- Standing at 0 minutes Pulse- Standing at 0 minutes  06/17/21 1921 127/77 72 109/78 80 124/84 85    Updated Vital Signs BP 138/84 (BP Location: Right Arm)    Pulse 84    Temp 97.7 F (36.5 C) (Oral)    Resp 18    SpO2 100%   Physical Exam Vitals and nursing note reviewed.  Constitutional:      General: She is not in acute distress.    Appearance: Normal appearance. She is not ill-appearing or toxic-appearing.  HENT:     Head: Normocephalic and atraumatic.     Right Ear: Tympanic membrane, ear canal and external ear normal.     Left Ear: Tympanic membrane, ear canal and external ear normal.     Nose: Nose normal.     Mouth/Throat:     Mouth: Mucous membranes are moist.     Pharynx: Oropharynx is clear.  Eyes:     General: No scleral icterus.       Right eye: No discharge.        Left eye: No discharge.     Extraocular Movements: Extraocular movements intact.     Conjunctiva/sclera: Conjunctivae normal.     Pupils: Pupils are equal, round, and reactive to light.  Cardiovascular:     Rate and Rhythm: Normal rate  and regular rhythm.     Heart sounds: Normal heart sounds.  Pulmonary:     Effort: Pulmonary effort is normal. No respiratory distress.     Breath sounds: Normal breath sounds.  Musculoskeletal:     Cervical back: Neck supple.  Skin:    General: Skin is dry.  Neurological:     General: No focal deficit present.     Mental Status: She is alert and oriented to person, place, and time. Mental status is at baseline.     Cranial Nerves: No cranial nerve deficit.     Motor: No weakness.     Coordination: Coordination normal.     Gait: Gait normal.     Comments: 5/5 strength bilat upper and lower exts  Psychiatric:        Mood and Affect: Mood normal.        Behavior: Behavior normal.        Thought Content: Thought content normal.     UC Treatments / Results  Labs (all labs ordered are listed, but only abnormal results are displayed) Labs Reviewed - No data to display  EKG   Radiology No results found.  Procedures ED EKG  Date/Time: 06/17/2021 7:35 PM Performed by: Danton Clap, PA-C Authorized by: Danton Clap, PA-C   ECG reviewed by ED Physician in the absence of a cardiologist: yes   Previous ECG:    Previous ECG:  Compared to current   Similarity:  No change Interpretation:    Interpretation: normal   Rate:    ECG rate:  72   ECG rate assessment: normal  Rhythm:    Rhythm: sinus rhythm   Ectopy:    Ectopy: none   QRS:    QRS axis:  Normal   QRS intervals:  Normal   QRS conduction: normal   ST segments:    ST segments:  Normal T waves:    T waves: normal   Comments:     Normal sinus rhythm. Regular rate. (including critical care time)  Medications Ordered in UC Medications - No data to display  Initial Impression / Assessment and Plan / UC Course  I have reviewed the triage vital signs and the nursing notes.  Pertinent labs & imaging results that were available during my care of the patient were reviewed by me and considered in my medical  decision making (see chart for details).  65 year old female presenting for feeling dizzy, jittery and a little bit like her heart was racing earlier today.  Patient reports feeling slightly dizzy now when she lay down exam table, otherwise normal.  Patient reports frustration with trying to send an email before onset of symptoms.  Vitals are all normal and stable today.  Orthostatics negative.  EKG shows normal sinus rhythm and regular rate.  Exam is benign today including cranial nerve exam and patient does have 5 out of 5 strength bilateral upper and lower extremities.  Chest clear to auscultation heart regular rate and rhythm.  Offered to check a CBG but patient declines.  She is satisfied that her vital signs are normal and stable and her EKG is normal.  Advised patient to take her meclizine at home if she still feeling dizzy and to rest and stay hydrated.  Reviewed going to ED for any acute worsening of her symptoms especially if she were to develop any red flag signs or symptoms which I did discuss with her.  Suspect she had a stressful event which caused her symptoms.  She is feeling better now.   Final Clinical Impressions(s) / UC Diagnoses   Final diagnoses:  Dizziness  Jittery feeling     Discharge Instructions      -Your vital signs look great and your EKG is normal. - It is a great sign that you are feeling well now and her symptoms have improved. - Go home and take your meclizine which should help if this is related to positional vertigo. - Rest and increase your fluid intake. - If it happens again, you as we discussed and either distract yourself or lay down and focus on your breathing.  Consider getting a small bite of food and hydrating. - If it is ever accompanied by severe headache, feeling faint or passing out, chest pain, shortness of breath, feel like your heart is racing or fluttering, nausea/vomiting, weakness, etc. you need to go to emergency department.     ED  Prescriptions   None    I have reviewed the PDMP during this encounter.   Danton Clap, PA-C 06/17/21 1956

## 2021-06-17 NOTE — ED Triage Notes (Signed)
Patient presents to Urgent Care with complaints of lightheadedness and jittery that started about an hr ago. She states her apple watch notified her that her heart rate was elevated in the 112 range. She states she was concerned with her blood pressure. She came in for BP check. Has a hx of HTN.   Denies fever, SOB, or chest pain.

## 2021-06-17 NOTE — Discharge Instructions (Signed)
-  Your vital signs look great and your EKG is normal. - It is a great sign that you are feeling well now and her symptoms have improved. - Go home and take your meclizine which should help if this is related to positional vertigo. - Rest and increase your fluid intake. - If it happens again, you as we discussed and either distract yourself or lay down and focus on your breathing.  Consider getting a small bite of food and hydrating. - If it is ever accompanied by severe headache, feeling faint or passing out, chest pain, shortness of breath, feel like your heart is racing or fluttering, nausea/vomiting, weakness, etc. you need to go to emergency department.

## 2021-06-22 ENCOUNTER — Other Ambulatory Visit: Payer: Self-pay

## 2021-06-22 ENCOUNTER — Ambulatory Visit (INDEPENDENT_AMBULATORY_CARE_PROVIDER_SITE_OTHER): Payer: Medicare Other

## 2021-06-22 DIAGNOSIS — Z23 Encounter for immunization: Secondary | ICD-10-CM

## 2021-06-23 DIAGNOSIS — M9905 Segmental and somatic dysfunction of pelvic region: Secondary | ICD-10-CM | POA: Diagnosis not present

## 2021-06-23 DIAGNOSIS — M5416 Radiculopathy, lumbar region: Secondary | ICD-10-CM | POA: Diagnosis not present

## 2021-06-23 DIAGNOSIS — M5136 Other intervertebral disc degeneration, lumbar region: Secondary | ICD-10-CM | POA: Diagnosis not present

## 2021-06-23 DIAGNOSIS — M9903 Segmental and somatic dysfunction of lumbar region: Secondary | ICD-10-CM | POA: Diagnosis not present

## 2021-07-22 DIAGNOSIS — M5136 Other intervertebral disc degeneration, lumbar region: Secondary | ICD-10-CM | POA: Diagnosis not present

## 2021-07-22 DIAGNOSIS — M9903 Segmental and somatic dysfunction of lumbar region: Secondary | ICD-10-CM | POA: Diagnosis not present

## 2021-07-22 DIAGNOSIS — M62830 Muscle spasm of back: Secondary | ICD-10-CM | POA: Diagnosis not present

## 2021-07-22 DIAGNOSIS — M9905 Segmental and somatic dysfunction of pelvic region: Secondary | ICD-10-CM | POA: Diagnosis not present

## 2021-08-18 ENCOUNTER — Ambulatory Visit (INDEPENDENT_AMBULATORY_CARE_PROVIDER_SITE_OTHER): Payer: Medicare Other | Admitting: Dermatology

## 2021-08-18 ENCOUNTER — Other Ambulatory Visit: Payer: Self-pay

## 2021-08-18 DIAGNOSIS — L578 Other skin changes due to chronic exposure to nonionizing radiation: Secondary | ICD-10-CM | POA: Diagnosis not present

## 2021-08-18 DIAGNOSIS — Z79899 Other long term (current) drug therapy: Secondary | ICD-10-CM

## 2021-08-18 DIAGNOSIS — L299 Pruritus, unspecified: Secondary | ICD-10-CM | POA: Diagnosis not present

## 2021-08-18 DIAGNOSIS — Z86018 Personal history of other benign neoplasm: Secondary | ICD-10-CM

## 2021-08-18 DIAGNOSIS — L853 Xerosis cutis: Secondary | ICD-10-CM

## 2021-08-18 DIAGNOSIS — L814 Other melanin hyperpigmentation: Secondary | ICD-10-CM | POA: Diagnosis not present

## 2021-08-18 DIAGNOSIS — L821 Other seborrheic keratosis: Secondary | ICD-10-CM | POA: Diagnosis not present

## 2021-08-18 DIAGNOSIS — L409 Psoriasis, unspecified: Secondary | ICD-10-CM | POA: Diagnosis not present

## 2021-08-18 MED ORDER — TRIAMCINOLONE ACETONIDE 0.1 % EX CREA: 1.0000 "application " | TOPICAL_CREAM | CUTANEOUS | 1 refills | Status: AC

## 2021-08-18 MED ORDER — OTEZLA 30 MG PO TABS: 30.0000 mg | ORAL_TABLET | Freq: Two times a day (BID) | ORAL | 1 refills | Status: DC

## 2021-08-18 MED ORDER — KETOCONAZOLE 2 % EX CREA: TOPICAL_CREAM | CUTANEOUS | 1 refills | Status: AC

## 2021-08-18 MED ORDER — CALCIPOTRIENE-BETAMETH DIPROP 0.005-0.064 % EX SUSP: Freq: Every day | CUTANEOUS | 1 refills | Status: DC

## 2021-08-18 NOTE — Patient Instructions (Addendum)
Side effects of Otezla (apremilast) include diarrhea, nausea, headache, upper respiratory infection, depression, and weight decrease (5-10%). It should only be taken by pregnant women after a discussion regarding risks and benefits with their doctor. Goal is control of skin condition, not cure.  The use of [place] N[place] requires long term medication management, including periodic office visits.  Topical steroids (such as triamcinolone, fluocinolone, fluocinonide, mometasone, clobetasol, halobetasol, betamethasone, hydrocortisone) can cause thinning and lightening of the skin if they are used for too long in the same area. Your physician has selected the right strength medicine for your problem and area affected on the body. Please use your medication only as directed by your physician to prevent side effects.   Dry Skin Care  What causes dry skin?  Dry skin is common and results from inadequate moisture in the outer skin layers. Dry skin usually results from the excessive loss of moisture from the skin surface. This occurs due to two major factors: Normally the skin's oil glands deposit a layer of oil on the skin's surface. This layer of oil prevents the loss of moisture from the skin. Exposure to soaps, cleaners, solvents, and disinfectants removes this oily film, allowing water to escape. Water loss from the skin increases when the humidity is low. During winter months we spend a lot of time indoors where the air is heated. Heated air has very low humidity. This also contributes to dry skin.  A tendency for dry skin may accompany such disorders as eczema. Also, as people age, the number of functioning oil glands decreases, and the tendency toward dry skin can be a sensation of skin tightness when emerging from the shower.  How do I manage dry skin?  Humidify your environment. This can be accomplished by using a humidifier in your bedroom at night during winter months. Bathing can actually put moisture  back into your skin if done right. Take the following steps while bathing to sooth dry skin: Avoid hot water, which only dries the skin and makes itching worse. Use warm water. Avoid washcloths or extensive rubbing or scrubbing. Use mild soaps like unscented Dove, Oil of Olay, Cetaphil, Basis, or CeraVe. If you take baths rather than showers, rinse off soap residue with clean water before getting out of tub. Once out of the shower/tub, pat dry gently with a soft towel. Leave your skin damp. While still damp, apply any medicated ointment/cream you were prescribed to the affected areas. After you apply your medicated ointment/cream, then apply your moisturizer to your whole body.This is the most important step in dry skin care. If this is omitted, your skin will continue to be dry. The choice of moisturizer is also very important. In general, lotion will not provider enough moisture to severely dry skin because it is water based. You should use an ointment or cream. Moisturizers should also be unscented. Good choices include Vaseline (plain petrolatum), Aquaphor, Cetaphil, CeraVe, Vanicream, DML Forte, Aveeno moisture, or Eucerin Cream. Bath oils can be helpful, but do not replace the application of moisturizer after the bath. In addition, they make the tub slippery causing an increased risk for falls. Therefore, we do not recommend their use.  If You Need Anything After Your Visit  If you have any questions or concerns for your doctor, please call our main line at [phone] and press option 4 to reach your doctor's medical assistant. If no one answers, please leave a voicemail as directed and we will return your call as soon as possible.  Messages left after 4 pm will be answered the following business day.   You may also send [place] a message via [place]. We typically respond to MyChart messages within 1-2 business days.  For prescription refills, please ask your pharmacy to contact our office. Our fax  number is [phone].  If you have an urgent issue when the clinic is closed that cannot wait until the next business day, you can page your doctor at the number below.    Please note that while we do our best to be available for urgent issues outside of office hours, we are not available 24/7.   If you have an urgent issue and are unable to reach [place], you may choose to seek medical care at your doctor's office, retail clinic, urgent care center, or emergency room.  If you have a medical emergency, please immediately call 911 or go to the emergency department.  Pager Numbers  - Dr. Nehemiah Massed: [phone]  - Dr. Laurence Ferrari: [phone]  - Dr. Nicole Kindred: [phone]  In the event of inclement weather, please call our main line at [phone] for an update on the status of any delays or closures.  Dermatology Medication Tips: Please keep the boxes that topical medications come in in order to help keep track of the instructions about where and how to use these. Pharmacies typically print the medication instructions only on the boxes and not directly on the medication tubes.   If your medication is too expensive, please contact our office at [phone] option 4 or send [place] a message through [place].   We are unable to tell what your co-pay for medications will be in advance as this is different depending on your insurance coverage. However, we may be able to find a substitute medication at lower cost or fill out paperwork to get insurance to cover a needed medication.   If a prior authorization is required to get your medication covered by your insurance company, please allow [place] 1-2 business days to complete this process.  Drug prices often vary depending on where the prescription is filled and some pharmacies may offer cheaper prices.  The website www.goodrx.com contains coupons for medications through different pharmacies. The prices here do not account for what the cost may be with help  from insurance (it may be cheaper with your insurance), but the website can give you the price if you did not use any insurance.  - You can print the associated coupon and take it with your prescription to the pharmacy.  - You may also stop by our office during regular business hours and pick up a GoodRx coupon card.  - If you need your prescription sent electronically to a different pharmacy, notify our office through [facility] or by phone at [phone] option 4.     Si Usted Necesita Algo Despus de Su Visita  Tambin puede enviarnos un mensaje a travs de Pharmacist, community. Por lo general respondemos a los mensajes de MyChart en el transcurso de 1 a 2 das hbiles.  Para renovar recetas, por favor pida a su farmacia que se ponga en contacto con nuestra oficina. Harland Dingwall de fax es [place] [phone].  Si tiene un asunto urgente cuando la clnica est cerrada y que no puede esperar hasta el siguiente da hbil, puede llamar/localizar a su doctor(a) al nmero que aparece a continuacin.   Por favor, tenga en cuenta que aunque hacemos todo lo posible para estar disponibles para asuntos urgentes fuera del horario de [place], no  estamos disponibles las 24 horas del da, los 7 das de la [place].   Si tiene un problema urgente y no puede comunicarse con nosotros, puede optar por buscar atencin mdica  en el consultorio de su doctor(a), en una clnica privada, en un centro de atencin urgente o en una sala de emergencias.  Si tiene Engineering geologist, por favor llame inmediatamente al 911 o vaya a la sala de emergencias.  Nmeros de bper  - Dr. Nehemiah Massed: [phone]  - Dra. Moye: [phone]  - Dra. Nicole Kindred: [phone]  En caso de inclemencias del [place], por favor llame a Johnsie Kindred principal al [phone] para una actualizacin sobre el [place] de cualquier retraso o cierre.  Consejos para la medicacin en dermatologa: Por favor, guarde las cajas en las que vienen los  medicamentos de uso tpico para ayudarle a seguir las instrucciones sobre dnde y cmo usarlos. Las farmacias generalmente imprimen las instrucciones del medicamento slo en las cajas y no directamente en los tubos del [place].   Si su medicamento es muy caro, por favor, pngase en contacto con Zigmund Daniel llamando al [phone] y presione la opcin 4 o envenos un mensaje a travs de Pharmacist, community.   No podemos decirle cul ser su copago por los medicamentos por adelantado ya que esto es diferente dependiendo de la cobertura de su seguro. Sin embargo, es posible que podamos encontrar un medicamento sustituto a Electrical engineer un formulario para que el seguro cubra el medicamento que se considera necesario.   Si se requiere una autorizacin previa para que su compaa de seguros [place] su medicamento, por favor permtanos de 1 a 2 das hbiles para completar este proceso.  Los precios de los medicamentos varan con frecuencia dependiendo del Environmental consultant de dnde se surte la receta y alguna farmacias pueden ofrecer precios ms baratos.  El sitio web www.goodrx.com tiene cupones para medicamentos de Airline pilot. Los precios aqu no tienen en cuenta lo que podra costar con la ayuda del seguro (puede ser ms barato con su seguro), pero el sitio web puede darle el precio si no utiliz Research scientist (physical sciences).  - Puede imprimir el cupn correspondiente y llevarlo con su receta a la farmacia.  - Tambin puede pasar por nuestra oficina durante el horario de atencin regular y Charity fundraiser una tarjeta de cupones de GoodRx.  - Si necesita que su receta se enve electrnicamente a una farmacia diferente, informe a nuestra oficina a travs de MyChart de [place] o por telfono llamando al [phone] y presione la opcin 4.

## 2021-08-18 NOTE — Progress Notes (Signed)
Follow-Up Visit   Subjective  Theresa Gross is a 66 y.o. female who presents for the following: Psoriasis (Scalp, face, elbows. Controlled with Otezla 30mg  BID, Taclonex solution, and ketoconazole 2% cream.  ). She would also like to have her back checked today. She has a history of severe dysplastic nevus of the right back, excised in 2009. She also has itching on the back.    The following portions of the chart were reviewed this encounter and updated as appropriate:       Review of Systems:  No other skin or systemic complaints except as noted in HPI or Assessment and Plan.  Objective  Well appearing patient in no apparent distress; mood and affect are within normal limits.  A focused examination was performed including face, scalp, back, arms. Relevant physical exam findings are noted in the Assessment and Plan.  Scalp, face, elbows Pink scaly patch of the right occipital scalp; pink scaliness of the right posterior ear; elbows clear  back Mild erythema with xerosis; few healing excoriations    Assessment & Plan  Actinic Damage - chronic, secondary to cumulative UV radiation exposure/sun exposure over time - diffuse scaly erythematous macules with underlying dyspigmentation - Recommend daily broad spectrum sunscreen SPF 30+ to sun-exposed areas, reapply every 2 hours as needed.  - Recommend staying in the shade or wearing long sleeves, sun glasses (UVA+UVB protection) and wide brim hats (4-inch brim around the entire circumference of the hat). - Call for new or changing lesions.  History of Dysplastic Nevus - No evidence of recurrence today of the right back - Recommend regular full body skin exams - Recommend daily broad spectrum sunscreen SPF 30+ to sun-exposed areas, reapply every 2 hours as needed.  - Call if any new or changing lesions are noted between office visits  Lentigines - Scattered tan macules - Due to sun exposure - Benign-appering, observe -  Recommend daily broad spectrum sunscreen SPF 30+ to sun-exposed areas, reapply every 2 hours as needed. - Call for any changes  Psoriasis Scalp, face, elbows  Chronic condition, much improved on Otezla, but not clear  Psoriasis is a chronic non-curable, but treatable genetic/hereditary disease that may have other systemic features affecting other organ systems such as joints (Psoriatic Arthritis). It is associated with an increased risk of inflammatory bowel disease, heart disease, non-alcoholic fatty liver disease, and depression.    Continue Otezla 30mg  take 1 po BID dsp #180 1Rf Continue Taclonex Solution Apply to AA scalp qhs prn dsp 3 mo supply 1Rf Continue Ketoconazole 2% Cream Apply to AA face/ears qd/bid dsp 90g 1Rf May use 1% hydrocortisone cream to AA right posterior ear.  Side effects of Otezla (apremilast) include diarrhea, nausea, headache, upper respiratory infection, depression, and weight decrease (5-10%). It should only be taken by pregnant women after a discussion regarding risks and benefits with their doctor. Goal is control of skin condition, not cure.  The use of [place] requires long term medication management, including periodic office visits.  Topical steroids (such as triamcinolone, fluocinolone, fluocinonide, mometasone, clobetasol, halobetasol, betamethasone, hydrocortisone) can cause thinning and lightening of the skin if they are used for too long in the same area. Your physician has selected the right strength medicine for your problem and area affected on the body. Please use your medication only as directed by your physician to prevent side effects.    calcipotriene-betamethasone (TACLONEX) external suspension - Scalp, face, elbows Apply topically daily.  ketoconazole (NIZORAL) 2 % cream -  Scalp, face, elbows Apply to affected areas psoriasis on face once to twice daily.  Related Medications Apremilast (OTEZLA) 30 MG TABS Take 1 tablet (30 mg total) by  mouth 2 (two) times daily.  Xerosis cutis back  With pruritus  Restart TMC 0.1% Cream apply to itchy spots on back qd/bid prn dsp 80g 1Rf.  Recommend mild soap and moisturizing cream 1-2 times daily.  Gentle skin care handout provided.    triamcinolone cream (KENALOG) 0.1 % - back Apply 1 application topically as directed. Qd to bid aa itching on back until clear, avoid face, groin, axilla  Seborrheic Keratoses - Stuck-on, waxy, tan-brown papules and/or plaques  - Benign-appearing - Discussed benign etiology and prognosis. - Observe - Call for any changes Discussed cosmetic procedure, noncovered.  $60 for 1st lesion and $15 for each additional lesion if done on the same day.  Maximum charge $350.  One touch-up treatment included no charge. Discussed risks of treatment including dyspigmentation, small scar, and/or recurrence. Recommend daily broad spectrum sunscreen SPF 30+/photoprotection to treated areas once healed.   Return in about 6 months (around 02/15/2022) for Psoriasis.  IJamesetta Orleans, CMA, am acting as scribe for [name], MD . Documentation: I have reviewed the above documentation for accuracy and completeness, and I agree with the above.  [name] MD

## 2021-08-20 DIAGNOSIS — M5136 Other intervertebral disc degeneration, lumbar region: Secondary | ICD-10-CM | POA: Diagnosis not present

## 2021-08-20 DIAGNOSIS — M62830 Muscle spasm of back: Secondary | ICD-10-CM | POA: Diagnosis not present

## 2021-08-20 DIAGNOSIS — M9903 Segmental and somatic dysfunction of lumbar region: Secondary | ICD-10-CM | POA: Diagnosis not present

## 2021-08-20 DIAGNOSIS — M9905 Segmental and somatic dysfunction of pelvic region: Secondary | ICD-10-CM | POA: Diagnosis not present

## 2021-08-24 ENCOUNTER — Ambulatory Visit (INDEPENDENT_AMBULATORY_CARE_PROVIDER_SITE_OTHER): Payer: Medicare Other

## 2021-08-24 DIAGNOSIS — Z1231 Encounter for screening mammogram for malignant neoplasm of breast: Secondary | ICD-10-CM

## 2021-08-24 DIAGNOSIS — Z0000 Encounter for general adult medical examination without abnormal findings: Secondary | ICD-10-CM | POA: Diagnosis not present

## 2021-08-24 NOTE — Progress Notes (Signed)
Subjective:   Theresa Gross is a 66 y.o. female who presents for Medicare Annual (Subsequent) preventive examination.  Virtual Visit via Telephone Note  I connected with  Theresa Gross on 08/24/21 at  8:00 AM EST by telephone and verified that I am speaking with the correct person using two identifiers.  Location: Patient: home Provider: [facility] Persons participating in the virtual visit: [place]   I discussed the limitations, risks, security and privacy concerns of performing an evaluation and management service by telephone and the availability of in person appointments. The patient expressed understanding and agreed to proceed.  Interactive audio and video telecommunications were attempted between this nurse and patient, however failed, due to patient having technical difficulties OR patient did not have access to video capability.  We continued and completed visit with audio only.  Some vital signs may be absent or patient reported.   Clemetine Marker, LPN   Review of Systems     Cardiac Risk Factors include: advanced age (>25men, >47 women);dyslipidemia;hypertension;obesity (BMI >30kg/m2)     Objective:    There were no vitals filed for this visit. There is no height or weight on file to calculate BMI.  Advanced Directives 08/24/2021 08/20/2020 12/17/2019 02/21/2018 06/16/2016  Does Patient Have a Medical Advance Directive? Yes Yes Yes Yes Yes  Type of Paramedic of [place];Living will [place];Living will [place];Living will [place];Living will Out of facility DNR (pink MOST or yellow form)  Copy of [place] in Chart? No - copy requested No - copy requested Yes - validated most recent copy scanned in chart (See row information) - -    Current Medications (verified) Outpatient Encounter Medications as of 08/24/2021  Medication Sig   Apremilast (OTEZLA)  30 MG TABS Take 1 tablet (30 mg total) by mouth 2 (two) times daily.   calcipotriene-betamethasone (TACLONEX) external suspension Apply topically daily.   Calcium-Phosphorus-Vitamin D (CITRACAL +D3 PO) Take 2 tablets by mouth daily.   Cholecalciferol (VITAMIN D) 2000 units tablet Take 2,000 Units by mouth daily.   co-enzyme Q-10 30 MG capsule Take 30 mg by mouth daily.   DELZICOL 400 MG CPDR DR capsule Take 2 tablets by mouth 2 (two) times daily. GI Dr   ketoconazole (NIZORAL) 2 % cream Apply to affected areas psoriasis on face once to twice daily.   lansoprazole (PREVACID) 30 MG capsule Take 1 capsule by mouth daily. GI Doc   lisinopril-hydrochlorothiazide (ZESTORETIC) 10-12.5 MG tablet Take 1 tablet by mouth daily.   meclizine (ANTIVERT) 25 MG tablet Take 1 tablet (25 mg total) by mouth 3 (three) times daily as needed for dizziness.   Multiple Vitamins-Minerals (MULTIVITAMIN WOMEN) TABS Take by mouth.   nystatin-triamcinolone (MYCOLOG II) cream Apply 1 application topically 2 (two) times daily. PRN/ Derm   rosuvastatin (CRESTOR) 20 MG tablet Take 1 tablet (20 mg total) by mouth daily.   triamcinolone cream (KENALOG) 0.1 % Apply 1 application topically as directed. Qd to bid aa itching on back until clear, avoid face, groin, axilla   [DISCONTINUED] lansoprazole (PREVACID) 30 MG capsule Take by mouth.   No facility-administered encounter medications on file as of 08/24/2021.    Allergies (verified) Ciprofloxacin, Lipitor [atorvastatin], Metronidazole, and Nsaids   History: Past Medical History:  Diagnosis Date   Cervical cancer [facility])    age 52   Diverticulosis    Dysplastic nevus 11/29/2007   Right back. Severe atypia,  extends to margin. Excised: 12/22/2007, margins free.   GERD (gastroesophageal reflux disease)    Hemorrhoids    History of colon polyps    Hyperlipemia    Hypertension    Left sided colitis ([place])    Psoriasis    Taking Otezla   Vitamin D deficiency    Past  Surgical History:  Procedure Laterality Date   ABDOMINAL HYSTERECTOMY     BREAST BIOPSY Right 10 plus yrs ago   stereo bx./clip. Benign   CHOLECYSTECTOMY     COLONOSCOPY WITH PROPOFOL N/A 06/16/2016   Procedure: COLONOSCOPY WITH PROPOFOL;  Surgeon: Manya Silvas, MD;  Location[facility] ENDOSCOPY;  Service: Endoscopy;  Laterality: N/A;   COLONOSCOPY WITH PROPOFOL N/A 12/17/2019   Procedure: COLONOSCOPY WITH PROPOFOL;  Surgeon: Toledo, Benay Pike, MD;  Location: [facility] ENDOSCOPY;  Service: Gastroenterology;  Laterality: N/A;   DIAGNOSTIC LAPAROSCOPY     ESOPHAGOGASTRODUODENOSCOPY (EGD) WITH PROPOFOL N/A 12/17/2019   Procedure: ESOPHAGOGASTRODUODENOSCOPY (EGD) WITH PROPOFOL;  Surgeon: Toledo, Benay Pike, MD;  Location: [facility] ENDOSCOPY;  Service: Gastroenterology;  Laterality: N/A;   EYE SURGERY     GALLBLADDER SURGERY  06/04/2014   UPPER GASTROINTESTINAL ENDOSCOPY     Family History  Problem Relation Age of Onset   Colon cancer Mother    Diabetes Father    Breast cancer Neg Hx    Social History   Socioeconomic History   Marital status: Married    Spouse name: Not on file   Number of children: 1   Years of education: Not on file   Highest education level: Not on file  Occupational History   Occupation: retired  Tobacco Use   Smoking status: Former    Types: Cigarettes   Smokeless tobacco: Never  Vaping Use   Vaping Use: Never used  Substance and Sexual Activity   Alcohol use: Yes    Alcohol/week: 1.0 standard drink    Types: 1 Cans of beer per week   Drug use: No   Sexual activity: Yes    Birth control/protection: Surgical  Other Topics Concern   Not on file  Social History Narrative   Not on file   Social Determinants of Health   Financial Resource Strain: Low Risk    Difficulty of Paying Living Expenses: Not hard at all  Food Insecurity: No Food Insecurity   Worried About Charity fundraiser in the Last Year: Never true   [place] in the Last Year: Never true   Transportation Needs: No Transportation Needs   Lack of Transportation (Medical): No   Lack of Transportation (Non-Medical): No  Physical Activity: Inactive   Days of Exercise per Week: 0 days   Minutes of Exercise per Session: 0 min  Stress: No Stress Concern Present   Feeling of Stress : Not at all  Social Connections: Moderately Integrated   Frequency of Communication with Friends and Family: Not on file   Frequency of Social Gatherings with Friends and Family: More than three times a week   Attends Religious Services: More than 4 times per year   Active Member of [employer] or Organizations: No   Attends Music therapist: Never   Marital Status: Married    Tobacco Counseling Counseling given: Not Answered   Clinical Intake:  Pre-visit preparation completed: Yes  Pain : No/denies pain     Diabetes: No  How often do you need to have someone help you when you read instructions, pamphlets, or other written materials from  your doctor or pharmacy?: 1 - Never    Interpreter Needed?: No  Information entered by :: Clemetine Marker LPN   Activities of Daily Living In your present state of health, do you have any difficulty performing the following activities: 08/24/2021  Hearing? N  Vision? N  Difficulty concentrating or making decisions? N  Walking or climbing stairs? N  Dressing or bathing? N  Doing errands, shopping? N  Preparing Food and eating ? N  Using the Toilet? N  In the past six months, have you accidently leaked urine? N  Do you have problems with loss of bowel control? N  Managing your Medications? N  Managing your Finances? N  Housekeeping or managing your Housekeeping? N  Some recent data might be hidden    Patient Care Team: Juline Patch, MD as PCP - General (Family Medicine) Iris Pert, [place] as Referring Physician (Chiropractic Medicine) Algernon Huxley, MD as Referring Physician (Vascular Surgery) Samara Deist, DPM as Referring Physician  (Podiatry) Reeves Forth (Gastroenterology) [name], MD (Dermatology) Leandrew Koyanagi, MD as Referring Physician (Ophthalmology)  Indicate any recent Medical Services you may have received from other than [facility] providers in the past year (date may be approximate).     Assessment:   This is a routine wellness examination for [place].  Hearing/Vision screen Hearing Screening - Comments:: Pt denies hearing difficulty Vision Screening - Comments:: Annual vision screenings done at [facility] Dr. Wallace Going  Dietary issues and exercise activities discussed: Current Exercise Habits: The patient does not participate in regular exercise at present, Exercise limited by: orthopedic condition(s)   Goals Addressed             This Visit's Progress    DIET - INCREASE WATER INTAKE   On track    Recommend drinking 6-8 glasses of water per day        Depression Screen [facility] 2/9 Scores 08/24/2021 04/15/2021 10/22/2020 08/20/2020 07/29/2020 04/30/2020 10/24/2019  PHQ - 2 Score 0 0 0 0 0 0 0  PHQ- 9 Score - 0 0 - 1 0 0    Fall Risk Fall Risk  08/24/2021 08/20/2020 07/29/2020 04/30/2020 10/24/2019  Falls in the past year? 0 0 0 0 0  Number falls in past yr: 0 0 - - -  Injury with Fall? 0 0 - - -  Comment - - - - -  Risk for fall due to : No Fall Risks No Fall Risks - - -  Follow up Falls prevention discussed Falls prevention discussed Falls evaluation completed Falls evaluation completed Falls evaluation completed    [place]:  Any stairs in or around the home? Yes  If so, are there any without handrails? No  Home free of loose throw rugs in walkways, pet beds, electrical cords, etc? Yes  Adequate lighting in your home to reduce risk of falls? Yes   ASSISTIVE DEVICES UTILIZED TO PREVENT FALLS:  Life alert? No  Use of a cane, walker or w/c? No  Grab bars in the bathroom? Yes  Shower chair or bench in shower? Yes  Elevated  toilet seat or a handicapped toilet? No   TIMED UP AND GO:  Was the test performed? No . Telephonic visit.   Cognitive Function: Normal cognitive status assessed by direct observation by this Nurse Health Advisor. No abnormalities found.          Immunizations Immunization History  Administered Date(s) Administered   Fluad Quad(high  Dose 65+) 04/25/2019, 04/15/2021   Influenza,inj,Quad PF,6+ Mos 02/16/2017, 04/17/2018   Influenza-Unspecified 04/18/2014, 04/07/2020   PFIZER(Purple Top)SARS-COV-2 Vaccination 08/25/2019, 09/15/2019, 06/26/2020   PNEUMOCOCCAL CONJUGATE-20 04/15/2021   Zoster Recombinat (Shingrix) 04/07/2020, 06/22/2021    TDAP status: Due, Education has been provided regarding the importance of this vaccine. Advised may receive this vaccine at local pharmacy or Health Dept. Aware to provide a copy of the vaccination record if obtained from local pharmacy or Health Dept. Verbalized acceptance and understanding.  Flu Vaccine status: Up to date  Pneumococcal vaccine status: Up to date  Covid-19 vaccine status: Completed vaccines  Qualifies for Shingles Vaccine? Yes   Zostavax completed No   Shingrix Completed?: Yes  Screening Tests Health Maintenance  Topic Date Due   COVID-19 Vaccine (4 - Booster for Pfizer series) 08/21/2020   MAMMOGRAM  09/12/2022   COLONOSCOPY (Pts 45-52yrs Insurance coverage will need to be confirmed)  12/16/2024   TETANUS/TDAP  05/05/2026   Pneumonia Vaccine 71+ Years old  Completed   INFLUENZA VACCINE  Completed   DEXA SCAN  Completed   Hepatitis C Screening  Completed   Zoster Vaccines- Shingrix  Completed   HPV VACCINES  Aged Out    Health Maintenance  Health Maintenance Due  Topic Date Due   COVID-19 Vaccine (4 - Booster for Lely series) 08/21/2020    Colorectal cancer screening: Type of screening: Colonoscopy. Completed 12/17/19. Repeat every 5 years  Mammogram status: Completed 09/11/20. Repeat every year. Ordered  today.   Bone Density status: Completed 07/14/20. Results reflect: Bone density results: OSTEOPENIA. Repeat every 2 years.  Lung Cancer Screening: (Low Dose CT Chest recommended if Age 76-80 years, 30 pack-year currently smoking OR have quit w/in 15years.) does not qualify.   Additional Screening:  Hepatitis C Screening: does qualify; Completed 05/25/16  Vision Screening: Recommended annual ophthalmology exams for early detection of glaucoma and other disorders of the eye. Is the patient up to date with their annual eye exam?  Yes  Who is the provider or what is the name of the office in which the patient attends annual eye exams? [facility].   Dental Screening: Recommended annual dental exams for proper oral hygiene  Community Resource Referral / Chronic Care Management: CRR required this visit?  No   CCM required this visit?  No      Plan:     I have personally reviewed and noted the following in the patients chart:   Medical and social history Use of alcohol, tobacco or illicit drugs  Current medications and supplements including opioid prescriptions.  Functional ability and status Nutritional status Physical activity Advanced directives List of other physicians Hospitalizations, surgeries, and ER visits in previous 12 months Vitals Screenings to include cognitive, depression, and falls Referrals and appointments  In addition, I have reviewed and discussed with patient certain preventive protocols, quality metrics, and best practice recommendations. A written personalized care plan for preventive services as well as general preventive health recommendations were provided to patient.     Clemetine Marker, LPN   9/32/[id number]   Nurse Notes: none

## 2021-08-24 NOTE — Patient Instructions (Signed)
Theresa Gross , Thank you for taking time to come for your Medicare Wellness Visit. I appreciate your ongoing commitment to your health goals. Please review the following plan we discussed and let me know if I can assist you in the future.   Screening recommendations/referrals: Colonoscopy: 12/17/19. Repeat 12/2024 Mammogram: done 09/11/20. Please call [phone] to schedule your mammogram. Bone Density: done 07/14/20 Recommended yearly ophthalmology/optometry visit for glaucoma screening and checkup Recommended yearly dental visit for hygiene and checkup  Vaccinations: Influenza vaccine: done 04/15/21 Pneumococcal vaccine: done 04/15/21 Tdap vaccine: due Shingles vaccine: done 04/07/20 & 06/22/21   Covid-19:done 08/25/19, 09/15/19 & 06/26/20  Advanced directives: Please bring a copy of your health care power of attorney and living will to the office at your convenience.   Conditions/risks identified: Recommend increasing physical activity as tolerated  Next appointment: Follow up in one year for your annual wellness visit    Preventive Care 65 Years and Older, Female Preventive care refers to lifestyle choices and visits with your health care provider that can promote health and wellness. What does preventive care include? A yearly physical exam. This is also called an annual well check. Dental exams once or twice a year. Routine eye exams. Ask your health care provider how often you should have your eyes checked. Personal lifestyle choices, including: Daily care of your teeth and gums. Regular physical activity. Eating a healthy diet. Avoiding tobacco and drug use. Limiting alcohol use. Practicing safe sex. Taking low-dose aspirin every day. Taking vitamin and mineral supplements as recommended by your health care provider. What happens during an annual well check? The services and screenings done by your health care provider during your annual well check will depend on your age,  overall health, lifestyle risk factors, and family history of disease. Counseling  Your health care provider may ask you questions about your: Alcohol use. Tobacco use. Drug use. Emotional well-being. Home and relationship well-being. Sexual activity. Eating habits. History of falls. Memory and ability to understand (cognition). Work and work Statistician. Reproductive health. Screening  You may have the following tests or measurements: Height, weight, and BMI. Blood pressure. Lipid and cholesterol levels. These may be checked every 5 years, or more frequently if you are over 20 years old. Skin check. Lung cancer screening. You may have this screening every year starting at age 42 if you have a 30-pack-year history of smoking and currently smoke or have quit within the past 15 years. Fecal occult blood test (FOBT) of the stool. You may have this test every year starting at age 38. Flexible sigmoidoscopy or colonoscopy. You may have a sigmoidoscopy every 5 years or a colonoscopy every 10 years starting at age 16. Hepatitis C blood test. Hepatitis B blood test. Sexually transmitted disease (STD) testing. Diabetes screening. This is done by checking your blood sugar (glucose) after you have not eaten for a while (fasting). You may have this done every 1-3 years. Bone density scan. This is done to screen for osteoporosis. You may have this done starting at age 72. Mammogram. This may be done every 1-2 years. Talk to your health care provider about how often you should have regular mammograms. Talk with your health care provider about your test results, treatment options, and if necessary, the need for more tests. Vaccines  Your health care provider may recommend certain vaccines, such as: Influenza vaccine. This is recommended every year. Tetanus, diphtheria, and acellular pertussis (Tdap, Td) vaccine. You may need a Td booster every 10  years. Zoster vaccine. You may need this after age  79. Pneumococcal 13-valent conjugate (PCV13) vaccine. One dose is recommended after age 33. Pneumococcal polysaccharide (PPSV23) vaccine. One dose is recommended after age 63. Talk to your health care provider about which screenings and vaccines you need and how often you need them. This information is not intended to replace advice given to you by your health care provider. Make sure you discuss any questions you have with your health care provider. Document Released: 07/18/2015 Document Revised: 03/10/2016 Document Reviewed: 04/22/2015 Elsevier Interactive Patient Education  2017 [place] Prevention in the Home Falls can cause injuries. They can happen to people of all ages. There are many things you can do to make your home safe and to help prevent falls. What can I do on the outside of my home? Regularly fix the edges of walkways and driveways and fix any cracks. Remove anything that might make you trip as you walk through a door, such as a raised step or threshold. Trim any bushes or trees on the path to your home. Use bright outdoor lighting. Clear any walking paths of anything that might make someone trip, such as rocks or tools. Regularly check to see if handrails are loose or broken. Make sure that both sides of any steps have handrails. Any raised decks and porches should have guardrails on the edges. Have any leaves, snow, or ice cleared regularly. Use sand or salt on walking paths during winter. Clean up any spills in your garage right away. This includes oil or grease spills. What can I do in the bathroom? Use night lights. Install grab bars by the toilet and in the tub and shower. Do not use towel bars as grab bars. Use non-skid mats or decals in the tub or shower. If you need to sit down in the shower, use a plastic, non-slip stool. Keep the floor dry. Clean up any water that spills on the floor as soon as it happens. Remove soap buildup in the tub or shower  regularly. Attach bath mats securely with double-sided non-slip rug tape. Do not have throw rugs and other things on the floor that can make you trip. What can I do in the bedroom? Use night lights. Make sure that you have a light by your bed that is easy to reach. Do not use any sheets or blankets that are too big for your bed. They should not hang down onto the floor. Have a firm chair that has side arms. You can use this for support while you get dressed. Do not have throw rugs and other things on the floor that can make you trip. What can I do in the kitchen? Clean up any spills right away. Avoid walking on wet floors. Keep items that you use a lot in easy-to-reach places. If you need to reach something above you, use a strong step stool that has a grab bar. Keep electrical cords out of the way. Do not use floor polish or wax that makes floors slippery. If you must use wax, use non-skid floor wax. Do not have throw rugs and other things on the floor that can make you trip. What can I do with my stairs? Do not leave any items on the stairs. Make sure that there are handrails on both sides of the stairs and use them. Fix handrails that are broken or loose. Make sure that handrails are as long as the stairways. Check any carpeting to make sure  that it is firmly attached to the stairs. Fix any carpet that is loose or worn. Avoid having throw rugs at the top or bottom of the stairs. If you do have throw rugs, attach them to the floor with carpet tape. Make sure that you have a light switch at the top of the stairs and the bottom of the stairs. If you do not have them, ask someone to add them for you. What else can I do to help prevent falls? Wear shoes that: Do not have high heels. Have rubber bottoms. Are comfortable and fit you well. Are closed at the toe. Do not wear sandals. If you use a stepladder: Make sure that it is fully opened. Do not climb a closed stepladder. Make sure that  both sides of the stepladder are locked into place. Ask someone to hold it for you, if possible. Clearly mark and make sure that you can see: Any grab bars or handrails. First and last steps. Where the edge of each step is. Use tools that help you move around (mobility aids) if they are needed. These include: Canes. Walkers. Scooters. Crutches. Turn on the lights when you go into a dark area. Replace any light bulbs as soon as they burn out. Set up your furniture so you have a clear path. Avoid moving your furniture around. If any of your floors are uneven, fix them. If there are any pets around you, be aware of where they are. Review your medicines with your doctor. Some medicines can make you feel dizzy. This can increase your chance of falling. Ask your doctor what other things that you can do to help prevent falls. This information is not intended to replace advice given to you by your health care provider. Make sure you discuss any questions you have with your health care provider. Document Released: 04/17/2009 Document Revised: 11/27/2015 Document Reviewed: 07/26/2014 Elsevier Interactive Patient Education  2017 [employer].

## 2021-09-02 ENCOUNTER — Telehealth: Payer: Self-pay

## 2021-09-02 NOTE — Telephone Encounter (Signed)
March 15  @ 10:30 in [facility]./ Wednesday ?

## 2021-09-16 ENCOUNTER — Ambulatory Visit
Admission: RE | Admit: 2021-09-16 | Discharge: 2021-09-16 | Disposition: A | Discharge status: Home / Self Care | Payer: Medicare Other | Source: Ambulatory Visit | Attending: Family Medicine | Admitting: Family Medicine

## 2021-09-16 ENCOUNTER — Other Ambulatory Visit: Payer: Self-pay

## 2021-09-16 DIAGNOSIS — Z1231 Encounter for screening mammogram for malignant neoplasm of breast: Secondary | ICD-10-CM | POA: Diagnosis not present

## 2021-09-16 DIAGNOSIS — M5136 Other intervertebral disc degeneration, lumbar region: Secondary | ICD-10-CM | POA: Diagnosis not present

## 2021-09-16 DIAGNOSIS — M9905 Segmental and somatic dysfunction of pelvic region: Secondary | ICD-10-CM | POA: Diagnosis not present

## 2021-09-16 DIAGNOSIS — M62830 Muscle spasm of back: Secondary | ICD-10-CM | POA: Diagnosis not present

## 2021-09-16 DIAGNOSIS — M9903 Segmental and somatic dysfunction of lumbar region: Secondary | ICD-10-CM | POA: Diagnosis not present

## 2021-09-24 DIAGNOSIS — Z961 Presence of intraocular lens: Secondary | ICD-10-CM | POA: Diagnosis not present

## 2021-10-14 ENCOUNTER — Ambulatory Visit (INDEPENDENT_AMBULATORY_CARE_PROVIDER_SITE_OTHER): Payer: Medicare Other | Admitting: Family Medicine

## 2021-10-14 ENCOUNTER — Encounter: Payer: Self-pay | Admitting: Family Medicine

## 2021-10-14 VITALS — BP 120/80 | HR 74 | Ht 66.0 in | Wt 221.0 lb

## 2021-10-14 DIAGNOSIS — I10 Essential (primary) hypertension: Secondary | ICD-10-CM | POA: Diagnosis not present

## 2021-10-14 DIAGNOSIS — Z90710 Acquired absence of both cervix and uterus: Secondary | ICD-10-CM | POA: Insufficient documentation

## 2021-10-14 DIAGNOSIS — R6881 Early satiety: Secondary | ICD-10-CM

## 2021-10-14 DIAGNOSIS — E7800 Pure hypercholesterolemia, unspecified: Secondary | ICD-10-CM

## 2021-10-14 MED ORDER — ROSUVASTATIN CALCIUM 20 MG PO TABS: 20.0000 mg | ORAL_TABLET | Freq: Every day | ORAL | 1 refills | Status: DC

## 2021-10-14 MED ORDER — LISINOPRIL-HYDROCHLOROTHIAZIDE 10-12.5 MG PO TABS: 1.0000 | ORAL_TABLET | Freq: Every day | ORAL | 1 refills | Status: DC

## 2021-10-14 NOTE — Progress Notes (Signed)
? ? ?Date:  10/14/2021  ? ?Name:  Theresa Gross   DOB:  26-Oct-1955   MRN:  [id number] ? ? ?Chief Complaint: Hypertension and Hyperlipidemia ? ?Hypertension ?This is a chronic problem. The current episode started more than 1 year ago. The problem has been gradually improving since onset. The problem is controlled. Pertinent negatives include no anxiety, blurred vision, chest pain, headaches, malaise/fatigue, neck pain, orthopnea, palpitations, peripheral edema, PND, shortness of breath or sweats. There are no associated agents to hypertension. Risk factors for coronary artery disease include dyslipidemia. Past treatments include ACE inhibitors and diuretics. The current treatment provides moderate improvement. There are no compliance problems.  There is no history of angina, kidney disease, CAD/MI, CVA, heart failure, left ventricular hypertrophy, PVD or retinopathy. There is no history of chronic renal disease, a hypertension causing med or renovascular disease.  ?Hyperlipidemia ?This is a chronic problem. The current episode started more than 1 year ago. The problem is controlled. Recent lipid tests were reviewed and are normal. She has no history of chronic renal disease, diabetes, hypothyroidism, liver disease, obesity or nephrotic syndrome. Pertinent negatives include no chest pain, myalgias or shortness of breath.  ? ?Lab Results  ?Component Value Date  ? NA 140 06/13/2019  ? K 4.4 06/13/2019  ? CO2 23 06/13/2019  ? GLUCOSE 90 06/13/2019  ? BUN 10 06/13/2019  ? CREATININE 0.71 06/13/2019  ? CALCIUM 9.5 06/13/2019  ? GFRNONAA 90 06/13/2019  ? ?Lab Results  ?Component Value Date  ? CHOL 167 04/15/2021  ? HDL 61 04/15/2021  ? [place] 82 04/15/2021  ? TRIG 140 04/15/2021  ? CHOLHDL 4.6 (H) 08/23/2017  ? ?No results found for: TSH ?No results found for: HGBA1C ?Lab Results  ?Component Value Date  ? WBC 6.9 05/17/2013  ? HGB 13.7 05/17/2013  ? HCT 39.4 05/17/2013  ? MCV 94 05/17/2013  ? PLT 241 05/17/2013  ? ?Lab  Results  ?Component Value Date  ? ALT 28 06/13/2019  ? AST 25 06/13/2019  ? ALKPHOS 77 06/13/2019  ? BILITOT 0.5 06/13/2019  ? ?No results found for: 25OHVITD2, [place], VD25OH  ? ?Review of Systems  ?Constitutional: Negative.  Negative for chills, fatigue, fever, malaise/fatigue and unexpected weight change.  ?HENT:  Negative for congestion, ear discharge, ear pain, rhinorrhea, sinus pressure, sneezing and sore throat.   ?Eyes:  Negative for blurred vision.  ?Respiratory:  Negative for cough, shortness of breath, wheezing and stridor.   ?Cardiovascular:  Negative for chest pain, palpitations, orthopnea and PND.  ?Gastrointestinal:  Negative for abdominal pain, blood in stool, constipation, diarrhea and nausea.  ?Genitourinary:  Negative for dysuria, flank pain, frequency, hematuria, urgency and vaginal discharge.  ?Musculoskeletal:  Negative for arthralgias, back pain, myalgias and neck pain.  ?Skin:  Negative for rash.  ?Neurological:  Negative for dizziness, weakness and headaches.  ?Hematological:  Negative for adenopathy. Does not bruise/bleed easily.  ?Psychiatric/Behavioral:  Negative for dysphoric mood. The patient is not nervous/anxious.   ? ?Patient Active Problem List  ? Diagnosis Date Noted  ? History of total abdominal hysterectomy 10/14/2021  ? Varicose veins of leg with pain, left 11/04/2020  ? Varicose veins of leg with pain, right 10/07/2020  ? Osteopenia 08/07/2020  ? Class 2 obesity due to excess calories without serious comorbidity with body mass index (BMI) of 37.0 to 37.9 in adult 02/16/2017  ? Pure hypercholesterolemia 02/16/2017  ? Aortic atherosclerosis ([place]) 05/25/2016  ? Steatosis of liver 05/25/2016  ? Mixed hyperlipidemia 05/25/2016  ?  Essential (primary) hypertension 10/28/2014  ? Gastro-esophageal reflux disease without esophagitis 10/28/2014  ? H/O psoriasis 10/28/2014  ? Decreased potassium in the blood 10/28/2014  ? Familial multiple lipoprotein-type hyperlipidemia 10/28/2014   ? ? ?Allergies  ?Allergen Reactions  ? Ciprofloxacin Nausea And Vomiting  ? Lipitor [Atorvastatin] Other (See Comments)  ?  Body Aches  ? Metronidazole Nausea And Vomiting  ? Nsaids Other (See Comments)  ?  Have to be careful due to liver  ? ? ?Past Surgical History:  ?Procedure Laterality Date  ? ABDOMINAL HYSTERECTOMY    ? BREAST BIOPSY Right 10 plus yrs ago  ? stereo bx./clip. Benign  ? CHOLECYSTECTOMY    ? COLONOSCOPY WITH PROPOFOL N/A 06/16/2016  ? Procedure: COLONOSCOPY WITH PROPOFOL;  Surgeon: Manya Silvas, MD;  Location: [facility] ENDOSCOPY;  Service: Endoscopy;  Laterality: N/A;  ? COLONOSCOPY WITH PROPOFOL N/A 12/17/2019  ? Procedure: COLONOSCOPY WITH PROPOFOL;  Surgeon: Toledo, Benay Pike, MD;  Location: [facility] ENDOSCOPY;  Service: Gastroenterology;  Laterality: N/A;  ? DIAGNOSTIC LAPAROSCOPY    ? ESOPHAGOGASTRODUODENOSCOPY (EGD) WITH PROPOFOL N/A 12/17/2019  ? Procedure: ESOPHAGOGASTRODUODENOSCOPY (EGD) WITH PROPOFOL;  Surgeon: Toledo, Benay Pike, MD;  Location: [facility] ENDOSCOPY;  Service: Gastroenterology;  Laterality: N/A;  ? EYE SURGERY    ? GALLBLADDER SURGERY  06/04/2014  ? TOTAL ABDOMINAL HYSTERECTOMY    ? UPPER GASTROINTESTINAL ENDOSCOPY    ? ? ?Social History  ? ?Tobacco Use  ? Smoking status: Former  ?  Types: Cigarettes  ? Smokeless tobacco: Never  ?Vaping Use  ? Vaping Use: Never used  ?Substance Use Topics  ? Alcohol use: Yes  ?  Alcohol/week: 1.0 standard drink  ?  Types: 1 Cans of beer per week  ? Drug use: No  ? ? ? ?Medication list has been reviewed and updated. ? ?Current Meds  ?Medication Sig  ? Apremilast (OTEZLA) 30 MG TABS Take 1 tablet (30 mg total) by mouth 2 (two) times daily.  ? calcipotriene-betamethasone (TACLONEX) external suspension Apply topically daily.  ? Calcium-Phosphorus-Vitamin D (CITRACAL +D3 PO) Take 2 tablets by mouth daily.  ? Cholecalciferol (VITAMIN D) 2000 units tablet Take 2,000 Units by mouth daily.  ? co-enzyme Q-10 30 MG capsule Take 30 mg by mouth daily.  ?  DELZICOL 400 MG CPDR DR capsule Take 2 tablets by mouth 2 (two) times daily. GI Dr  ? ketoconazole (NIZORAL) 2 % cream Apply to affected areas psoriasis on face once to twice daily.  ? lansoprazole (PREVACID) 30 MG capsule Take 1 capsule by mouth daily. GI Doc  ? lisinopril-hydrochlorothiazide (ZESTORETIC) 10-12.5 MG tablet Take 1 tablet by mouth daily.  ? meclizine (ANTIVERT) 25 MG tablet Take 1 tablet (25 mg total) by mouth 3 (three) times daily as needed for dizziness.  ? Multiple Vitamins-Minerals (MULTIVITAMIN WOMEN) TABS Take by mouth.  ? nystatin-triamcinolone (MYCOLOG II) cream Apply 1 application topically 2 (two) times daily. PRN/ Derm  ? rosuvastatin (CRESTOR) 20 MG tablet Take 1 tablet (20 mg total) by mouth daily.  ? triamcinolone cream (KENALOG) 0.1 % Apply 1 application topically as directed. Qd to bid aa itching on back until clear, avoid face, groin, axilla  ? ? ? ?  10/14/2021  ? 10:15 AM 04/15/2021  ?  9:20 AM 10/22/2020  ?  9:25 AM 07/29/2020  ?  9:46 AM  ?GAD 7 : Generalized Anxiety Score  ?Nervous, Anxious, on Edge 0 0 0 0  ?Control/stop worrying 0 0 0 0  ?Worry too much - different  things 0 0 0 0  ?Trouble relaxing 0 0 0 0  ?Restless 0 0 0 0  ?Easily annoyed or irritable 0 0 0 0  ?Afraid - awful might happen 0 0 0 0  ?Total GAD 7 Score 0 0 0 0  ?Anxiety Difficulty Not difficult at all     ? ? ? ?  10/14/2021  ? 10:15 AM  ?Depression screen PHQ 2/9  ?Decreased Interest 0  ?Down, Depressed, Hopeless 0  ?PHQ - 2 Score 0  ?Altered sleeping 0  ?Tired, decreased energy 0  ?Change in appetite 0  ?Feeling bad or failure about yourself  0  ?Trouble concentrating 0  ?Moving slowly or fidgety/restless 0  ?Suicidal thoughts 0  ?PHQ-9 Score 0  ?Difficult doing work/chores Not difficult at all  ? ? ?BP Readings from Last 3 Encounters:  ?10/14/21 120/80  ?06/17/21 138/84  ?04/15/21 124/82  ? ? ?Physical Exam ?Vitals and nursing note reviewed. Exam conducted with a chaperone present.  ?Constitutional:   ?    General: She is not in acute distress. ?   Appearance: She is not diaphoretic.  ?HENT:  ?   Head: Normocephalic and atraumatic.  ?   Right Ear: Tympanic membrane and external ear normal.  ?   Left Ear: Tympanic Gardiner Fanti

## 2021-10-15 DIAGNOSIS — M9905 Segmental and somatic dysfunction of pelvic region: Secondary | ICD-10-CM | POA: Diagnosis not present

## 2021-10-15 DIAGNOSIS — M5136 Other intervertebral disc degeneration, lumbar region: Secondary | ICD-10-CM | POA: Diagnosis not present

## 2021-10-15 DIAGNOSIS — M9903 Segmental and somatic dysfunction of lumbar region: Secondary | ICD-10-CM | POA: Diagnosis not present

## 2021-10-15 DIAGNOSIS — M62830 Muscle spasm of back: Secondary | ICD-10-CM | POA: Diagnosis not present

## 2021-11-11 DIAGNOSIS — M62830 Muscle spasm of back: Secondary | ICD-10-CM | POA: Diagnosis not present

## 2021-11-11 DIAGNOSIS — M5136 Other intervertebral disc degeneration, lumbar region: Secondary | ICD-10-CM | POA: Diagnosis not present

## 2021-11-11 DIAGNOSIS — M9903 Segmental and somatic dysfunction of lumbar region: Secondary | ICD-10-CM | POA: Diagnosis not present

## 2021-11-11 DIAGNOSIS — M9905 Segmental and somatic dysfunction of pelvic region: Secondary | ICD-10-CM | POA: Diagnosis not present

## 2021-12-09 DIAGNOSIS — M5136 Other intervertebral disc degeneration, lumbar region: Secondary | ICD-10-CM | POA: Diagnosis not present

## 2021-12-09 DIAGNOSIS — M62830 Muscle spasm of back: Secondary | ICD-10-CM | POA: Diagnosis not present

## 2021-12-09 DIAGNOSIS — M9905 Segmental and somatic dysfunction of pelvic region: Secondary | ICD-10-CM | POA: Diagnosis not present

## 2021-12-09 DIAGNOSIS — M9903 Segmental and somatic dysfunction of lumbar region: Secondary | ICD-10-CM | POA: Diagnosis not present

## 2021-12-14 DIAGNOSIS — K5150 Left sided colitis without complications: Secondary | ICD-10-CM | POA: Diagnosis not present

## 2021-12-14 DIAGNOSIS — K449 Diaphragmatic hernia without obstruction or gangrene: Secondary | ICD-10-CM | POA: Diagnosis not present

## 2021-12-14 DIAGNOSIS — R1013 Epigastric pain: Secondary | ICD-10-CM | POA: Diagnosis not present

## 2021-12-14 DIAGNOSIS — K219 Gastro-esophageal reflux disease without esophagitis: Secondary | ICD-10-CM | POA: Diagnosis not present

## 2021-12-14 DIAGNOSIS — R6881 Early satiety: Secondary | ICD-10-CM | POA: Diagnosis not present

## 2021-12-16 DIAGNOSIS — M9903 Segmental and somatic dysfunction of lumbar region: Secondary | ICD-10-CM | POA: Diagnosis not present

## 2021-12-16 DIAGNOSIS — M9905 Segmental and somatic dysfunction of pelvic region: Secondary | ICD-10-CM | POA: Diagnosis not present

## 2021-12-16 DIAGNOSIS — M5136 Other intervertebral disc degeneration, lumbar region: Secondary | ICD-10-CM | POA: Diagnosis not present

## 2021-12-16 DIAGNOSIS — M62830 Muscle spasm of back: Secondary | ICD-10-CM | POA: Diagnosis not present

## 2021-12-22 DIAGNOSIS — M9905 Segmental and somatic dysfunction of pelvic region: Secondary | ICD-10-CM | POA: Diagnosis not present

## 2021-12-22 DIAGNOSIS — M9903 Segmental and somatic dysfunction of lumbar region: Secondary | ICD-10-CM | POA: Diagnosis not present

## 2021-12-22 DIAGNOSIS — M62830 Muscle spasm of back: Secondary | ICD-10-CM | POA: Diagnosis not present

## 2021-12-22 DIAGNOSIS — M5136 Other intervertebral disc degeneration, lumbar region: Secondary | ICD-10-CM | POA: Diagnosis not present

## 2022-01-06 DIAGNOSIS — M9903 Segmental and somatic dysfunction of lumbar region: Secondary | ICD-10-CM | POA: Diagnosis not present

## 2022-01-06 DIAGNOSIS — M5136 Other intervertebral disc degeneration, lumbar region: Secondary | ICD-10-CM | POA: Diagnosis not present

## 2022-01-06 DIAGNOSIS — M62830 Muscle spasm of back: Secondary | ICD-10-CM | POA: Diagnosis not present

## 2022-01-06 DIAGNOSIS — M9905 Segmental and somatic dysfunction of pelvic region: Secondary | ICD-10-CM | POA: Diagnosis not present

## 2022-01-26 DIAGNOSIS — M9903 Segmental and somatic dysfunction of lumbar region: Secondary | ICD-10-CM | POA: Diagnosis not present

## 2022-01-26 DIAGNOSIS — M62830 Muscle spasm of back: Secondary | ICD-10-CM | POA: Diagnosis not present

## 2022-01-26 DIAGNOSIS — M5136 Other intervertebral disc degeneration, lumbar region: Secondary | ICD-10-CM | POA: Diagnosis not present

## 2022-01-26 DIAGNOSIS — M9905 Segmental and somatic dysfunction of pelvic region: Secondary | ICD-10-CM | POA: Diagnosis not present

## 2022-01-28 ENCOUNTER — Other Ambulatory Visit: Payer: Self-pay | Admitting: Dermatology

## 2022-01-28 DIAGNOSIS — L409 Psoriasis, unspecified: Secondary | ICD-10-CM

## 2022-02-22 ENCOUNTER — Ambulatory Visit (INDEPENDENT_AMBULATORY_CARE_PROVIDER_SITE_OTHER): Payer: Medicare Other | Admitting: Dermatology

## 2022-02-22 DIAGNOSIS — L304 Erythema intertrigo: Secondary | ICD-10-CM

## 2022-02-22 DIAGNOSIS — Z86018 Personal history of other benign neoplasm: Secondary | ICD-10-CM | POA: Diagnosis not present

## 2022-02-22 DIAGNOSIS — D229 Melanocytic nevi, unspecified: Secondary | ICD-10-CM

## 2022-02-22 DIAGNOSIS — D225 Melanocytic nevi of trunk: Secondary | ICD-10-CM | POA: Diagnosis not present

## 2022-02-22 DIAGNOSIS — Z79899 Other long term (current) drug therapy: Secondary | ICD-10-CM

## 2022-02-22 DIAGNOSIS — D1800 Hemangioma unspecified site: Secondary | ICD-10-CM | POA: Diagnosis not present

## 2022-02-22 DIAGNOSIS — L578 Other skin changes due to chronic exposure to nonionizing radiation: Secondary | ICD-10-CM

## 2022-02-22 DIAGNOSIS — L821 Other seborrheic keratosis: Secondary | ICD-10-CM

## 2022-02-22 DIAGNOSIS — D224 Melanocytic nevi of scalp and neck: Secondary | ICD-10-CM | POA: Diagnosis not present

## 2022-02-22 DIAGNOSIS — L814 Other melanin hyperpigmentation: Secondary | ICD-10-CM | POA: Diagnosis not present

## 2022-02-22 DIAGNOSIS — L409 Psoriasis, unspecified: Secondary | ICD-10-CM

## 2022-02-22 MED ORDER — CALCIPOTRIENE-BETAMETH DIPROP 0.005-0.064 % EX SUSP: Freq: Every day | CUTANEOUS | 1 refills | Status: DC

## 2022-02-22 MED ORDER — OTEZLA 30 MG PO TABS: ORAL_TABLET | ORAL | 0 refills | Status: DC

## 2022-02-22 MED ORDER — NYSTATIN 100000 UNIT/GM EX POWD: CUTANEOUS | 3 refills | Status: DC

## 2022-02-22 MED ORDER — ZORYVE 0.3 % EX CREA: 1.0000 | TOPICAL_CREAM | Freq: Every day | CUTANEOUS | 3 refills | Status: DC

## 2022-02-22 NOTE — Patient Instructions (Addendum)
Due to recent changes in healthcare laws, you may see results of your pathology and/or laboratory studies on MyChart before the doctors have had a chance to review them. We understand that in some cases there may be results that are confusing or concerning to you. Please understand that not all results are received at the same time and often the doctors may need to interpret multiple results in order to provide you with the best plan of care or course of treatment. Therefore, we ask that you please give us 2 business days to thoroughly review all your results before contacting the office for clarification. Should we see a critical lab result, you will be contacted sooner.   If You Need Anything After Your Visit  If you have any questions or concerns for your doctor, please call our main line at [phone] and press option 4 to reach your doctor's medical assistant. If no one answers, please leave a voicemail as directed and we will return your call as soon as possible. Messages left after 4 pm will be answered the following business day.   You may also send us a message via MyChart. We typically respond to MyChart messages within 1-2 business days.  For prescription refills, please ask your pharmacy to contact our office. Our fax number is [phone].  If you have an urgent issue when the clinic is closed that cannot wait until the next business day, you can page your doctor at the number below.    Please note that while we do our best to be available for urgent issues outside of office hours, we are not available 24/7.   If you have an urgent issue and are unable to reach us, you may choose to seek medical care at your doctor's office, retail clinic, urgent care center, or emergency room.  If you have a medical emergency, please immediately call 911 or go to the emergency department.  Pager Numbers  - Dr. Kowalski: [phone]  - Dr. Moye: [phone]  - Dr. Stewart:  [phone]  In the event of inclement weather, please call our main line at [phone] for an update on the status of any delays or closures.  Dermatology Medication Tips: Please keep the boxes that topical medications come in in order to help keep track of the instructions about where and how to use these. Pharmacies typically print the medication instructions only on the boxes and not directly on the medication tubes.   If your medication is too expensive, please contact our office at [phone] option 4 or send us a message through MyChart.   We are unable to tell what your co-pay for medications will be in advance as this is different depending on your insurance coverage. However, we may be able to find a substitute medication at lower cost or fill out paperwork to get insurance to cover a needed medication.   If a prior authorization is required to get your medication covered by your insurance company, please allow us 1-2 business days to complete this process.  Drug prices often vary depending on where the prescription is filled and some pharmacies may offer cheaper prices.  The website www.goodrx.com contains coupons for medications through different pharmacies. The prices here do not account for what the cost may be with help from insurance (it may be cheaper with your insurance), but the website can give you the price if you did not use any insurance.  - You can print the associated coupon and take it with   your prescription to the pharmacy.  - You may also stop by our office during regular business hours and pick up a GoodRx coupon card.  - If you need your prescription sent electronically to a different pharmacy, notify our office through [facility] MyChart or by phone at [phone] option 4.     Si Usted Necesita Algo Despus de Su Visita  Tambin puede enviarnos un mensaje a travs de MyChart. Por lo general respondemos a los mensajes de MyChart en el transcurso de 1 a 2  das hbiles.  Para renovar recetas, por favor pida a su farmacia que se ponga en contacto con nuestra oficina. Nuestro nmero de fax es el [phone].  Si tiene un asunto urgente cuando la clnica est cerrada y que no puede esperar hasta el siguiente da hbil, puede llamar/localizar a su doctor(a) al nmero que aparece a continuacin.   Por favor, tenga en cuenta que aunque hacemos todo lo posible para estar disponibles para asuntos urgentes fuera del horario de oficina, no estamos disponibles las 24 horas del da, los 7 das de la semana.   Si tiene un problema urgente y no puede comunicarse con nosotros, puede optar por buscar atencin mdica  en el consultorio de su doctor(a), en una clnica privada, en un centro de atencin urgente o en una sala de emergencias.  Si tiene una emergencia mdica, por favor llame inmediatamente al 911 o vaya a la sala de emergencias.  Nmeros de bper  - Dr. Kowalski: [phone]  - Dra. Moye: [phone]  - Dra. Stewart: [phone]  En caso de inclemencias del tiempo, por favor llame a nuestra lnea principal al [phone] para una actualizacin sobre el estado de cualquier retraso o cierre.  Consejos para la medicacin en dermatologa: Por favor, guarde las cajas en las que vienen los medicamentos de uso tpico para ayudarle a seguir las instrucciones sobre dnde y cmo usarlos. Las farmacias generalmente imprimen las instrucciones del medicamento slo en las cajas y no directamente en los tubos del medicamento.   Si su medicamento es muy caro, por favor, pngase en contacto con nuestra oficina llamando al [phone] y presione la opcin 4 o envenos un mensaje a travs de MyChart.   No podemos decirle cul ser su copago por los medicamentos por adelantado ya que esto es diferente dependiendo de la cobertura de su seguro. Sin embargo, es posible que podamos encontrar un medicamento sustituto a menor costo o llenar un formulario para que el  seguro cubra el medicamento que se considera necesario.   Si se requiere una autorizacin previa para que su compaa de seguros cubra su medicamento, por favor permtanos de 1 a 2 das hbiles para completar este proceso.  Los precios de los medicamentos varan con frecuencia dependiendo del lugar de dnde se surte la receta y alguna farmacias pueden ofrecer precios ms baratos.  El sitio web www.goodrx.com tiene cupones para medicamentos de diferentes farmacias. Los precios aqu no tienen en cuenta lo que podra costar con la ayuda del seguro (puede ser ms barato con su seguro), pero el sitio web puede darle el precio si no utiliz ningn seguro.  - Puede imprimir el cupn correspondiente y llevarlo con su receta a la farmacia.  - Tambin puede pasar por nuestra oficina durante el horario de atencin regular y recoger una tarjeta de cupones de GoodRx.  - Si necesita que su receta se enve electrnicamente a una farmacia diferente, informe a nuestra oficina a travs de MyChart de    o por telfono llamando al [phone] y presione la opcin 4.  

## 2022-02-22 NOTE — Progress Notes (Signed)
Follow-Up Visit   Subjective  Theresa Gross is a 66 y.o. female who presents for the following: Psoriasis (6 month psoriasis. Still some active areas at back of scalp. Patient currently using otezla, taclonex solution , ketoconazole 2 % cream , and hydrocortisone cream.  ) and Rash (Patient reports rash at lower abdominal folds and would like to discuss treatment. ).  No side effects from [place].  The patient has spots, moles and lesions to be evaluated, some may be new or changing and the patient has concerns that these could be cancer.   The following portions of the chart were reviewed this encounter and updated as appropriate:      Review of Systems: No other skin or systemic complaints except as noted in HPI or Assessment and Plan.   Objective  Well appearing patient in no apparent distress; mood and affect are within normal limits.  A focused examination was performed including face, scalp, arms, back, chest, abdominal folds, b/l inguinal folds. Relevant physical exam findings are noted in the Assessment and Plan.  Scalp, face, elbows Pink scaly patch of the right occipital scalp; pink scaliness of the right posterior ear; elbows clear, Pink scaly patch glabella/medial eyebrows  b/l inguinal area Mild erythema at inguinal crease b/l   right posterior axilla 5 x 3 mm medium dark brown macule    Assessment & Plan  Psoriasis Scalp, face, elbows  Chronic and persistent condition with duration or expected duration over one year. Condition is symptomatic/ bothersome to patient.  Much improved on Otezla, but not clear   Psoriasis is a chronic non-curable, but treatable genetic/hereditary disease that may have other systemic features affecting other organ systems such as joints (Psoriatic Arthritis). It is associated with an increased risk of inflammatory bowel disease, heart disease, non-alcoholic fatty liver disease, and depression.    Tried and failed triamcinolone,  hydrocrotisone, otezla , and ketoconazole cream.  Start Zoryve 0.3 % cream to use at aa of face and other areas of body.  If not covered will try Vtama vs Elidil cream Continue Otezla '30mg'$  take 1 po BID dsp #180 1Rf Continue Taclonex Solution Apply to AA scalp qhs prn dsp 3 mo supply 1Rf   Side effects of Otezla (apremilast) include diarrhea, nausea, headache, upper respiratory infection, depression, and weight decrease (5-10%). It should only be taken by pregnant women after a discussion regarding risks and benefits with their doctor. Goal is control of skin condition, not cure.  The use of [place] requires long term medication management, including periodic office visits.   Topical steroids (such as triamcinolone, fluocinolone, fluocinonide, mometasone, clobetasol, halobetasol, betamethasone, hydrocortisone) can cause thinning and lightening of the skin if they are used for too long in the same area. Your physician has selected the right strength medicine for your problem and area affected on the body. Please use your medication only as directed by your physician to prevent side effects.   Roflumilast (ZORYVE) 0.3 % CREA - Scalp, face, elbows Apply 1 Application topically daily. Apply to aa of face and any other areas of body for rash  Apremilast (OTEZLA) 30 MG TABS - Scalp, face, elbows TAKE ONE TABLET BY MOUTH TWICE A DAY - ( DO NOT CRUSH, CHEW, OR SPLIT TABLETS)  calcipotriene-betamethasone (TACLONEX) external suspension - Scalp, face, elbows Apply topically daily.  Related Medications ketoconazole (NIZORAL) 2 % cream Apply to affected areas psoriasis on face once to twice daily.  Erythema intertrigo b/l inguinal area  Intertrigo is a  chronic recurrent rash that occurs in skin fold areas that may be associated with friction; heat; moisture; yeast; fungus; and bacteria.  It is exacerbated by increased movement / activity; sweating; and higher atmospheric temperature.  Start Nystatin  powder - apply topically bid/tid qd prn for rash at inguinal folds.  Continue using 2% ketoconazole cream twice a day as needed for flares.   nystatin (MYCOSTATIN/NYSTOP) powder - b/l inguinal area Apply 2 to 3 times daily as needed for rash at lower abdominal folds  Nevus right posterior axilla  Benign-appearing.  Observation.  Call clinic for new or changing lesions.  Recommend daily use of broad spectrum spf 30+ sunscreen to sun-exposed areas.     Seborrheic Keratoses - Stuck-on, waxy, tan-brown papules and/or plaques  - Benign-appearing - Discussed benign etiology and prognosis. - Observe - Call for any changes  Lentigines - Scattered tan macules - Due to sun exposure - Benign-appering, observe - Recommend daily broad spectrum sunscreen SPF 30+ to sun-exposed areas, reapply every 2 hours as needed. - Call for any changes  Hemangiomas - Red papules - Discussed benign nature - Observe - Call for any changes  Melanocytic Nevi Right occipital hairline  - Tan-brown and/or pink-flesh-colored symmetric macules and papules - Benign appearing on exam today - Observation - Call clinic for new or changing moles - Recommend daily use of broad spectrum spf 30+ sunscreen to sun-exposed areas. .  Actinic Damage - chronic, secondary to cumulative UV radiation exposure/sun exposure over time - diffuse scaly erythematous macules with underlying dyspigmentation - Recommend daily broad spectrum sunscreen SPF 30+ to sun-exposed areas, reapply every 2 hours as needed.  - Recommend staying in the shade or wearing long sleeves, sun glasses (UVA+UVB protection) and wide brim hats (4-inch brim around the entire circumference of the hat). - Call for new or changing lesions.  History of Dysplastic Nevi - No evidence of recurrence today - Recommend regular full body skin exams - Recommend daily broad spectrum sunscreen SPF 30+ to sun-exposed areas, reapply every 2 hours as needed.  - Call  if any new or changing lesions are noted between office visits  Return in about 6 months (around 08/25/2022) for psoriasis. I, Theresa Gross, CMA, am acting as scribe for Theresa Patty, MD.  Documentation: I have reviewed the above documentation for accuracy and completeness, and I agree with the above.  Theresa Patty MD

## 2022-02-25 DIAGNOSIS — M9905 Segmental and somatic dysfunction of pelvic region: Secondary | ICD-10-CM | POA: Diagnosis not present

## 2022-02-25 DIAGNOSIS — M9903 Segmental and somatic dysfunction of lumbar region: Secondary | ICD-10-CM | POA: Diagnosis not present

## 2022-02-25 DIAGNOSIS — M5136 Other intervertebral disc degeneration, lumbar region: Secondary | ICD-10-CM | POA: Diagnosis not present

## 2022-02-25 DIAGNOSIS — M62830 Muscle spasm of back: Secondary | ICD-10-CM | POA: Diagnosis not present

## 2022-03-25 DIAGNOSIS — M9905 Segmental and somatic dysfunction of pelvic region: Secondary | ICD-10-CM | POA: Diagnosis not present

## 2022-03-25 DIAGNOSIS — M5136 Other intervertebral disc degeneration, lumbar region: Secondary | ICD-10-CM | POA: Diagnosis not present

## 2022-03-25 DIAGNOSIS — M62830 Muscle spasm of back: Secondary | ICD-10-CM | POA: Diagnosis not present

## 2022-03-25 DIAGNOSIS — M9903 Segmental and somatic dysfunction of lumbar region: Secondary | ICD-10-CM | POA: Diagnosis not present

## 2022-04-19 ENCOUNTER — Encounter: Payer: Self-pay | Admitting: Family Medicine

## 2022-04-19 ENCOUNTER — Ambulatory Visit (INDEPENDENT_AMBULATORY_CARE_PROVIDER_SITE_OTHER): Payer: Medicare Other | Admitting: Family Medicine

## 2022-04-19 VITALS — BP 120/74 | HR 64 | Ht 66.0 in | Wt 222.0 lb

## 2022-04-19 DIAGNOSIS — Z23 Encounter for immunization: Secondary | ICD-10-CM

## 2022-04-19 DIAGNOSIS — R058 Other specified cough: Secondary | ICD-10-CM

## 2022-04-19 DIAGNOSIS — I10 Essential (primary) hypertension: Secondary | ICD-10-CM | POA: Diagnosis not present

## 2022-04-19 DIAGNOSIS — E7800 Pure hypercholesterolemia, unspecified: Secondary | ICD-10-CM | POA: Diagnosis not present

## 2022-04-19 MED ORDER — PROMETHAZINE-DM 6.25-15 MG/5ML PO SYRP: 5.0000 mL | ORAL_SOLUTION | Freq: Four times a day (QID) | ORAL | 0 refills | Status: DC | PRN

## 2022-04-19 MED ORDER — LISINOPRIL-HYDROCHLOROTHIAZIDE 10-12.5 MG PO TABS: 1.0000 | ORAL_TABLET | Freq: Every day | ORAL | 1 refills | Status: DC

## 2022-04-19 MED ORDER — ROSUVASTATIN CALCIUM 20 MG PO TABS: 20.0000 mg | ORAL_TABLET | Freq: Every day | ORAL | 1 refills | Status: DC

## 2022-04-19 NOTE — Progress Notes (Signed)
Date:  04/19/2022   Name:  Theresa Gross   DOB:  09-22-1955   MRN:  [id number]   Chief Complaint: Hypertension, Hyperlipidemia, and Flu Vaccine  Hypertension This is a chronic problem. The current episode started more than 1 year ago. The problem has been gradually improving since onset. The problem is controlled. Pertinent negatives include no blurred vision, chest pain, headaches, orthopnea, palpitations, PND or shortness of breath. Risk factors for coronary artery disease include dyslipidemia. Past treatments include ACE inhibitors and diuretics. The current treatment provides moderate improvement. There are no compliance problems.  There is no history of angina, kidney disease, CAD/MI, CVA, heart failure, left ventricular hypertrophy, PVD or retinopathy. There is no history of chronic renal disease, a hypertension causing med or renovascular disease.  Hyperlipidemia This is a chronic problem. The current episode started more than 1 year ago. The problem is controlled. Recent lipid tests were reviewed and are normal. She has no history of chronic renal disease, diabetes, hypothyroidism, liver disease, obesity or nephrotic syndrome. Pertinent negatives include no chest pain or shortness of breath. Current antihyperlipidemic treatment includes statins. The current treatment provides moderate improvement of lipids. There are no compliance problems.  Risk factors for coronary artery disease include dyslipidemia.    Lab Results  Component Value Date   NA 140 06/13/2019   K 4.4 06/13/2019   CO2 23 06/13/2019   GLUCOSE 90 06/13/2019   BUN 10 06/13/2019   CREATININE 0.71 06/13/2019   CALCIUM 9.5 06/13/2019   GFRNONAA 90 06/13/2019   Lab Results  Component Value Date   CHOL 167 04/15/2021   HDL 61 04/15/2021   LDLCALC 82 04/15/2021   TRIG 140 04/15/2021   CHOLHDL 4.6 (H) 08/23/2017   No results found for: "TSH" No results found for: "HGBA1C" Lab Results  Component Value Date   WBC  6.9 05/17/2013   HGB 13.7 05/17/2013   HCT 39.4 05/17/2013   MCV 94 05/17/2013   PLT 241 05/17/2013   Lab Results  Component Value Date   ALT 28 06/13/2019   AST 25 06/13/2019   ALKPHOS 77 06/13/2019   BILITOT 0.5 06/13/2019   No results found for: "25OHVITD2", "25OHVITD3", "VD25OH"   Review of Systems  Eyes:  Negative for blurred vision.  Respiratory:  Negative for shortness of breath.   Cardiovascular:  Negative for chest pain, palpitations, orthopnea and PND.  Neurological:  Negative for headaches.    Patient Active Problem List   Diagnosis Date Noted   History of total abdominal hysterectomy 10/14/2021   Varicose veins of leg with pain, left 11/04/2020   Varicose veins of leg with pain, right 10/07/2020   Osteopenia 08/07/2020   Class 2 obesity due to excess calories without serious comorbidity with body mass index (BMI) of 37.0 to 37.9 in adult 02/16/2017   Pure hypercholesterolemia 02/16/2017   Aortic atherosclerosis ([place]) 05/25/2016   Steatosis of liver 05/25/2016   Mixed hyperlipidemia 05/25/2016   Essential (primary) hypertension 10/28/2014   Gastro-esophageal reflux disease without esophagitis 10/28/2014   H/O psoriasis 10/28/2014   Decreased potassium in the blood 10/28/2014   Familial multiple lipoprotein-type hyperlipidemia 10/28/2014    Allergies  Allergen Reactions   Ciprofloxacin Nausea And Vomiting   Lipitor [Atorvastatin] Other (See Comments)    Body Aches   Metronidazole Nausea And Vomiting   Nsaids Other (See Comments)    Have to be careful due to liver    Past Surgical History:  Procedure Laterality Date  ABDOMINAL HYSTERECTOMY     BREAST BIOPSY Right 10 plus yrs ago   stereo bx./clip. Benign   CHOLECYSTECTOMY     COLONOSCOPY WITH PROPOFOL N/A 06/16/2016   Procedure: COLONOSCOPY WITH PROPOFOL;  Surgeon: Manya Silvas, MD;  Location: [facility];  Service: Endoscopy;  Laterality: N/A;   COLONOSCOPY WITH PROPOFOL N/A 12/17/2019    Procedure: COLONOSCOPY WITH PROPOFOL;  Surgeon: Toledo, Benay Pike, MD;  Location: [facility] ENDOSCOPY;  Service: Gastroenterology;  Laterality: N/A;   DIAGNOSTIC LAPAROSCOPY     ESOPHAGOGASTRODUODENOSCOPY (EGD) WITH PROPOFOL N/A 12/17/2019   Procedure: ESOPHAGOGASTRODUODENOSCOPY (EGD) WITH PROPOFOL;  Surgeon: Toledo, Benay Pike, MD;  Location: [facility] ENDOSCOPY;  Service: Gastroenterology;  Laterality: N/A;   EYE SURGERY     GALLBLADDER SURGERY  06/04/2014   TOTAL ABDOMINAL HYSTERECTOMY     UPPER GASTROINTESTINAL ENDOSCOPY      Social History   Tobacco Use   Smoking status: Former    Types: Cigarettes   Smokeless tobacco: Never  Vaping Use   Vaping Use: Never used  Substance Use Topics   Alcohol use: Yes    Alcohol/week: 1.0 standard drink of alcohol    Types: 1 Cans of beer per week   Drug use: No     Medication list has been reviewed and updated.  Current Meds  Medication Sig   Apremilast (OTEZLA) 30 MG TABS TAKE ONE TABLET BY MOUTH TWICE A DAY - ( DO NOT CRUSH, CHEW, OR SPLIT TABLETS)   calcipotriene-betamethasone (TACLONEX) external suspension Apply topically daily.   Calcium-Phosphorus-Vitamin D (CITRACAL +D3 PO) Take 2 tablets by mouth daily.   Cholecalciferol (VITAMIN D) 2000 units tablet Take 2,000 Units by mouth daily.   co-enzyme Q-10 30 MG capsule Take 30 mg by mouth daily.   DELZICOL 400 MG CPDR DR capsule Take 2 tablets by mouth 2 (two) times daily. GI Dr   ketoconazole (NIZORAL) 2 % cream Apply to affected areas psoriasis on face once to twice daily.   lisinopril-hydrochlorothiazide (ZESTORETIC) 10-12.5 MG tablet Take 1 tablet by mouth daily.   meclizine (ANTIVERT) 25 MG tablet Take 1 tablet (25 mg total) by mouth 3 (three) times daily as needed for dizziness.   Multiple Vitamins-Minerals (MULTIVITAMIN WOMEN) TABS Take by mouth.   nystatin (MYCOSTATIN/NYSTOP) powder Apply 2 to 3 times daily as needed for rash at lower abdominal folds   nystatin-triamcinolone (MYCOLOG II)  cream Apply 1 application topically 2 (two) times daily. PRN/ Derm   pantoprazole (PROTONIX) 40 MG tablet Take by mouth.   rosuvastatin (CRESTOR) 20 MG tablet Take 1 tablet (20 mg total) by mouth daily.   triamcinolone cream (KENALOG) 0.1 % Apply 1 application topically as directed. Qd to bid aa itching on back until clear, avoid face, groin, axilla       04/19/2022    9:38 AM 10/14/2021   10:15 AM 04/15/2021    9:20 AM 10/22/2020    9:25 AM  GAD 7 : Generalized Anxiety Score  Nervous, Anxious, on Edge 0 0 0 0  Control/stop worrying 0 0 0 0  Worry too much - different things 0 0 0 0  Trouble relaxing 0 0 0 0  Restless 0 0 0 0  Easily annoyed or irritable 0 0 0 0  Afraid - awful might happen 0 0 0 0  Total GAD 7 Score 0 0 0 0  Anxiety Difficulty Not difficult at all Not difficult at all         04/19/2022    9:37  AM 10/14/2021   10:15 AM 08/24/2021    8:16 AM  Depression screen PHQ 2/9  Decreased Interest 0 0 0  Down, Depressed, Hopeless 0 0 0  PHQ - 2 Score 0 0 0  Altered sleeping 0 0   Tired, decreased energy 0 0   Change in appetite 0 0   Feeling bad or failure about yourself  0 0   Trouble concentrating 0 0   Moving slowly or fidgety/restless 0 0   Suicidal thoughts 0 0   PHQ-9 Score 0 0   Difficult doing work/chores Not difficult at all Not difficult at all     BP Readings from Last 3 Encounters:  04/19/22 120/74  10/14/21 120/80  06/17/21 138/84    Physical Exam Vitals and nursing note reviewed. Exam conducted with a chaperone present.  Constitutional:      General: She is not in acute distress.    Appearance: She is not diaphoretic.  HENT:     Head: Normocephalic and atraumatic.     Right Ear: External ear normal.     Left Ear: External ear normal.     Nose: Nose normal.  Eyes:     General:        Right eye: No discharge.        Left eye: No discharge.     Conjunctiva/sclera: Conjunctivae normal.     Pupils: Pupils are equal, round, and reactive to  light.  Neck:     Thyroid: No thyromegaly.     Vascular: No JVD.  Cardiovascular:     Rate and Rhythm: Normal rate and regular rhythm.     Heart sounds: Normal heart sounds. No murmur heard.    No friction rub. No gallop.  Pulmonary:     Effort: Pulmonary effort is normal.     Breath sounds: Normal breath sounds. No decreased breath sounds, wheezing, rhonchi or rales.  Abdominal:     General: Bowel sounds are normal.     Palpations: Abdomen is soft. There is no hepatomegaly, splenomegaly or mass.     Tenderness: There is no abdominal tenderness. There is no guarding.  Musculoskeletal:        General: Normal range of motion.     Cervical back: Neck supple.  Lymphadenopathy:     Cervical: No cervical adenopathy.  Skin:    General: Skin is warm and dry.  Neurological:     Mental Status: She is alert.     Wt Readings from Last 3 Encounters:  04/19/22 222 lb (100.7 kg)  10/14/21 221 lb (100.2 kg)  04/15/21 219 lb (99.3 kg)    BP 120/74   Pulse 64   Ht '5\' 6"'$  (1.676 m)   Wt 222 lb (100.7 kg)   BMI 35.83 kg/m   Assessment and Plan:  1. Essential (primary) hypertension Chronic.  Controlled.  Stable.  Blood pressure 120/74.  Asymptomatic.  Tolerating medication well.  Continue lisinopril hydrochlorothiazide 10-12.5 mg grams once a day we will check CMP for electrolytes and GFR. - lisinopril-hydrochlorothiazide (ZESTORETIC) 10-12.5 MG tablet; Take 1 tablet by mouth daily.  Dispense: 90 tablet; Refill: 1 - Comprehensive Metabolic Panel (CMET)  2. Pure hypercholesterolemia Chronic.  Controlled.  Stable.  Continue rosuvastatin 20 mg once a day.  Will check lipid panel. - rosuvastatin (CRESTOR) 20 MG tablet; Take 1 tablet (20 mg total) by mouth daily.  Dispense: 90 tablet; Refill: 1 - Lipid Panel With LDL/HDL Ratio  3. Recurrent nonproductive cough Chronic.  Rate  current.  Stable.  Patient has a cough particularly at night which may be reflux oriented.  We will give some  promethazine with DM to be used at night.  4. Need for immunization against influenza Discussed and administered - Flu Vaccine QUAD High Dose(Fluad)    Otilio Miu, MD

## 2022-04-20 LAB — COMPREHENSIVE METABOLIC PANEL
ALT: 25 IU/L (ref 0–32)
AST: 28 IU/L (ref 0–40)
Albumin/Globulin Ratio: 1.7 (ref 1.2–2.2)
Albumin: 4.3 g/dL (ref 3.9–4.9)
Alkaline Phosphatase: 66 IU/L (ref 44–121)
BUN/Creatinine Ratio: 10 — ABNORMAL LOW (ref 12–28)
BUN: 8 mg/dL (ref 8–27)
Bilirubin Total: 0.7 mg/dL (ref 0.0–1.2)
CO2: 25 mmol/L (ref 20–29)
Calcium: 9.8 mg/dL (ref 8.7–10.3)
Chloride: 103 mmol/L (ref 96–106)
Creatinine, Ser: 0.84 mg/dL (ref 0.57–1.00)
Globulin, Total: 2.6 g/dL (ref 1.5–4.5)
Glucose: 97 mg/dL (ref 70–99)
Potassium: 4.6 mmol/L (ref 3.5–5.2)
Sodium: 142 mmol/L (ref 134–144)
Total Protein: 6.9 g/dL (ref 6.0–8.5)
eGFR: 76 mL/min/{1.73_m2} (ref >59)

## 2022-04-20 LAB — LIPID PANEL WITH LDL/HDL RATIO
Cholesterol, Total: 157 mg/dL (ref 100–199)
HDL: 58 mg/dL (ref >39)
LDL Chol Calc (NIH): 65 mg/dL (ref 0–99)
LDL/HDL Ratio: 1.1 ratio (ref 0.0–3.2)
Triglycerides: 206 mg/dL — ABNORMAL HIGH (ref 0–149)
VLDL Cholesterol Cal: 34 mg/dL (ref 5–40)

## 2022-04-22 DIAGNOSIS — M62830 Muscle spasm of back: Secondary | ICD-10-CM | POA: Diagnosis not present

## 2022-04-22 DIAGNOSIS — M9903 Segmental and somatic dysfunction of lumbar region: Secondary | ICD-10-CM | POA: Diagnosis not present

## 2022-04-22 DIAGNOSIS — M5136 Other intervertebral disc degeneration, lumbar region: Secondary | ICD-10-CM | POA: Diagnosis not present

## 2022-04-22 DIAGNOSIS — M9905 Segmental and somatic dysfunction of pelvic region: Secondary | ICD-10-CM | POA: Diagnosis not present

## 2022-05-03 ENCOUNTER — Encounter (INDEPENDENT_AMBULATORY_CARE_PROVIDER_SITE_OTHER): Payer: Self-pay

## 2022-05-19 DIAGNOSIS — M9905 Segmental and somatic dysfunction of pelvic region: Secondary | ICD-10-CM | POA: Diagnosis not present

## 2022-05-19 DIAGNOSIS — M62830 Muscle spasm of back: Secondary | ICD-10-CM | POA: Diagnosis not present

## 2022-05-19 DIAGNOSIS — M5136 Other intervertebral disc degeneration, lumbar region: Secondary | ICD-10-CM | POA: Diagnosis not present

## 2022-05-19 DIAGNOSIS — M9903 Segmental and somatic dysfunction of lumbar region: Secondary | ICD-10-CM | POA: Diagnosis not present

## 2022-06-17 DIAGNOSIS — M9905 Segmental and somatic dysfunction of pelvic region: Secondary | ICD-10-CM | POA: Diagnosis not present

## 2022-06-17 DIAGNOSIS — M62830 Muscle spasm of back: Secondary | ICD-10-CM | POA: Diagnosis not present

## 2022-06-17 DIAGNOSIS — M9903 Segmental and somatic dysfunction of lumbar region: Secondary | ICD-10-CM | POA: Diagnosis not present

## 2022-06-17 DIAGNOSIS — M5136 Other intervertebral disc degeneration, lumbar region: Secondary | ICD-10-CM | POA: Diagnosis not present

## 2022-07-14 ENCOUNTER — Ambulatory Visit (INDEPENDENT_AMBULATORY_CARE_PROVIDER_SITE_OTHER): Payer: Medicare Other | Admitting: Dermatology

## 2022-07-14 VITALS — BP 139/76

## 2022-07-14 DIAGNOSIS — M5136 Other intervertebral disc degeneration, lumbar region: Secondary | ICD-10-CM | POA: Diagnosis not present

## 2022-07-14 DIAGNOSIS — M62830 Muscle spasm of back: Secondary | ICD-10-CM | POA: Diagnosis not present

## 2022-07-14 DIAGNOSIS — L820 Inflamed seborrheic keratosis: Secondary | ICD-10-CM | POA: Diagnosis not present

## 2022-07-14 DIAGNOSIS — M9905 Segmental and somatic dysfunction of pelvic region: Secondary | ICD-10-CM | POA: Diagnosis not present

## 2022-07-14 DIAGNOSIS — L578 Other skin changes due to chronic exposure to nonionizing radiation: Secondary | ICD-10-CM

## 2022-07-14 DIAGNOSIS — M9903 Segmental and somatic dysfunction of lumbar region: Secondary | ICD-10-CM | POA: Diagnosis not present

## 2022-07-14 DIAGNOSIS — L409 Psoriasis, unspecified: Secondary | ICD-10-CM | POA: Diagnosis not present

## 2022-07-14 MED ORDER — VTAMA 1 % EX CREA: TOPICAL_CREAM | CUTANEOUS | 3 refills | Status: DC

## 2022-07-14 MED ORDER — OTEZLA 30 MG PO TABS: ORAL_TABLET | ORAL | 1 refills | Status: DC

## 2022-07-14 NOTE — Patient Instructions (Addendum)
Cryotherapy Aftercare  Wash gently with soap and water everyday.   Apply Vaseline and Band-Aid daily until healed.    Seborrheic Keratosis  What causes seborrheic keratoses? Seborrheic keratoses are harmless, common skin growths that first appear during adult life.  As time goes by, more growths appear.  Some people may develop a large number of them.  Seborrheic keratoses appear on both covered and uncovered body parts.  They are not caused by sunlight.  The tendency to develop seborrheic keratoses can be inherited.  They vary in color from skin-colored to gray, brown, or even black.  They can be either smooth or have a rough, warty surface.   Seborrheic keratoses are superficial and look as if they were stuck on the skin.  Under the microscope this type of keratosis looks like layers upon layers of skin.  That is why at times the top layer may seem to fall off, but the rest of the growth remains and re-grows.    Treatment Seborrheic keratoses do not need to be treated, but can easily be removed in the office.  Seborrheic keratoses often cause symptoms when they rub on clothing or jewelry.  Lesions can be in the way of shaving.  If they become inflamed, they can cause itching, soreness, or burning.  Removal of a seborrheic keratosis can be accomplished by freezing, burning, or surgery. If any spot bleeds, scabs, or grows rapidly, please return to have it checked, as these can be an indication of a skin cancer.   Side effects of Otezla (apremilast) include diarrhea, nausea, headache, upper respiratory infection, depression, and weight decrease (5-10%). It should only be taken by pregnant women after a discussion regarding risks and benefits with their doctor. Goal is control of skin condition, not cure.  The use of Rutherford Nail requires long term medication management, including periodic office visits.   Due to recent changes in healthcare laws, you may see results of your pathology and/or laboratory  studies on MyChart before the doctors have had a chance to review them. We understand that in some cases there may be results that are confusing or concerning to you. Please understand that not all results are received at the same time and often the doctors may need to interpret multiple results in order to provide you with the best plan of care or course of treatment. Therefore, we ask that you please give [place] 2 business days to thoroughly review all your results before contacting the office for clarification. Should we see a critical lab result, you will be contacted sooner.   If You Need Anything After Your Visit  If you have any questions or concerns for your doctor, please call our main line at [phone] and press option 4 to reach your doctor's medical assistant. If no one answers, please leave a voicemail as directed and we will return your call as soon as possible. Messages left after 4 pm will be answered the following business day.   You may also send [place] a message via [place]. We typically respond to MyChart messages within 1-2 business days.  For prescription refills, please ask your pharmacy to contact our office. Our fax number is [phone].  If you have an urgent issue when the clinic is closed that cannot wait until the next business day, you can page your doctor at the number below.    Please note that while we do our best to be available for urgent issues outside of office hours, we are not available  24/7.   If you have an urgent issue and are unable to reach [place], you may choose to seek medical care at your doctor's office, retail clinic, urgent care center, or emergency room.  If you have a medical emergency, please immediately call 911 or go to the emergency department.  Pager Numbers  - Dr. Nehemiah Massed: [phone]  - Dr. Laurence Ferrari: [phone]  - Dr. Nicole Kindred: [phone]  In the event of inclement weather, please call our main line at [phone] for an update on the  status of any delays or closures.  Dermatology Medication Tips: Please keep the boxes that topical medications come in in order to help keep track of the instructions about where and how to use these. Pharmacies typically print the medication instructions only on the boxes and not directly on the medication tubes.   If your medication is too expensive, please contact our office at [phone] option 4 or send [place] a message through [place].   We are unable to tell what your co-pay for medications will be in advance as this is different depending on your insurance coverage. However, we may be able to find a substitute medication at lower cost or fill out paperwork to get insurance to cover a needed medication.   If a prior authorization is required to get your medication covered by your insurance company, please allow [place] 1-2 business days to complete this process.  Drug prices often vary depending on where the prescription is filled and some pharmacies may offer cheaper prices.  The website www.goodrx.com contains coupons for medications through different pharmacies. The prices here do not account for what the cost may be with help from insurance (it may be cheaper with your insurance), but the website can give you the price if you did not use any insurance.  - You can print the associated coupon and take it with your prescription to the pharmacy.  - You may also stop by our office during regular business hours and pick up a GoodRx coupon card.  - If you need your prescription sent electronically to a different pharmacy, notify our office through [facility] or by phone at [phone] option 4.     Si Usted Necesita Algo Despus de Su Visita  Tambin puede enviarnos un mensaje a travs de Pharmacist, community. Por lo general respondemos a los mensajes de MyChart en el transcurso de 1 a 2 das hbiles.  Para renovar recetas, por favor pida a su farmacia que se ponga en contacto con nuestra oficina.  Harland Dingwall de fax es [place] [phone].  Si tiene un asunto urgente cuando la clnica est cerrada y que no puede esperar hasta el siguiente da hbil, puede llamar/localizar a su doctor(a) al nmero que aparece a continuacin.   Por favor, tenga en cuenta que aunque hacemos todo lo posible para estar disponibles para asuntos urgentes fuera del horario de [place], no estamos disponibles las 24 horas del da, los 7 das de la [place].   Si tiene un problema urgente y no puede comunicarse con nosotros, puede optar por buscar atencin mdica  en el consultorio de su doctor(a), en una clnica privada, en un centro de atencin urgente o en una sala de emergencias.  Si tiene Engineering geologist, por favor llame inmediatamente al 911 o vaya a la sala de emergencias.  Nmeros de bper  - Dr. Nehemiah Massed: [phone]  - Dra. Moye: [phone]  - Dra. Nicole Kindred: [phone]  En caso de inclemencias del tiempo, por favor llame a  Johnsie Kindred principal al [phone] para una actualizacin sobre el [place] de cualquier retraso o cierre.  Consejos para la medicacin en dermatologa: Por favor, guarde las cajas en las que vienen los medicamentos de uso tpico para ayudarle a seguir las instrucciones sobre dnde y cmo usarlos. Las farmacias generalmente imprimen las instrucciones del medicamento slo en las cajas y no directamente en los tubos del [place].   Si su medicamento es muy caro, por favor, pngase en contacto con Zigmund Daniel llamando al [phone] y presione la opcin 4 o envenos un mensaje a travs de Pharmacist, community.   No podemos decirle cul ser su copago por los medicamentos por adelantado ya que esto es diferente dependiendo de la cobertura de su seguro. Sin embargo, es posible que podamos encontrar un medicamento sustituto a Electrical engineer un formulario para que el seguro cubra el medicamento que se considera necesario.   Si se requiere una autorizacin previa para que su  compaa de seguros [place] su medicamento, por favor permtanos de 1 a 2 das hbiles para completar este proceso.  Los precios de los medicamentos varan con frecuencia dependiendo del Environmental consultant de dnde se surte la receta y alguna farmacias pueden ofrecer precios ms baratos.  El sitio web www.goodrx.com tiene cupones para medicamentos de Airline pilot. Los precios aqu no tienen en cuenta lo que podra costar con la ayuda del seguro (puede ser ms barato con su seguro), pero el sitio web puede darle el precio si no utiliz Research scientist (physical sciences).  - Puede imprimir el cupn correspondiente y llevarlo con su receta a la farmacia.  - Tambin puede pasar por nuestra oficina durante el horario de atencin regular y Charity fundraiser una tarjeta de cupones de GoodRx.  - Si necesita que su receta se enve electrnicamente a una farmacia diferente, informe a nuestra oficina a travs de MyChart de [place] o por telfono llamando al [phone] y presione la opcin 4.

## 2022-07-14 NOTE — Progress Notes (Signed)
Follow-Up Visit   Subjective  Theresa Gross is a 67 y.o. female who presents for the following: Growth (Right temple, noticed early December. Painful, tender when washing face. ). She also has psoriasis of the scalp, face, elbows, much improved with Otezla tablets BID. No side effects. Zoryve cream was not covered by insurance.    The following portions of the chart were reviewed this encounter and updated as appropriate:       Review of Systems:  No other skin or systemic complaints except as noted in HPI or Assessment and Plan.  Objective  Well appearing patient in no apparent distress; mood and affect are within normal limits.  A focused examination was performed including face. Relevant physical exam findings are noted in the Assessment and Plan.  Right Temple Erythematous stuck-on, waxy papule  scalp, face, elbows Pink scaly patch on the occipital scalp.    Assessment & Plan  Actinic Damage - chronic, secondary to cumulative UV radiation exposure/sun exposure over time - diffuse scaly erythematous macules with underlying dyspigmentation - Recommend daily broad spectrum sunscreen SPF 30+ to sun-exposed areas, reapply every 2 hours as needed.  - Recommend staying in the shade or wearing long sleeves, sun glasses (UVA+UVB protection) and wide brim hats (4-inch brim around the entire circumference of the hat). - Call for new or changing lesions.  Inflamed seborrheic keratosis Right Temple  Symptomatic, irritating, patient would like treated.  Destruction of lesion - Right Temple  Destruction method: cryotherapy   Informed consent: discussed and consent obtained   Lesion destroyed using liquid nitrogen: Yes   Region frozen until ice ball extended beyond lesion: Yes   Outcome: patient tolerated procedure well with no complications   Post-procedure details: wound care instructions given   Additional details:  Prior to procedure, discussed risks of blister formation,  small wound, skin dyspigmentation, or rare scar following cryotherapy. Recommend Vaseline ointment to treated areas while healing.   Psoriasis scalp, face, elbows  Chronic condition with duration or expected duration over one year. Currently well-controlled on [place]. Residual area in scalp.  Counseling on psoriasis and coordination of care  psoriasis is a chronic non-curable, but treatable genetic/hereditary disease that may have other systemic features affecting other organ systems such as joints (Psoriatic Arthritis). It is associated with an increased risk of inflammatory bowel disease, heart disease, non-alcoholic fatty liver disease, and depression.  Treatments include light and laser treatments; topical medications; and systemic medications including oral and injectables.  Start Vtama Cream Apply to AA qd psoriasis dsp 60g 3Rf. (Zoryve cream denied) If not covered, will sent in pimecrolimus cream.  Continue Otezla '30mg'$  take 1 po BID dsp #180 1Rf. Side effects of Otezla (apremilast) include diarrhea, nausea, headache, upper respiratory infection, depression, and weight decrease (5-10%). It should only be taken by pregnant women after a discussion regarding risks and benefits with their doctor. Goal is control of skin condition, not cure.  The use of Rutherford Nail requires long term medication management, including periodic office visits. Continue Taclonex solution qhs to scalp prn   Tapinarof (VTAMA) 1 % CREA - scalp, face, elbows Apply to affected areas psoriasis qd/bid prn.  Related Medications ketoconazole (NIZORAL) 2 % cream Apply to affected areas psoriasis on face once to twice daily.  Roflumilast (ZORYVE) 0.3 % CREA Apply 1 Application topically daily. Apply to aa of face and any other areas of body for rash  calcipotriene-betamethasone (TACLONEX) external suspension Apply topically daily.  Apremilast (OTEZLA) 30 MG TABS TAKE  ONE TABLET BY MOUTH TWICE A DAY - ( DO NOT CRUSH,  CHEW, OR SPLIT TABLETS)   Return in about 6 months (around 01/12/2023) for Psoriasis.  IJamesetta Orleans, CMA, am acting as scribe for [name], MD .  Documentation: I have reviewed the above documentation for accuracy and completeness, and I agree with the above.  [name] MD

## 2022-07-23 DIAGNOSIS — E782 Mixed hyperlipidemia: Secondary | ICD-10-CM | POA: Diagnosis not present

## 2022-07-23 DIAGNOSIS — I10 Essential (primary) hypertension: Secondary | ICD-10-CM | POA: Diagnosis not present

## 2022-07-23 DIAGNOSIS — R0789 Other chest pain: Secondary | ICD-10-CM | POA: Diagnosis not present

## 2022-07-23 DIAGNOSIS — I493 Ventricular premature depolarization: Secondary | ICD-10-CM | POA: Diagnosis not present

## 2022-07-23 DIAGNOSIS — R002 Palpitations: Secondary | ICD-10-CM | POA: Diagnosis not present

## 2022-08-03 DIAGNOSIS — R0789 Other chest pain: Secondary | ICD-10-CM | POA: Diagnosis not present

## 2022-08-04 DIAGNOSIS — M62830 Muscle spasm of back: Secondary | ICD-10-CM | POA: Diagnosis not present

## 2022-08-04 DIAGNOSIS — M9903 Segmental and somatic dysfunction of lumbar region: Secondary | ICD-10-CM | POA: Diagnosis not present

## 2022-08-04 DIAGNOSIS — M9905 Segmental and somatic dysfunction of pelvic region: Secondary | ICD-10-CM | POA: Diagnosis not present

## 2022-08-04 DIAGNOSIS — M5136 Other intervertebral disc degeneration, lumbar region: Secondary | ICD-10-CM | POA: Diagnosis not present

## 2022-08-16 ENCOUNTER — Ambulatory Visit: Payer: Medicare Other | Admitting: Dermatology

## 2022-08-24 ENCOUNTER — Other Ambulatory Visit: Payer: Self-pay | Admitting: Physician Assistant

## 2022-08-24 DIAGNOSIS — R079 Chest pain, unspecified: Secondary | ICD-10-CM

## 2022-08-24 DIAGNOSIS — Z8249 Family history of ischemic heart disease and other diseases of the circulatory system: Secondary | ICD-10-CM

## 2022-08-24 DIAGNOSIS — I493 Ventricular premature depolarization: Secondary | ICD-10-CM | POA: Diagnosis not present

## 2022-08-24 DIAGNOSIS — R0789 Other chest pain: Secondary | ICD-10-CM | POA: Diagnosis not present

## 2022-08-24 DIAGNOSIS — E782 Mixed hyperlipidemia: Secondary | ICD-10-CM | POA: Diagnosis not present

## 2022-08-24 DIAGNOSIS — R002 Palpitations: Secondary | ICD-10-CM | POA: Diagnosis not present

## 2022-08-24 DIAGNOSIS — I10 Essential (primary) hypertension: Secondary | ICD-10-CM | POA: Diagnosis not present

## 2022-08-25 ENCOUNTER — Telehealth (HOSPITAL_COMMUNITY): Payer: Self-pay | Admitting: Emergency Medicine

## 2022-08-25 ENCOUNTER — Ambulatory Visit: Payer: Medicare Other

## 2022-08-25 DIAGNOSIS — M9903 Segmental and somatic dysfunction of lumbar region: Secondary | ICD-10-CM | POA: Diagnosis not present

## 2022-08-25 DIAGNOSIS — M9905 Segmental and somatic dysfunction of pelvic region: Secondary | ICD-10-CM | POA: Diagnosis not present

## 2022-08-25 DIAGNOSIS — M5136 Other intervertebral disc degeneration, lumbar region: Secondary | ICD-10-CM | POA: Diagnosis not present

## 2022-08-25 DIAGNOSIS — M62830 Muscle spasm of back: Secondary | ICD-10-CM | POA: Diagnosis not present

## 2022-08-25 DIAGNOSIS — R079 Chest pain, unspecified: Secondary | ICD-10-CM

## 2022-08-25 MED ORDER — METOPROLOL TARTRATE 100 MG PO TABS: 100.0000 mg | ORAL_TABLET | Freq: Once | ORAL | 0 refills | Status: DC

## 2022-08-25 MED ORDER — IVABRADINE HCL 5 MG PO TABS: 15.0000 mg | ORAL_TABLET | Freq: Once | ORAL | 0 refills | Status: AC

## 2022-08-25 NOTE — Telephone Encounter (Signed)
Reaching out to patient to offer assistance regarding upcoming cardiac imaging study; pt verbalizes understanding of appt date/time, parking situation and where to check in, pre-test NPO status and medications ordered, and verified current allergies; name and call back number provided for further questions should they arise Theresa Bond RN Navigator Cardiac Imaging [facility] [phone] office [phone] cell  Arrival 130 [facility] 150m metoprolol + 166mivabradine Difficult iv  Aware contrast/ nitro

## 2022-08-26 ENCOUNTER — Ambulatory Visit
Admission: RE | Admit: 2022-08-26 | Discharge: 2022-08-26 | Disposition: A | Discharge status: Home / Self Care | Payer: Medicare Other | Source: Ambulatory Visit | Attending: Physician Assistant | Admitting: Physician Assistant

## 2022-08-26 ENCOUNTER — Ambulatory Visit: Payer: Medicare Other

## 2022-08-26 DIAGNOSIS — I2510 Atherosclerotic heart disease of native coronary artery without angina pectoris: Secondary | ICD-10-CM | POA: Insufficient documentation

## 2022-08-26 DIAGNOSIS — Z8249 Family history of ischemic heart disease and other diseases of the circulatory system: Secondary | ICD-10-CM | POA: Insufficient documentation

## 2022-08-26 DIAGNOSIS — R079 Chest pain, unspecified: Secondary | ICD-10-CM | POA: Diagnosis not present

## 2022-08-26 DIAGNOSIS — I10 Essential (primary) hypertension: Secondary | ICD-10-CM | POA: Diagnosis not present

## 2022-08-26 DIAGNOSIS — E782 Mixed hyperlipidemia: Secondary | ICD-10-CM | POA: Diagnosis not present

## 2022-08-26 LAB — POCT I-STAT CREATININE: Creatinine, Ser: 0.8 mg/dL (ref 0.44–1.00)

## 2022-08-26 MED ORDER — NITROGLYCERIN 0.4 MG SL SUBL
0.8000 mg | SUBLINGUAL_TABLET | Freq: Once | SUBLINGUAL | Status: AC
  Administered 2022-08-26: 0.8 mg via SUBLINGUAL

## 2022-08-26 MED ORDER — IOHEXOL 350 MG/ML SOLN
100.0000 mL | Freq: Once | INTRAVENOUS | Status: AC | PRN
  Administered 2022-08-26: 100 mL via INTRAVENOUS

## 2022-08-26 NOTE — Progress Notes (Signed)
Patient tolerated CT well. Drank water after. Vital signs stable encourage to drink water throughout day.Reasons explained and verbalized understanding. Ambulated steady gait.  

## 2022-09-06 ENCOUNTER — Other Ambulatory Visit: Payer: Self-pay | Admitting: Family Medicine

## 2022-09-06 DIAGNOSIS — R0609 Other forms of dyspnea: Secondary | ICD-10-CM | POA: Diagnosis not present

## 2022-09-06 DIAGNOSIS — I10 Essential (primary) hypertension: Secondary | ICD-10-CM

## 2022-09-06 DIAGNOSIS — E782 Mixed hyperlipidemia: Secondary | ICD-10-CM | POA: Diagnosis not present

## 2022-09-06 DIAGNOSIS — R0789 Other chest pain: Secondary | ICD-10-CM | POA: Diagnosis not present

## 2022-09-09 ENCOUNTER — Ambulatory Visit (INDEPENDENT_AMBULATORY_CARE_PROVIDER_SITE_OTHER): Payer: Medicare Other

## 2022-09-09 VITALS — Ht 66.0 in | Wt 222.0 lb

## 2022-09-09 DIAGNOSIS — Z0000 Encounter for general adult medical examination without abnormal findings: Secondary | ICD-10-CM | POA: Diagnosis not present

## 2022-09-09 NOTE — Patient Instructions (Signed)
Ms. Ahlert , Thank you for taking time to come for your Medicare Wellness Visit. I appreciate your ongoing commitment to your health goals. Please review the following plan we discussed and let me know if I can assist you in the future.   These are the goals we discussed:  Goals      DIET - EAT MORE FRUITS AND VEGETABLES     DIET - INCREASE WATER INTAKE     Recommend drinking 6-8 glasses of water per day         This is a list of the screening recommended for you and due dates:  Health Maintenance  Topic Date Due   DTaP/Tdap/Td vaccine (1 - Tdap) Never done   COVID-19 Vaccine (4 - 2023-24 season) 03/05/2022   Medicare Annual Wellness Visit  09/09/2023   Mammogram  09/17/2023   Colon Cancer Screening  12/16/2024   Pneumonia Vaccine  Completed   Flu Shot  Completed   DEXA scan (bone density measurement)  Completed   Hepatitis C Screening: USPSTF Recommendation to screen - Ages 17-79 yo.  Completed   Zoster (Shingles) Vaccine  Completed   HPV Vaccine  Aged Out    Advanced directives: no  Conditions/risks identified: none  Next appointment: Follow up in one year for your annual wellness visit 09/14/23 @ 2:30 pm by phone   Preventive Care 65 Years and Older, Female Preventive care refers to lifestyle choices and visits with your health care provider that can promote health and wellness. What does preventive care include? A yearly physical exam. This is also called an annual well check. Dental exams once or twice a year. Routine eye exams. Ask your health care provider how often you should have your eyes checked. Personal lifestyle choices, including: Daily care of your teeth and gums. Regular physical activity. Eating a healthy diet. Avoiding tobacco and drug use. Limiting alcohol use. Practicing safe sex. Taking low-dose aspirin every day. Taking vitamin and mineral supplements as recommended by your health care provider. What happens during an annual well check? The  services and screenings done by your health care provider during your annual well check will depend on your age, overall health, lifestyle risk factors, and family history of disease. Counseling  Your health care provider may ask you questions about your: Alcohol use. Tobacco use. Drug use. Emotional well-being. Home and relationship well-being. Sexual activity. Eating habits. History of falls. Memory and ability to understand (cognition). Work and work Statistician. Reproductive health. Screening  You may have the following tests or measurements: Height, weight, and BMI. Blood pressure. Lipid and cholesterol levels. These may be checked every 5 years, or more frequently if you are over 20 years old. Skin check. Lung cancer screening. You may have this screening every year starting at age 37 if you have a 30-pack-year history of smoking and currently smoke or have quit within the past 15 years. Fecal occult blood test (FOBT) of the stool. You may have this test every year starting at age 60. Flexible sigmoidoscopy or colonoscopy. You may have a sigmoidoscopy every 5 years or a colonoscopy every 10 years starting at age 32. Hepatitis C blood test. Hepatitis B blood test. Sexually transmitted disease (STD) testing. Diabetes screening. This is done by checking your blood sugar (glucose) after you have not eaten for a while (fasting). You may have this done every 1-3 years. Bone density scan. This is done to screen for osteoporosis. You may have this done starting at age 57. Mammogram.  This may be done every 1-2 years. Talk to your health care provider about how often you should have regular mammograms. Talk with your health care provider about your test results, treatment options, and if necessary, the need for more tests. Vaccines  Your health care provider may recommend certain vaccines, such as: Influenza vaccine. This is recommended every year. Tetanus, diphtheria, and acellular  pertussis (Tdap, Td) vaccine. You may need a Td booster every 10 years. Zoster vaccine. You may need this after age 13. Pneumococcal 13-valent conjugate (PCV13) vaccine. One dose is recommended after age 77. Pneumococcal polysaccharide (PPSV23) vaccine. One dose is recommended after age 16. Talk to your health care provider about which screenings and vaccines you need and how often you need them. This information is not intended to replace advice given to you by your health care provider. Make sure you discuss any questions you have with your health care provider. Document Released: 07/18/2015 Document Revised: 03/10/2016 Document Reviewed: 04/22/2015 Elsevier Interactive Patient Education  2017 [place] Prevention in the Home Falls can cause injuries. They can happen to people of all ages. There are many things you can do to make your home safe and to help prevent falls. What can I do on the outside of my home? Regularly fix the edges of walkways and driveways and fix any cracks. Remove anything that might make you trip as you walk through a door, such as a raised step or threshold. Trim any bushes or trees on the path to your home. Use bright outdoor lighting. Clear any walking paths of anything that might make someone trip, such as rocks or tools. Regularly check to see if handrails are loose or broken. Make sure that both sides of any steps have handrails. Any raised decks and porches should have guardrails on the edges. Have any leaves, snow, or ice cleared regularly. Use sand or salt on walking paths during winter. Clean up any spills in your garage right away. This includes oil or grease spills. What can I do in the bathroom? Use night lights. Install grab bars by the toilet and in the tub and shower. Do not use towel bars as grab bars. Use non-skid mats or decals in the tub or shower. If you need to sit down in the shower, use a plastic, non-slip stool. Keep the floor  dry. Clean up any water that spills on the floor as soon as it happens. Remove soap buildup in the tub or shower regularly. Attach bath mats securely with double-sided non-slip rug tape. Do not have throw rugs and other things on the floor that can make you trip. What can I do in the bedroom? Use night lights. Make sure that you have a light by your bed that is easy to reach. Do not use any sheets or blankets that are too big for your bed. They should not hang down onto the floor. Have a firm chair that has side arms. You can use this for support while you get dressed. Do not have throw rugs and other things on the floor that can make you trip. What can I do in the kitchen? Clean up any spills right away. Avoid walking on wet floors. Keep items that you use a lot in easy-to-reach places. If you need to reach something above you, use a strong step stool that has a grab bar. Keep electrical cords out of the way. Do not use floor polish or wax that makes floors slippery. If you  must use wax, use non-skid floor wax. Do not have throw rugs and other things on the floor that can make you trip. What can I do with my stairs? Do not leave any items on the stairs. Make sure that there are handrails on both sides of the stairs and use them. Fix handrails that are broken or loose. Make sure that handrails are as long as the stairways. Check any carpeting to make sure that it is firmly attached to the stairs. Fix any carpet that is loose or worn. Avoid having throw rugs at the top or bottom of the stairs. If you do have throw rugs, attach them to the floor with carpet tape. Make sure that you have a light switch at the top of the stairs and the bottom of the stairs. If you do not have them, ask someone to add them for you. What else can I do to help prevent falls? Wear shoes that: Do not have high heels. Have rubber bottoms. Are comfortable and fit you well. Are closed at the toe. Do not wear  sandals. If you use a stepladder: Make sure that it is fully opened. Do not climb a closed stepladder. Make sure that both sides of the stepladder are locked into place. Ask someone to hold it for you, if possible. Clearly mark and make sure that you can see: Any grab bars or handrails. First and last steps. Where the edge of each step is. Use tools that help you move around (mobility aids) if they are needed. These include: Canes. Walkers. Scooters. Crutches. Turn on the lights when you go into a dark area. Replace any light bulbs as soon as they burn out. Set up your furniture so you have a clear path. Avoid moving your furniture around. If any of your floors are uneven, fix them. If there are any pets around you, be aware of where they are. Review your medicines with your doctor. Some medicines can make you feel dizzy. This can increase your chance of falling. Ask your doctor what other things that you can do to help prevent falls. This information is not intended to replace advice given to you by your health care provider. Make sure you discuss any questions you have with your health care provider. Document Released: 04/17/2009 Document Revised: 11/27/2015 Document Reviewed: 07/26/2014 Elsevier Interactive Patient Education  2017 [employer].

## 2022-09-09 NOTE — Progress Notes (Signed)
I connected with  Jarome Matin Purtle on 09/09/22 by a audio enabled telemedicine application and verified that I am speaking with the correct person using two identifiers.  Patient Location: Home  Provider Location: Office/Clinic  I discussed the limitations of evaluation and management by telemedicine. The patient expressed understanding and agreed to proceed.  Subjective:   Theresa Gross is a 67 y.o. female who presents for Medicare Annual (Subsequent) preventive examination.  Review of Systems     Cardiac Risk Factors include: advanced age (>40mn, >>97women);dyslipidemia;hypertension;obesity (BMI >30kg/m2)     Objective:    There were no vitals filed for this visit. There is no height or weight on file to calculate BMI.     09/09/2022    9:44 AM 08/24/2021    8:17 AM 08/20/2020    8:24 AM 12/17/2019    7:48 AM 02/21/2018    1:10 PM 06/16/2016    9:57 AM  Advanced Directives  Does Patient Have a Medical Advance Directive? No Yes Yes Yes Yes Yes  Type of ACorporate treasurerof AGreat [place]Living will [place] will [place] Lakes[place] will [place] will Out of facility DNR (pink MOST or yellow form)  Copy of [place]?  No - copy requested No - copy requested Yes - validated most recent copy scanned in chart (See row information)    Would patient like information on creating a medical advance directive? No - Patient declined         Current Medications (verified) Outpatient Encounter Medications as of 09/09/2022  Medication Sig   Apremilast (OTEZLA) 30 MG TABS TAKE ONE TABLET BY MOUTH TWICE A DAY - ( DO NOT CRUSH, CHEW, OR SPLIT TABLETS)   calcipotriene-betamethasone (TACLONEX) external suspension Apply topically daily.   Calcium-Phosphorus-Vitamin D (CITRACAL +D3 PO) Take 2 tablets by mouth daily.   Cholecalciferol (VITAMIN D) 2000 units tablet Take 2,000 Units by mouth  daily.   co-enzyme Q-10 30 MG capsule Take 30 mg by mouth daily.   DELZICOL 400 MG CPDR DR capsule Take 2 tablets by mouth 2 (two) times daily. GI Dr   ketoconazole (NIZORAL) 2 % cream Apply to affected areas psoriasis on face once to twice daily.   lisinopril-hydrochlorothiazide (ZESTORETIC) 10-12.5 MG tablet TAKE 1 TABLET BY MOUTH EVERY DAY   meclizine (ANTIVERT) 25 MG tablet Take 1 tablet (25 mg total) by mouth 3 (three) times daily as needed for dizziness.   Multiple Vitamins-Minerals (MULTIVITAMIN WOMEN) TABS Take by mouth.   nystatin (MYCOSTATIN/NYSTOP) powder Apply 2 to 3 times daily as needed for rash at lower abdominal folds   nystatin-triamcinolone (MYCOLOG II) cream Apply 1 application topically 2 (two) times daily. PRN/ Derm   pantoprazole (PROTONIX) 40 MG tablet Take by mouth.   promethazine-dextromethorphan (PROMETHAZINE-DM) 6.25-15 MG/5ML syrup Take 5 mLs by mouth 4 (four) times daily as needed for cough.   rosuvastatin (CRESTOR) 20 MG tablet Take 1 tablet (20 mg total) by mouth daily.   triamcinolone cream (KENALOG) 0.1 % Apply 1 application topically as directed. Qd to bid aa itching on back until clear, avoid face, groin, axilla   metoprolol tartrate (LOPRESSOR) 100 MG tablet Take 1 tablet (100 mg total) by mouth once for 1 dose. Please take one time dose '100mg'$  metoprolol tartrate 2 hr prior to cardiac CT for HR control IF HR >55bpm.   Roflumilast (ZORYVE) 0.3 % CREA Apply 1 Application topically daily. Apply to aa  of face and any other areas of body for rash (Patient not taking: Reported on 04/19/2022)   Tapinarof (VTAMA) 1 % CREA Apply to affected areas psoriasis qd/bid prn. (Patient not taking: Reported on 09/09/2022)   No facility-administered encounter medications on file as of 09/09/2022.    Allergies (verified) Ciprofloxacin, Lipitor [atorvastatin], Metronidazole, and Nsaids   History: Past Medical History:  Diagnosis Date   Cervical cancer [facility])    age 67    Diverticulosis    Dysplastic nevus 11/29/2007   Right back. Severe atypia, extends to margin. Excised: 12/22/2007, margins free.   GERD (gastroesophageal reflux disease)    Hemorrhoids    History of colon polyps    Hyperlipemia    Hypertension    Left sided colitis ([place])    Psoriasis    Taking Otezla   Vitamin D deficiency    Past Surgical History:  Procedure Laterality Date   ABDOMINAL HYSTERECTOMY     BREAST BIOPSY Right 10 plus yrs ago   stereo bx./clip. Benign   CHOLECYSTECTOMY     COLONOSCOPY WITH PROPOFOL N/A 06/16/2016   Procedure: COLONOSCOPY WITH PROPOFOL;  Surgeon: Manya Silvas, MD;  Location: [facility] ENDOSCOPY;  Service: Endoscopy;  Laterality: N/A;   COLONOSCOPY WITH PROPOFOL N/A 12/17/2019   Procedure: COLONOSCOPY WITH PROPOFOL;  Surgeon: Toledo, Benay Pike, MD;  Location: [facility] ENDOSCOPY;  Service: Gastroenterology;  Laterality: N/A;   DIAGNOSTIC LAPAROSCOPY     ESOPHAGOGASTRODUODENOSCOPY (EGD) WITH PROPOFOL N/A 12/17/2019   Procedure: ESOPHAGOGASTRODUODENOSCOPY (EGD) WITH PROPOFOL;  Surgeon: Toledo, Benay Pike, MD;  Location: [facility] ENDOSCOPY;  Service: Gastroenterology;  Laterality: N/A;   EYE SURGERY     GALLBLADDER SURGERY  06/04/2014   TOTAL ABDOMINAL HYSTERECTOMY     UPPER GASTROINTESTINAL ENDOSCOPY     Family History  Problem Relation Age of Onset   Colon cancer Mother    Diabetes Father    Breast cancer Neg Hx    Social History   Socioeconomic History   Marital status: Married    Spouse name: Not on file   Number of children: 1   Years of education: Not on file   Highest education level: Not on file  Occupational History   Occupation: retired  Tobacco Use   Smoking status: Former    Types: Cigarettes   Smokeless tobacco: Never  Vaping Use   Vaping Use: Never used  Substance and Sexual Activity   Alcohol use: Yes    Alcohol/week: 1.0 standard drink of alcohol    Types: 1 Cans of beer per week   Drug use: No   Sexual activity: Yes    Birth  control/protection: Surgical  Other Topics Concern   Not on file  Social History Narrative   Not on file   Social Determinants of Health   Financial Resource Strain: Low Risk  (09/09/2022)   Overall Financial Resource Strain (CARDIA)    Difficulty of Paying Living Expenses: Not hard at all  Food Insecurity: No Food Insecurity (09/09/2022)   Hunger Vital Sign    Worried About Running Out of Food in the Last Year: Never true    Ran Out of Food in the Last Year: Never true  Transportation Needs: No Transportation Needs (09/09/2022)   PRAPARE - Hydrologist (Medical): No    Lack of Transportation (Non-Medical): No  Physical Activity: Insufficiently Active (09/09/2022)   Exercise Vital Sign    Days of Exercise per Week: 2 days    Minutes of Exercise  per Session: 20 min  Stress: No Stress Concern Present (09/09/2022)   Liborio Negron Torres    Feeling of Stress : Only a little  Social Connections: Moderately Integrated (09/09/2022)   Social Connection and Isolation Panel [NHANES]    Frequency of Communication with Friends and Family: More than three times a week    Frequency of Social Gatherings with Friends and Family: More than three times a week    Attends Religious Services: Never    Marine scientist or Organizations: Yes    Attends Music therapist: More than 4 times per year    Marital Status: Married    Tobacco Counseling Counseling given: Not Answered   Clinical Intake:  Pre-visit preparation completed: Yes  Pain : No/denies pain     Nutritional Risks: None Diabetes: No  How often do you need to have someone help you when you read instructions, pamphlets, or other written materials from your doctor or pharmacy?: 1 - Never  Diabetic?no  Interpreter Needed?: No  Information entered by :: Kirke Shaggy, LPN   Activities of Daily Living    09/09/2022    9:45 AM  In  your present state of health, do you have any difficulty performing the following activities:  Hearing? 0  Vision? 0  Difficulty concentrating or making decisions? 0  Walking or climbing stairs? 0  Dressing or bathing? 0  Doing errands, shopping? 0  Preparing Food and eating ? N  Using the Toilet? N  In the past six months, have you accidently leaked urine? N  Do you have problems with loss of bowel control? N  Managing your Medications? N  Managing your Finances? N  Housekeeping or managing your Housekeeping? N    Patient Care Team: Juline Patch, MD as PCP - General (Family Medicine) Iris Pert, [place] as Referring Physician (Chiropractic Medicine) Algernon Huxley, MD as Referring Physician (Vascular Surgery) Samara Deist, DPM as Referring Physician (Podiatry) Reeves Forth (Gastroenterology) [name], MD (Dermatology) Leandrew Koyanagi, MD as Referring Physician (Ophthalmology)  Indicate any recent Medical Services you may have received from other than [facility] providers in the past year (date may be approximate).     Assessment:   This is a routine wellness examination for [place].  Hearing/Vision screen Hearing Screening - Comments:: No aids Vision Screening - Comments:: No glasses= [facility]  Dietary issues and exercise activities discussed: Current Exercise Habits: Home exercise routine, Type of exercise: walking, Time (Minutes): 20, Frequency (Times/Week): 2, Weekly Exercise (Minutes/Week): 40, Intensity: Mild   Goals Addressed             This Visit's Progress    DIET - EAT MORE FRUITS AND VEGETABLES         Depression Screen    09/09/2022    9:43 AM 04/19/2022    9:37 AM 10/14/2021   10:15 AM 08/24/2021    8:16 AM 04/15/2021    9:19 AM 10/22/2020    9:25 AM 08/20/2020    8:23 AM  PHQ 2/9 Scores  PHQ - 2 Score 0 0 0 0 0 0 0  PHQ- 9 Score 0 0 0  0 0     Fall Risk    09/09/2022    9:45 AM 04/19/2022    9:37 AM 08/24/2021    8:18 AM  08/20/2020    8:24 AM 07/29/2020    9:45 AM  Fall Risk   Falls in the  past year? 0 0 0 0 0  Number falls in past yr: 0 0 0 0   Injury with Fall? 0 0 0 0   Risk for fall due to : No Fall Risks No Fall Risks No Fall Risks No Fall Risks   Follow up Falls prevention discussed;Falls evaluation completed Falls evaluation completed Falls prevention discussed Falls prevention discussed Falls evaluation completed    FALL RISK PREVENTION PERTAINING TO THE HOME:  Any stairs in or around the home? Yes  If so, are there any without handrails? No  Home free of loose throw rugs in walkways, pet beds, electrical cords, etc? Yes  Adequate lighting in your home to reduce risk of falls? Yes   ASSISTIVE DEVICES UTILIZED TO PREVENT FALLS:  Life alert? No  Use of a cane, walker or w/c? No  Grab bars in the bathroom? Yes  Shower chair or bench in shower? Yes  Elevated toilet seat or a handicapped toilet? No    Cognitive Function:        09/09/2022    9:50 AM  6CIT Screen  What Year? 0 points  What month? 0 points  What time? 0 points  Count back from 20 0 points  Months in reverse 0 points  Repeat phrase 0 points  Total Score 0 points    Immunizations Immunization History  Administered Date(s) Administered   Fluad Quad(high Dose 65+) 04/25/2019, 04/15/2021, 04/19/2022   Influenza,inj,Quad PF,6+ Mos 02/16/2017, 04/17/2018   Influenza-Unspecified 04/18/2014, 04/07/2020   PFIZER(Purple Top)SARS-COV-2 Vaccination 08/25/2019, 09/15/2019, 06/26/2020   PNEUMOCOCCAL CONJUGATE-20 04/15/2021   Zoster Recombinat (Shingrix) 04/07/2020, 06/22/2021    TDAP status: Due, Education has been provided regarding the importance of this vaccine. Advised may receive this vaccine at local pharmacy or Health Dept. Aware to provide a copy of the vaccination record if obtained from local pharmacy or Health Dept. Verbalized acceptance and understanding.  Flu Vaccine status: Up to date  Pneumococcal vaccine  status: Up to date  Covid-19 vaccine status: Completed vaccines  Qualifies for Shingles Vaccine? Yes   Zostavax completed No   Shingrix Completed?: Yes  Screening Tests Health Maintenance  Topic Date Due   DTaP/Tdap/Td (1 - Tdap) Never done   COVID-19 Vaccine (4 - 2023-24 season) 03/05/2022   Medicare Annual Wellness (AWV)  09/09/2023   MAMMOGRAM  09/17/2023   COLONOSCOPY (Pts 45-61yr Insurance coverage will need to be confirmed)  12/16/2024   Pneumonia Vaccine 68 Years old  Completed   INFLUENZA VACCINE  Completed   DEXA SCAN  Completed   Hepatitis C Screening  Completed   Zoster Vaccines- Shingrix  Completed   HPV VACCINES  Aged Out    Health Maintenance  Health Maintenance Due  Topic Date Due   DTaP/Tdap/Td (1 - Tdap) Never done   COVID-19 Vaccine (4 - 2023-24 season) 03/05/2022    Colorectal cancer screening: Type of screening: Colonoscopy. Completed 12/17/19. Repeat every 5 years  Mammogram status: Completed 09/16/21. Repeat every year  Bone Density status: Completed 07/14/20. Results reflect: Bone density results: OSTEOPENIA. Repeat every 5 years.  Lung Cancer Screening: (Low Dose CT Chest recommended if Age 68-80years, 30 pack-year currently smoking OR have quit w/in 15years.) does not qualify.    Additional Screening:  Hepatitis C Screening: does qualify; Completed 05/25/16  Vision Screening: Recommended annual ophthalmology exams for early detection of glaucoma and other disorders of the eye. Is the patient up to date with their annual eye exam?  Yes  Who is the  provider or what is the name of the office in which the patient attends annual eye exams? [facility] If pt is not established with a provider, would they like to be referred to a provider to establish care? No .   Dental Screening: Recommended annual dental exams for proper oral hygiene  Community Resource Referral / Chronic Care Management: CRR required this visit?  No   CCM required this  visit?  No      Plan:     I have personally reviewed and noted the following in the patient's chart:   Medical and social history Use of alcohol, tobacco or illicit drugs  Current medications and supplements including opioid prescriptions. Patient is not currently taking opioid prescriptions. Functional ability and status Nutritional status Physical activity Advanced directives List of other physicians Hospitalizations, surgeries, and ER visits in previous 12 months Vitals Screenings to include cognitive, depression, and falls Referrals and appointments  In addition, I have reviewed and discussed with patient certain preventive protocols, quality metrics, and best practice recommendations. A written personalized care plan for preventive services as well as general preventive health recommendations were provided to patient.     Dionisio David, LPN   [id number]   Nurse Notes: none

## 2022-09-10 DIAGNOSIS — K5150 Left sided colitis without complications: Secondary | ICD-10-CM | POA: Diagnosis not present

## 2022-09-10 DIAGNOSIS — R0789 Other chest pain: Secondary | ICD-10-CM | POA: Diagnosis not present

## 2022-09-10 DIAGNOSIS — K449 Diaphragmatic hernia without obstruction or gangrene: Secondary | ICD-10-CM | POA: Diagnosis not present

## 2022-09-10 DIAGNOSIS — R6881 Early satiety: Secondary | ICD-10-CM | POA: Diagnosis not present

## 2022-09-10 DIAGNOSIS — R110 Nausea: Secondary | ICD-10-CM | POA: Diagnosis not present

## 2022-09-10 DIAGNOSIS — R1013 Epigastric pain: Secondary | ICD-10-CM | POA: Diagnosis not present

## 2022-09-10 DIAGNOSIS — K219 Gastro-esophageal reflux disease without esophagitis: Secondary | ICD-10-CM | POA: Diagnosis not present

## 2022-09-16 ENCOUNTER — Ambulatory Visit: Admission: RE | Admit: 2022-09-16 | Payer: Medicare Other | Source: Ambulatory Visit

## 2022-09-16 DIAGNOSIS — M9905 Segmental and somatic dysfunction of pelvic region: Secondary | ICD-10-CM | POA: Diagnosis not present

## 2022-09-16 DIAGNOSIS — M5136 Other intervertebral disc degeneration, lumbar region: Secondary | ICD-10-CM | POA: Diagnosis not present

## 2022-09-16 DIAGNOSIS — M62830 Muscle spasm of back: Secondary | ICD-10-CM | POA: Diagnosis not present

## 2022-09-16 DIAGNOSIS — M9903 Segmental and somatic dysfunction of lumbar region: Secondary | ICD-10-CM | POA: Diagnosis not present

## 2022-09-21 ENCOUNTER — Ambulatory Visit (INDEPENDENT_AMBULATORY_CARE_PROVIDER_SITE_OTHER): Payer: Medicare Other | Admitting: Family Medicine

## 2022-09-21 ENCOUNTER — Encounter: Payer: Self-pay | Admitting: Family Medicine

## 2022-09-21 VITALS — BP 120/78 | HR 68 | Ht 66.0 in | Wt 219.0 lb

## 2022-09-21 DIAGNOSIS — E7800 Pure hypercholesterolemia, unspecified: Secondary | ICD-10-CM

## 2022-09-21 DIAGNOSIS — R7303 Prediabetes: Secondary | ICD-10-CM | POA: Diagnosis not present

## 2022-09-21 DIAGNOSIS — H8309 Labyrinthitis, unspecified ear: Secondary | ICD-10-CM | POA: Diagnosis not present

## 2022-09-21 DIAGNOSIS — I10 Essential (primary) hypertension: Secondary | ICD-10-CM

## 2022-09-21 DIAGNOSIS — L304 Erythema intertrigo: Secondary | ICD-10-CM | POA: Diagnosis not present

## 2022-09-21 MED ORDER — MECLIZINE HCL 25 MG PO TABS: 25.0000 mg | ORAL_TABLET | Freq: Three times a day (TID) | ORAL | 1 refills | Status: DC | PRN

## 2022-09-21 MED ORDER — NYSTATIN 100000 UNIT/GM EX POWD: CUTANEOUS | 3 refills | Status: DC

## 2022-09-21 MED ORDER — NYSTATIN 100000 UNIT/GM EX POWD: 1.0000 | Freq: Three times a day (TID) | CUTANEOUS | 0 refills | Status: DC

## 2022-09-21 MED ORDER — ROSUVASTATIN CALCIUM 20 MG PO TABS: 20.0000 mg | ORAL_TABLET | Freq: Every day | ORAL | 1 refills | Status: DC

## 2022-09-21 MED ORDER — LISINOPRIL-HYDROCHLOROTHIAZIDE 10-12.5 MG PO TABS: 1.0000 | ORAL_TABLET | Freq: Every day | ORAL | 1 refills | Status: DC

## 2022-09-21 NOTE — Progress Notes (Signed)
Date:  09/21/2022   Name:  Theresa Gross   DOB:  01-Jan-1956   MRN:  [id number]   Chief Complaint: Hyperlipidemia, Hypertension, and Dizziness  Hyperlipidemia This is a chronic problem. The current episode started more than 1 year ago. The problem is controlled. Recent lipid tests were reviewed and are normal. She has no history of chronic renal disease, diabetes or obesity. Pertinent negatives include no chest pain, focal sensory loss, focal weakness, leg pain, myalgias or shortness of breath. Current antihyperlipidemic treatment includes statins. The current treatment provides moderate improvement of lipids. There are no compliance problems.   Hypertension This is a chronic problem. The current episode started more than 1 year ago. The problem has been gradually improving since onset. The problem is controlled. Pertinent negatives include no blurred vision, chest pain, headaches, orthopnea, palpitations, PND or shortness of breath. There are no associated agents to hypertension. There are no known risk factors for coronary artery disease. Past treatments include ACE inhibitors and diuretics. The current treatment provides moderate improvement. There are no compliance problems.  There is no history of CAD/MI or CVA. There is no history of chronic renal disease, a hypertension causing med or renovascular disease.  Dizziness This is a recurrent problem. The current episode started more than 1 year ago (intense x months). The problem has been gradually worsening. Associated symptoms include abdominal pain. Pertinent negatives include no chest pain, coughing, fatigue, fever, headaches, myalgias or weakness. Nothing aggravates the symptoms. Treatments tried: meclizine. The treatment provided mild (" it works") relief.    Lab Results  Component Value Date   NA 142 04/19/2022   K 4.6 04/19/2022   CO2 25 04/19/2022   GLUCOSE 97 04/19/2022   BUN 8 04/19/2022   CREATININE 0.80 08/26/2022   CALCIUM  9.8 04/19/2022   EGFR 76 04/19/2022   GFRNONAA 90 06/13/2019   Lab Results  Component Value Date   CHOL 157 04/19/2022   HDL 58 04/19/2022   LDLCALC 65 04/19/2022   TRIG 206 (H) 04/19/2022   CHOLHDL 4.6 (H) 08/23/2017   No results found for: "TSH" No results found for: "HGBA1C" Lab Results  Component Value Date   WBC 6.9 05/17/2013   HGB 13.7 05/17/2013   HCT 39.4 05/17/2013   MCV 94 05/17/2013   PLT 241 05/17/2013   Lab Results  Component Value Date   ALT 25 04/19/2022   AST 28 04/19/2022   ALKPHOS 66 04/19/2022   BILITOT 0.7 04/19/2022   No results found for: "25OHVITD2", "25OHVITD3", "VD25OH"   Review of Systems  Constitutional:  Negative for fatigue and fever.  HENT:  Negative for trouble swallowing.   Eyes:  Negative for blurred vision and visual disturbance.  Respiratory:  Negative for cough, choking, shortness of breath and wheezing.   Cardiovascular:  Negative for chest pain, palpitations, orthopnea, leg swelling and PND.  Gastrointestinal:  Positive for abdominal pain. Negative for abdominal distention, constipation and diarrhea.  Endocrine: Negative for polydipsia and polyuria.  Genitourinary:  Negative for difficulty urinating, menstrual problem and vaginal bleeding.  Musculoskeletal:  Negative for myalgias.  Neurological:  Positive for dizziness. Negative for focal weakness, weakness, light-headedness and headaches.  Hematological:  Negative for adenopathy. Does not bruise/bleed easily.    Patient Active Problem List   Diagnosis Date Noted   History of total abdominal hysterectomy 10/14/2021   Varicose veins of leg with pain, left 11/04/2020   Varicose veins of leg with pain, right 10/07/2020   Osteopenia  08/07/2020   Class 2 obesity due to excess calories without serious comorbidity with body mass index (BMI) of 37.0 to 37.9 in adult 02/16/2017   Pure hypercholesterolemia 02/16/2017   Aortic atherosclerosis ([place]) 05/25/2016   Steatosis of liver  05/25/2016   Mixed hyperlipidemia 05/25/2016   Essential (primary) hypertension 10/28/2014   Gastro-esophageal reflux disease without esophagitis 10/28/2014   H/O psoriasis 10/28/2014   Decreased potassium in the blood 10/28/2014   Familial multiple lipoprotein-type hyperlipidemia 10/28/2014    Allergies  Allergen Reactions   Ciprofloxacin Nausea And Vomiting   Lipitor [Atorvastatin] Other (See Comments)    Body Aches   Metronidazole Nausea And Vomiting   Nsaids Other (See Comments)    Have to be careful due to liver    Past Surgical History:  Procedure Laterality Date   ABDOMINAL HYSTERECTOMY     BREAST BIOPSY Right 10 plus yrs ago   stereo bx./clip. Benign   CHOLECYSTECTOMY     COLONOSCOPY WITH PROPOFOL N/A 06/16/2016   Procedure: COLONOSCOPY WITH PROPOFOL;  Surgeon: Manya Silvas, MD;  Location: [facility] ENDOSCOPY;  Service: Endoscopy;  Laterality: N/A;   COLONOSCOPY WITH PROPOFOL N/A 12/17/2019   Procedure: COLONOSCOPY WITH PROPOFOL;  Surgeon: Toledo, Benay Pike, MD;  Location: [facility] ENDOSCOPY;  Service: Gastroenterology;  Laterality: N/A;   DIAGNOSTIC LAPAROSCOPY     ESOPHAGOGASTRODUODENOSCOPY (EGD) WITH PROPOFOL N/A 12/17/2019   Procedure: ESOPHAGOGASTRODUODENOSCOPY (EGD) WITH PROPOFOL;  Surgeon: Toledo, Benay Pike, MD;  Location: [facility] ENDOSCOPY;  Service: Gastroenterology;  Laterality: N/A;   EYE SURGERY     GALLBLADDER SURGERY  06/04/2014   TOTAL ABDOMINAL HYSTERECTOMY     UPPER GASTROINTESTINAL ENDOSCOPY      Social History   Tobacco Use   Smoking status: Former    Types: Cigarettes   Smokeless tobacco: Never  Vaping Use   Vaping Use: Never used  Substance Use Topics   Alcohol use: Yes    Alcohol/week: 1.0 standard drink of alcohol    Types: 1 Cans of beer per week   Drug use: No     Medication list has been reviewed and updated.  Current Meds  Medication Sig   Apremilast (OTEZLA) 30 MG TABS TAKE ONE TABLET BY MOUTH TWICE A DAY - ( DO NOT CRUSH, CHEW, OR  SPLIT TABLETS)   calcipotriene-betamethasone (TACLONEX) external suspension Apply topically daily.   Calcium-Phosphorus-Vitamin D (CITRACAL +D3 PO) Take 2 tablets by mouth daily.   Cholecalciferol (VITAMIN D) 2000 units tablet Take 2,000 Units by mouth daily.   co-enzyme Q-10 30 MG capsule Take 30 mg by mouth daily.   Dexlansoprazole 30 MG capsule DR Take 30 mg by mouth daily. GI   ketoconazole (NIZORAL) 2 % cream Apply to affected areas psoriasis on face once to twice daily.   lisinopril-hydrochlorothiazide (ZESTORETIC) 10-12.5 MG tablet TAKE 1 TABLET BY MOUTH EVERY DAY   meclizine (ANTIVERT) 25 MG tablet Take 1 tablet (25 mg total) by mouth 3 (three) times daily as needed for dizziness.   metoCLOPramide (REGLAN) 5 MG tablet Take 5 mg by mouth at bedtime as needed. GI   Multiple Vitamins-Minerals (MULTIVITAMIN WOMEN) TABS Take by mouth.   nystatin (MYCOSTATIN/NYSTOP) powder Apply 2 to 3 times daily as needed for rash at lower abdominal folds   nystatin-triamcinolone (MYCOLOG II) cream Apply 1 application topically 2 (two) times daily. PRN/ Derm   Roflumilast (ZORYVE) 0.3 % CREA Apply 1 Application topically daily. Apply to aa of face and any other areas of body for rash  rosuvastatin (CRESTOR) 20 MG tablet Take 1 tablet (20 mg total) by mouth daily.   Tapinarof (VTAMA) 1 % CREA Apply to affected areas psoriasis qd/bid prn.   triamcinolone cream (KENALOG) 0.1 % Apply 1 application topically as directed. Qd to bid aa itching on back until clear, avoid face, groin, axilla       09/21/2022   10:48 AM 04/19/2022    9:38 AM 10/14/2021   10:15 AM 04/15/2021    9:20 AM  GAD 7 : Generalized Anxiety Score  Nervous, Anxious, on Edge 0 0 0 0  Control/stop worrying 0 0 0 0  Worry too much - different things 0 0 0 0  Trouble relaxing 0 0 0 0  Restless 0 0 0 0  Easily annoyed or irritable 0 0 0 0  Afraid - awful might happen 0 0 0 0  Total GAD 7 Score 0 0 0 0  Anxiety Difficulty Not difficult at  all Not difficult at all Not difficult at all        09/21/2022   10:47 AM 09/09/2022    9:43 AM 04/19/2022    9:37 AM  Depression screen PHQ 2/9  Decreased Interest 0 0 0  Down, Depressed, Hopeless 0 0 0  PHQ - 2 Score 0 0 0  Altered sleeping 0 0 0  Tired, decreased energy 0 0 0  Change in appetite 0 0 0  Feeling bad or failure about yourself  0 0 0  Trouble concentrating 0 0 0  Moving slowly or fidgety/restless 0 0 0  Suicidal thoughts 0 0 0  PHQ-9 Score 0 0 0  Difficult doing work/chores Not difficult at all Not difficult at all Not difficult at all    BP Readings from Last 3 Encounters:  09/21/22 120/78  08/26/22 111/71  07/14/22 139/76    Physical Exam Vitals and nursing note reviewed. Exam conducted with a chaperone present.  Constitutional:      General: She is not in acute distress.    Appearance: She is not diaphoretic.  HENT:     Head: Normocephalic and atraumatic.     Right Ear: Tympanic membrane, ear canal and external ear normal.     Left Ear: Tympanic membrane, ear canal and external ear normal.     Nose: Nose normal. No congestion or rhinorrhea.     Mouth/Throat:     Mouth: Mucous membranes are moist.     Pharynx: No oropharyngeal exudate or posterior oropharyngeal erythema.  Eyes:     General:        Right eye: No discharge.        Left eye: No discharge.     Conjunctiva/sclera: Conjunctivae normal.     Pupils: Pupils are equal, round, and reactive to light.  Neck:     Thyroid: No thyromegaly.     Vascular: No JVD.  Cardiovascular:     Rate and Rhythm: Normal rate and regular rhythm.     Heart sounds: Normal heart sounds. No murmur heard.    No friction rub. No gallop.  Pulmonary:     Effort: Pulmonary effort is normal.     Breath sounds: Normal breath sounds. No wheezing, rhonchi or rales.  Abdominal:     General: Bowel sounds are normal.     Palpations: Abdomen is soft. There is no mass.     Tenderness: There is no abdominal tenderness.  There is no right CVA tenderness, left CVA tenderness or guarding.  Musculoskeletal:  General: Normal range of motion.     Cervical back: Normal range of motion and neck supple.  Lymphadenopathy:     Cervical: No cervical adenopathy.  Skin:    General: Skin is warm and dry.  Neurological:     General: No focal deficit present.     Mental Status: She is alert.     Cranial Nerves: No cranial nerve deficit.     Sensory: No sensory deficit.     Motor: No weakness.     Deep Tendon Reflexes: Reflexes are normal and symmetric. Reflexes normal.     Wt Readings from Last 3 Encounters:  09/21/22 219 lb (99.3 kg)  09/09/22 222 lb (100.7 kg)  04/19/22 222 lb (100.7 kg)    BP 120/78   Pulse 68   Ht 5\' 6"  (1.676 m)   Wt 219 lb (99.3 kg)   SpO2 98%   BMI 35.35 kg/m   Assessment and Plan: 1. Essential (primary) hypertension Chronic.  Controlled.  Stable.  Blood pressure 120/78.  Continue lisinopril hydrochlorothiazide 10-12.5 mg once a day.  Asymptomatic.  Tolerating medications well.  Will check renal function panel for electrolytes and GFR.  Will recheck patient in 6 months. - lisinopril-hydrochlorothiazide (ZESTORETIC) 10-12.5 MG tablet; Take 1 tablet by mouth daily.  Dispense: 90 tablet; Refill: 1 - Renal Function Panel  2. Pure hypercholesterolemia Chronic.  Controlled.  Stable.  Continue rosuvastatin 20 mg once a day.  Review of previous lipid panel is acceptable and will recheck in 6 months. - rosuvastatin (CRESTOR) 20 MG tablet; Take 1 tablet (20 mg total) by mouth daily.  Dispense: 90 tablet; Refill: 1  3. Labyrinthitis, unspecified laterality Chronic.  Uncontrolled.  Gradually worsening over the course of several months.  Patient has more frequent episodes as well as they seem to be more intense.  Patient is unable to divert them with doing self Epley maneuvers and chiropractic evaluations.  Will continue meclizine 25 mg 3 times a day and refer to ear nose and throat for  further evaluation. - meclizine (ANTIVERT) 25 MG tablet; Take 1 tablet (25 mg total) by mouth 3 (three) times daily as needed for dizziness.  Dispense: 90 tablet; Refill: 1 - Ambulatory referral to ENT  4. Prediabetes Patient's had normal glucoses noted but because of her gastroparesis we will check her A1c her suggestion of her gastroenterologist presumably to evaluate for prediabetic/diabetic concerns. - HgB A1c  5. Erythema intertrigo Patient requested refill of her nystatin powder which she applies to 3 times a day as needed due to the candidiasis infection of the pannus fold. - nystatin (MYCOSTATIN/NYSTOP) powder; Apply 2 to 3 times daily as needed for rash at lower abdominal folds  Dispense: 60 g; Refill: 3     Otilio Miu, MD

## 2022-09-22 LAB — HEMOGLOBIN A1C
Est. average glucose Bld gHb Est-mCnc: 120 mg/dL
Hgb A1c MFr Bld: 5.8 % — ABNORMAL HIGH (ref 4.8–5.6)

## 2022-09-22 LAB — RENAL FUNCTION PANEL
Albumin: 4.5 g/dL (ref 3.9–4.9)
BUN/Creatinine Ratio: 11 — ABNORMAL LOW (ref 12–28)
BUN: 9 mg/dL (ref 8–27)
CO2: 21 mmol/L (ref 20–29)
Calcium: 10 mg/dL (ref 8.7–10.3)
Chloride: 101 mmol/L (ref 96–106)
Creatinine, Ser: 0.85 mg/dL (ref 0.57–1.00)
Glucose: 95 mg/dL (ref 70–99)
Phosphorus: 3.8 mg/dL (ref 3.0–4.3)
Potassium: 4 mmol/L (ref 3.5–5.2)
Sodium: 140 mmol/L (ref 134–144)
eGFR: 75 mL/min/{1.73_m2} (ref >59)

## 2022-09-28 DIAGNOSIS — H353131 Nonexudative age-related macular degeneration, bilateral, early dry stage: Secondary | ICD-10-CM | POA: Diagnosis not present

## 2022-09-28 DIAGNOSIS — H35373 Puckering of macula, bilateral: Secondary | ICD-10-CM | POA: Diagnosis not present

## 2022-09-28 DIAGNOSIS — H43812 Vitreous degeneration, left eye: Secondary | ICD-10-CM | POA: Diagnosis not present

## 2022-09-28 DIAGNOSIS — Z961 Presence of intraocular lens: Secondary | ICD-10-CM | POA: Diagnosis not present

## 2022-10-06 DIAGNOSIS — M9903 Segmental and somatic dysfunction of lumbar region: Secondary | ICD-10-CM | POA: Diagnosis not present

## 2022-10-06 DIAGNOSIS — M62830 Muscle spasm of back: Secondary | ICD-10-CM | POA: Diagnosis not present

## 2022-10-06 DIAGNOSIS — M9905 Segmental and somatic dysfunction of pelvic region: Secondary | ICD-10-CM | POA: Diagnosis not present

## 2022-10-06 DIAGNOSIS — M5136 Other intervertebral disc degeneration, lumbar region: Secondary | ICD-10-CM | POA: Diagnosis not present

## 2022-10-19 ENCOUNTER — Ambulatory Visit: Payer: Medicare Other

## 2022-10-19 ENCOUNTER — Ambulatory Visit: Payer: Medicare Other | Admitting: Family Medicine

## 2022-10-19 DIAGNOSIS — R110 Nausea: Secondary | ICD-10-CM | POA: Diagnosis not present

## 2022-10-19 DIAGNOSIS — K2950 Unspecified chronic gastritis without bleeding: Secondary | ICD-10-CM | POA: Diagnosis not present

## 2022-10-19 DIAGNOSIS — K219 Gastro-esophageal reflux disease without esophagitis: Secondary | ICD-10-CM | POA: Diagnosis not present

## 2022-10-19 DIAGNOSIS — D122 Benign neoplasm of ascending colon: Secondary | ICD-10-CM | POA: Diagnosis not present

## 2022-10-19 DIAGNOSIS — K640 First degree hemorrhoids: Secondary | ICD-10-CM | POA: Diagnosis not present

## 2022-10-19 DIAGNOSIS — K449 Diaphragmatic hernia without obstruction or gangrene: Secondary | ICD-10-CM | POA: Diagnosis not present

## 2022-10-19 DIAGNOSIS — K635 Polyp of colon: Secondary | ICD-10-CM | POA: Diagnosis not present

## 2022-10-19 DIAGNOSIS — R0789 Other chest pain: Secondary | ICD-10-CM | POA: Diagnosis not present

## 2022-10-19 DIAGNOSIS — K5150 Left sided colitis without complications: Secondary | ICD-10-CM | POA: Diagnosis not present

## 2022-10-19 DIAGNOSIS — R1013 Epigastric pain: Secondary | ICD-10-CM | POA: Diagnosis not present

## 2022-10-19 DIAGNOSIS — K2970 Gastritis, unspecified, without bleeding: Secondary | ICD-10-CM | POA: Diagnosis not present

## 2022-10-19 DIAGNOSIS — R6881 Early satiety: Secondary | ICD-10-CM | POA: Diagnosis not present

## 2022-10-19 DIAGNOSIS — K5730 Diverticulosis of large intestine without perforation or abscess without bleeding: Secondary | ICD-10-CM | POA: Diagnosis not present

## 2022-10-19 LAB — HM COLONOSCOPY

## 2022-10-27 DIAGNOSIS — M62830 Muscle spasm of back: Secondary | ICD-10-CM | POA: Diagnosis not present

## 2022-10-27 DIAGNOSIS — M5136 Other intervertebral disc degeneration, lumbar region: Secondary | ICD-10-CM | POA: Diagnosis not present

## 2022-10-27 DIAGNOSIS — M9905 Segmental and somatic dysfunction of pelvic region: Secondary | ICD-10-CM | POA: Diagnosis not present

## 2022-10-27 DIAGNOSIS — M9903 Segmental and somatic dysfunction of lumbar region: Secondary | ICD-10-CM | POA: Diagnosis not present

## 2022-11-02 DIAGNOSIS — R42 Dizziness and giddiness: Secondary | ICD-10-CM | POA: Diagnosis not present

## 2022-11-02 DIAGNOSIS — G43E09 Chronic migraine with aura, not intractable, without status migrainosus: Secondary | ICD-10-CM | POA: Diagnosis not present

## 2022-11-02 DIAGNOSIS — H903 Sensorineural hearing loss, bilateral: Secondary | ICD-10-CM | POA: Diagnosis not present

## 2022-11-16 DIAGNOSIS — R42 Dizziness and giddiness: Secondary | ICD-10-CM | POA: Diagnosis not present

## 2022-11-23 DIAGNOSIS — M62830 Muscle spasm of back: Secondary | ICD-10-CM | POA: Diagnosis not present

## 2022-11-23 DIAGNOSIS — M5136 Other intervertebral disc degeneration, lumbar region: Secondary | ICD-10-CM | POA: Diagnosis not present

## 2022-11-23 DIAGNOSIS — M9903 Segmental and somatic dysfunction of lumbar region: Secondary | ICD-10-CM | POA: Diagnosis not present

## 2022-11-23 DIAGNOSIS — M9905 Segmental and somatic dysfunction of pelvic region: Secondary | ICD-10-CM | POA: Diagnosis not present

## 2022-12-07 ENCOUNTER — Other Ambulatory Visit: Payer: Self-pay | Admitting: Family Medicine

## 2022-12-07 DIAGNOSIS — Z1231 Encounter for screening mammogram for malignant neoplasm of breast: Secondary | ICD-10-CM

## 2022-12-08 ENCOUNTER — Ambulatory Visit
Admission: RE | Admit: 2022-12-08 | Discharge: 2022-12-08 | Disposition: A | Discharge status: Home / Self Care | Payer: Medicare Other | Source: Ambulatory Visit | Attending: Family Medicine | Admitting: Family Medicine

## 2022-12-08 DIAGNOSIS — I10 Essential (primary) hypertension: Secondary | ICD-10-CM | POA: Diagnosis not present

## 2022-12-08 DIAGNOSIS — Z1231 Encounter for screening mammogram for malignant neoplasm of breast: Secondary | ICD-10-CM | POA: Diagnosis not present

## 2022-12-08 DIAGNOSIS — E782 Mixed hyperlipidemia: Secondary | ICD-10-CM | POA: Diagnosis not present

## 2022-12-08 DIAGNOSIS — I493 Ventricular premature depolarization: Secondary | ICD-10-CM | POA: Diagnosis not present

## 2022-12-08 DIAGNOSIS — R0609 Other forms of dyspnea: Secondary | ICD-10-CM | POA: Diagnosis not present

## 2022-12-15 DIAGNOSIS — H539 Unspecified visual disturbance: Secondary | ICD-10-CM | POA: Diagnosis not present

## 2022-12-15 DIAGNOSIS — R42 Dizziness and giddiness: Secondary | ICD-10-CM | POA: Diagnosis not present

## 2022-12-15 DIAGNOSIS — R202 Paresthesia of skin: Secondary | ICD-10-CM | POA: Diagnosis not present

## 2022-12-15 DIAGNOSIS — R200 Anesthesia of skin: Secondary | ICD-10-CM | POA: Diagnosis not present

## 2022-12-15 DIAGNOSIS — R519 Headache, unspecified: Secondary | ICD-10-CM | POA: Diagnosis not present

## 2022-12-20 DIAGNOSIS — M9905 Segmental and somatic dysfunction of pelvic region: Secondary | ICD-10-CM | POA: Diagnosis not present

## 2022-12-20 DIAGNOSIS — M9903 Segmental and somatic dysfunction of lumbar region: Secondary | ICD-10-CM | POA: Diagnosis not present

## 2022-12-20 DIAGNOSIS — M62830 Muscle spasm of back: Secondary | ICD-10-CM | POA: Diagnosis not present

## 2022-12-20 DIAGNOSIS — M5136 Other intervertebral disc degeneration, lumbar region: Secondary | ICD-10-CM | POA: Diagnosis not present

## 2022-12-28 ENCOUNTER — Ambulatory Visit (INDEPENDENT_AMBULATORY_CARE_PROVIDER_SITE_OTHER): Payer: Medicare Other | Admitting: Dermatology

## 2022-12-28 VITALS — BP 154/85

## 2022-12-28 DIAGNOSIS — L659 Nonscarring hair loss, unspecified: Secondary | ICD-10-CM | POA: Diagnosis not present

## 2022-12-28 DIAGNOSIS — Z1283 Encounter for screening for malignant neoplasm of skin: Secondary | ICD-10-CM | POA: Diagnosis not present

## 2022-12-28 DIAGNOSIS — L820 Inflamed seborrheic keratosis: Secondary | ICD-10-CM | POA: Diagnosis not present

## 2022-12-28 DIAGNOSIS — B078 Other viral warts: Secondary | ICD-10-CM

## 2022-12-28 DIAGNOSIS — L409 Psoriasis, unspecified: Secondary | ICD-10-CM | POA: Diagnosis not present

## 2022-12-28 DIAGNOSIS — W57XXXA Bitten or stung by nonvenomous insect and other nonvenomous arthropods, initial encounter: Secondary | ICD-10-CM

## 2022-12-28 DIAGNOSIS — Z86018 Personal history of other benign neoplasm: Secondary | ICD-10-CM | POA: Diagnosis not present

## 2022-12-28 DIAGNOSIS — S30860A Insect bite (nonvenomous) of lower back and pelvis, initial encounter: Secondary | ICD-10-CM | POA: Diagnosis not present

## 2022-12-28 DIAGNOSIS — Z79899 Other long term (current) drug therapy: Secondary | ICD-10-CM

## 2022-12-28 MED ORDER — OTEZLA 30 MG PO TABS
ORAL_TABLET | ORAL | 1 refills | Status: DC
Start: 2022-12-28 — End: 2023-06-14

## 2022-12-28 MED ORDER — BIMATOPROST 0.03 % EX SOLN
5.0000 mL | Freq: Every day | CUTANEOUS | 12 refills | Status: DC
Start: 2022-12-28 — End: 2023-12-13

## 2022-12-28 NOTE — Progress Notes (Signed)
Follow-Up Visit   Subjective  Theresa Gross is a 67 y.o. female who presents for the following: Psoriasis of the scalp, face, elbows. Improved with Otezla 30 MG twice daily, tolerating ok. She uses Taclonex solution for flares at the posterior hairline. Vtama samples from last visit helped on eyelids, but wasn't covered by insurance.  The patient presents for Upper Body Skin Exam (UBSE) for skin cancer screening and mole check.  The patient has spots, moles and lesions to be evaluated, some may be new or changing and the patient has concerns that these could be cancer.  Patient also has a couple of irritating spots to check on the chest and lower abdomen.   The following portions of the chart were reviewed this encounter and updated as appropriate: medications, allergies, medical history  Review of Systems:  No other skin or systemic complaints except as noted in HPI or Assessment and Plan.  Objective  Well appearing patient in no apparent distress; mood and affect are within normal limits.  Areas Examined: Face, scalp, trunk, extremities  Relevant exam findings are noted in the Assessment and Plan.    upper occipital scalp x 1, R upper breast x 1, inf umbilicus x 1 (3) Erythematous stuck-on, waxy papule or plaque  R index DIP 3.0 mm Verrucous papule -- Discussed viral etiology and contagion.   eyelashes Thinning of the eyelashes.  Left Lower Back Pink papule    Assessment & Plan   Inflamed seborrheic keratosis (3) upper occipital scalp x 1, R upper breast x 1, inf umbilicus x 1  Symptomatic, irritating, patient would like treated.  Destruction of lesion - upper occipital scalp x 1, R upper breast x 1, inf umbilicus x 1  Destruction method: cryotherapy   Informed consent: discussed and consent obtained   Lesion destroyed using liquid nitrogen: Yes   Region frozen until ice ball extended beyond lesion: Yes   Outcome: patient tolerated procedure well with no  complications   Post-procedure details: wound care instructions given   Additional details:  Prior to procedure, discussed risks of blister formation, small wound, skin dyspigmentation, or rare scar following cryotherapy. Recommend Vaseline ointment to treated areas while healing.   Other viral warts R index DIP  Viral Wart (HPV) Counseling  Discussed viral / HPV (Human Papilloma Virus) etiology and risk of spread /infectivity to other areas of body as well as to other people.  Multiple treatments and methods may be required to clear warts and it is possible treatment may not be successful.  Treatment risks include discoloration; scarring and there is still potential for wart recurrence.  Destruction of lesion - R index DIP  Destruction method: cryotherapy   Informed consent: discussed and consent obtained   Lesion destroyed using liquid nitrogen: Yes   Region frozen until ice ball extended beyond lesion: Yes   Outcome: patient tolerated procedure well with no complications   Post-procedure details: wound care instructions given   Additional details:  Prior to procedure, discussed risks of blister formation, small wound, skin dyspigmentation, or rare scar following cryotherapy. Recommend Vaseline ointment to treated areas while healing.   Hypotrichosis eyelashes  Restart Latisse at bedtime along lash line as directed dsp 5mL 1 yr Rf. Risk of darkening of the skin.  bimatoprost (LATISSE) 0.03 % ophthalmic solution - eyelashes Place 5 mLs into both eyes at bedtime. Place one drop on applicator and apply evenly along the skin of the upper eyelid at base of eyelashes once daily at  bedtime; repeat procedure for second eye (use a clean applicator).  Bug bite without infection, initial encounter Left Lower Back  Start Taclonex solution BID until improved.   Psoriasis  Related Medications ketoconazole (NIZORAL) 2 % cream Apply to affected areas psoriasis on face once to twice  daily.  calcipotriene-betamethasone (TACLONEX) external suspension Apply topically daily.  Apremilast (OTEZLA) 30 MG TABS TAKE ONE TABLET BY MOUTH TWICE A DAY - ( DO NOT CRUSH, CHEW, OR SPLIT TABLETS)  History of Dysplastic Nevus R back, 2009 - No evidence of recurrence today - Recommend regular full body skin exams - Recommend daily broad spectrum sunscreen SPF 30+ to sun-exposed areas, reapply every 2 hours as needed.  - Call if any new or changing lesions are noted between office visits   PSORIASIS Erythema of the occipital scalp, <1% BSA.  Chronic condition with duration or expected duration over one year. Currently well-controlled.   Patient denies joint pain  Treatment Plan: Continue Otezla 30 MG 1 po BID Side effects of Otezla (apremilast) include diarrhea, nausea, headache, upper respiratory infection, depression, and weight decrease (5-10%). It should only be taken by pregnant women after a discussion regarding risks and benefits with their doctor. Goal is control of skin condition, not cure.  The use of [place] requires long term medication management, including periodic office visits.  Continue Taclonex solution at bedtime to scalp prn. Pt will call for refills.  Samples x 2 of Vtama Cream given to patient. Apply every day as needed to eyelids.  Lot [id number] Exp 06/2023.  Counseling on psoriasis and coordination of care  psoriasis is a chronic non-curable, but treatable genetic/hereditary disease that may have other systemic features affecting other organ systems such as joints (Psoriatic Arthritis). It is associated with an increased risk of inflammatory bowel disease, heart disease, non-alcoholic fatty liver disease, and depression.  Treatments include light and laser treatments; topical medications; and systemic medications including oral and injectables.    Return in about 6 months (around 06/29/2023) for Psoriasis.  ICherlyn Labella, CMA, am acting as scribe for Willeen Niece, MD .   Documentation: I have reviewed the above documentation for accuracy and completeness, and I agree with the above.  Willeen Niece, MD

## 2022-12-28 NOTE — Patient Instructions (Signed)
Due to recent changes in healthcare laws, you may see results of your pathology and/or laboratory studies on MyChart before the doctors have had a chance to review them. We understand that in some cases there may be results that are confusing or concerning to you. Please understand that not all results are received at the same time and often the doctors may need to interpret multiple results in order to provide you with the best plan of care or course of treatment. Therefore, we ask that you please give us 2 business days to thoroughly review all your results before contacting the office for clarification. Should we see a critical lab result, you will be contacted sooner.   If You Need Anything After Your Visit  If you have any questions or concerns for your doctor, please call our main line at [phone] and press option 4 to reach your doctor's medical assistant. If no one answers, please leave a voicemail as directed and we will return your call as soon as possible. Messages left after 4 pm will be answered the following business day.   You may also send us a message via MyChart. We typically respond to MyChart messages within 1-2 business days.  For prescription refills, please ask your pharmacy to contact our office. Our fax number is [phone].  If you have an urgent issue when the clinic is closed that cannot wait until the next business day, you can page your doctor at the number below.    Please note that while we do our best to be available for urgent issues outside of office hours, we are not available 24/7.   If you have an urgent issue and are unable to reach us, you may choose to seek medical care at your doctor's office, retail clinic, urgent care center, or emergency room.  If you have a medical emergency, please immediately call 911 or go to the emergency department.  Pager Numbers  - Dr. Kowalski: [phone]  - Dr. Moye: [phone]  - Dr. Stewart:  [phone]  In the event of inclement weather, please call our main line at [phone] for an update on the status of any delays or closures.  Dermatology Medication Tips: Please keep the boxes that topical medications come in in order to help keep track of the instructions about where and how to use these. Pharmacies typically print the medication instructions only on the boxes and not directly on the medication tubes.   If your medication is too expensive, please contact our office at [phone] option 4 or send us a message through MyChart.   We are unable to tell what your co-pay for medications will be in advance as this is different depending on your insurance coverage. However, we may be able to find a substitute medication at lower cost or fill out paperwork to get insurance to cover a needed medication.   If a prior authorization is required to get your medication covered by your insurance company, please allow us 1-2 business days to complete this process.  Drug prices often vary depending on where the prescription is filled and some pharmacies may offer cheaper prices.  The website www.goodrx.com contains coupons for medications through different pharmacies. The prices here do not account for what the cost may be with help from insurance (it may be cheaper with your insurance), but the website can give you the price if you did not use any insurance.  - You can print the associated coupon and take it with   your prescription to the pharmacy.  - You may also stop by our office during regular business hours and pick up a GoodRx coupon card.  - If you need your prescription sent electronically to a different pharmacy, notify our office through [place] MyChart or by phone at [phone] option 4.     Si Usted Necesita Algo Despus de Su Visita  Tambin puede enviarnos un mensaje a travs de MyChart. Por lo general respondemos a los mensajes de MyChart en el transcurso de 1 a 2  das hbiles.  Para renovar recetas, por favor pida a su farmacia que se ponga en contacto con nuestra oficina. Nuestro nmero de fax es el [phone].  Si tiene un asunto urgente cuando la clnica est cerrada y que no puede esperar hasta el siguiente da hbil, puede llamar/localizar a su doctor(a) al nmero que aparece a continuacin.   Por favor, tenga en cuenta que aunque hacemos todo lo posible para estar disponibles para asuntos urgentes fuera del horario de oficina, no estamos disponibles las 24 horas del da, los 7 das de la semana.   Si tiene un problema urgente y no puede comunicarse con nosotros, puede optar por buscar atencin mdica  en el consultorio de su doctor(a), en una clnica privada, en un centro de atencin urgente o en una sala de emergencias.  Si tiene una emergencia mdica, por favor llame inmediatamente al 911 o vaya a la sala de emergencias.  Nmeros de bper  - Dr. Kowalski: [phone]  - Dra. Moye: [phone]  - Dra. Stewart: [phone]  En caso de inclemencias del tiempo, por favor llame a nuestra lnea principal al [phone] para una actualizacin sobre el estado de cualquier retraso o cierre.  Consejos para la medicacin en dermatologa: Por favor, guarde las cajas en las que vienen los medicamentos de uso tpico para ayudarle a seguir las instrucciones sobre dnde y cmo usarlos. Las farmacias generalmente imprimen las instrucciones del medicamento slo en las cajas y no directamente en los tubos del medicamento.   Si su medicamento es muy caro, por favor, pngase en contacto con nuestra oficina llamando al [phone] y presione la opcin 4 o envenos un mensaje a travs de MyChart.   No podemos decirle cul ser su copago por los medicamentos por adelantado ya que esto es diferente dependiendo de la cobertura de su seguro. Sin embargo, es posible que podamos encontrar un medicamento sustituto a menor costo o llenar un formulario para que el  seguro cubra el medicamento que se considera necesario.   Si se requiere una autorizacin previa para que su compaa de seguros cubra su medicamento, por favor permtanos de 1 a 2 das hbiles para completar este proceso.  Los precios de los medicamentos varan con frecuencia dependiendo del lugar de dnde se surte la receta y alguna farmacias pueden ofrecer precios ms baratos.  El sitio web www.goodrx.com tiene cupones para medicamentos de diferentes farmacias. Los precios aqu no tienen en cuenta lo que podra costar con la ayuda del seguro (puede ser ms barato con su seguro), pero el sitio web puede darle el precio si no utiliz ningn seguro.  - Puede imprimir el cupn correspondiente y llevarlo con su receta a la farmacia.  - Tambin puede pasar por nuestra oficina durante el horario de atencin regular y recoger una tarjeta de cupones de GoodRx.  - Si necesita que su receta se enve electrnicamente a una farmacia diferente, informe a nuestra oficina a travs de MyChart de [place]   o por telfono llamando al [phone] y presione la opcin 4.  

## 2023-01-13 ENCOUNTER — Ambulatory Visit
Admission: EM | Admit: 2023-01-13 | Discharge: 2023-01-13 | Disposition: A | Discharge status: Home / Self Care | Payer: Medicare Other | Attending: Physician Assistant | Admitting: Physician Assistant

## 2023-01-13 ENCOUNTER — Other Ambulatory Visit: Payer: Self-pay

## 2023-01-13 DIAGNOSIS — R42 Dizziness and giddiness: Secondary | ICD-10-CM | POA: Diagnosis not present

## 2023-01-13 DIAGNOSIS — R0789 Other chest pain: Secondary | ICD-10-CM | POA: Diagnosis not present

## 2023-01-13 LAB — GLUCOSE, CAPILLARY: Glucose-Capillary: 101 mg/dL — ABNORMAL HIGH (ref 70–99)

## 2023-01-13 NOTE — Discharge Instructions (Addendum)
-  Your vital signs look great, blood sugar is not too high or low, and your EKG is with out any significant abnormality. - It is a great sign that you are feeling better and your symptoms have improved. - Go home and take your meclizine which should help if this is related to positional vertigo. We also discussed it being possibly migraine related so continue nortriptyline and BC as needed. - Rest and increase your fluid intake. - If it happens again, you can either distract yourself or lay down and focus on your breathing.  Consider getting a small bite of food and hydrating. -Continue to follow up with specialist. - If it is ever accompanied by severe headache, feeling faint or passing out, chest pain, shortness of breath, feel like your heart is racing or fluttering, nausea/vomiting, weakness, etc. you need to go to emergency department.

## 2023-01-13 NOTE — ED Triage Notes (Addendum)
Pt states 1 hour PTA she started experiencing slight chest discomfort with some tightness and dizziness. Also states she has some euphoric feeling that she can't describe. States pain does not radiate and she Denies N/V and pain.

## 2023-01-13 NOTE — ED Provider Notes (Signed)
[facility]    CSN: [id number] Arrival date & time: 01/13/23  1508      History   Chief Complaint Chief Complaint  Patient presents with   Chest Pain   Dizziness    HPI Theresa Gross is a 67 y.o. female with history of recurrent dizzy spells (following up with neurology), headaches, hypertension, PVCs, exertional dyspnea, hyperlipidemia, prediabetes, positional vertigo, chronic nausea, GERD, obesity, atypical chest pain (sees cardiology), and colitis.  Patient presents today for dizzy and "euphoric" with chest tightness for the past hour.  Patient says symptoms have improved from onset.  She reports she was just sitting down playing games with friends when symptoms started.  She says "I have chest tightness all the time."  She says it does not really chest pain and she denies any palpitations, shortness of breath, pain on breathing.  Patient reports she felt like she was getting a little bit of a headache so she took a BC.  She does have a history of headaches.  Patient had a consult with neurology 1 month ago on 12/15/2022 for 7-9 month history of episodes of dizziness, headaches, numbness and tingling in the left arm and visual aura.  Previous to that she followed up with cardiology on 12/08/2022 for PVCs, hypertension, exertional dyspnea and hyperlipidemia.  Had normal coronary CT 5 months ago.  Patient also reports seeing ENT specialist and being told there was nothing wrong with her ENT.  Of note, patient was seen here 2 years ago for the exact same complaints.  Had normal EKG at that time.  She was reassured by the normal EKG and vital signs.  She took meclizine and symptoms improved.  She says that her symptoms that she is experiencing now feels the same.  She reports she has these dizziness episodes once every month or 2.   HPI  Past Medical History:  Diagnosis Date   Cervical cancer [facility])    age 64   Diverticulosis    Dysplastic nevus 11/29/2007   Right back.  Severe atypia, extends to margin. Excised: 12/22/2007, margins free.   GERD (gastroesophageal reflux disease)    Hemorrhoids    History of colon polyps    Hyperlipemia    Hypertension    Left sided colitis (HCC)    Psoriasis    Taking Otezla   Vitamin D deficiency     Patient Active Problem List   Diagnosis Date Noted   History of total abdominal hysterectomy 10/14/2021   Varicose veins of leg with pain, left 11/04/2020   Varicose veins of leg with pain, right 10/07/2020   Osteopenia 08/07/2020   Class 2 obesity due to excess calories without serious comorbidity with body mass index (BMI) of 37.0 to 37.9 in adult 02/16/2017   Pure hypercholesterolemia 02/16/2017   Aortic atherosclerosis (HCC) 05/25/2016   Steatosis of liver 05/25/2016   Mixed hyperlipidemia 05/25/2016   Essential (primary) hypertension 10/28/2014   Gastro-esophageal reflux disease without esophagitis 10/28/2014   H/O psoriasis 10/28/2014   Decreased potassium in the blood 10/28/2014   Familial multiple lipoprotein-type hyperlipidemia 10/28/2014    Past Surgical History:  Procedure Laterality Date   ABDOMINAL HYSTERECTOMY     BREAST BIOPSY Right 10 plus yrs ago   stereo bx./clip. Benign   CHOLECYSTECTOMY     COLONOSCOPY WITH PROPOFOL N/A 06/16/2016   Procedure: COLONOSCOPY WITH PROPOFOL;  Surgeon: Scot Jun, MD;  Location: [facility] ENDOSCOPY;  Service: Endoscopy;  Laterality: N/A;   COLONOSCOPY WITH PROPOFOL  N/A 12/17/2019   Procedure: COLONOSCOPY WITH PROPOFOL;  Surgeon: Toledo, Boykin Nearing, MD;  Location: [facility] ENDOSCOPY;  Service: Gastroenterology;  Laterality: N/A;   DIAGNOSTIC LAPAROSCOPY     ESOPHAGOGASTRODUODENOSCOPY (EGD) WITH PROPOFOL N/A 12/17/2019   Procedure: ESOPHAGOGASTRODUODENOSCOPY (EGD) WITH PROPOFOL;  Surgeon: Toledo, Boykin Nearing, MD;  Location: [facility] ENDOSCOPY;  Service: Gastroenterology;  Laterality: N/A;   EYE SURGERY     GALLBLADDER SURGERY  06/04/2014   TOTAL ABDOMINAL HYSTERECTOMY      UPPER GASTROINTESTINAL ENDOSCOPY      OB History     Gravida  1   Para  1   Term  1   Preterm      AB      Living  1      SAB      IAB      Ectopic      Multiple      Live Births  1            Home Medications    Prior to Admission medications   Medication Sig Start Date End Date Taking? Authorizing Provider  Apremilast (OTEZLA) 30 MG TABS TAKE ONE TABLET BY MOUTH TWICE A DAY - ( DO NOT CRUSH, CHEW, OR SPLIT TABLETS) 12/28/22  Yes Willeen Niece, MD  bimatoprost (LATISSE) 0.03 % ophthalmic solution Place 5 mLs into both eyes at bedtime. Place one drop on applicator and apply evenly along the skin of the upper eyelid at base of eyelashes once daily at bedtime; repeat procedure for second eye (use a clean applicator). 12/28/22  Yes Willeen Niece, MD  calcipotriene-betamethasone [facility]) external suspension Apply topically daily. 02/22/22  Yes Willeen Niece, MD  Calcium-Phosphorus-Vitamin D (CITRACAL +D3 PO) Take 2 tablets by mouth daily.   Yes [provider]  Cholecalciferol (VITAMIN D) 2000 units tablet Take 2,000 Units by mouth daily.   Yes [provider]  co-enzyme Q-10 30 MG capsule Take 30 mg by mouth daily.   Yes [provider]  ketoconazole (NIZORAL) 2 % cream Apply to affected areas psoriasis on face once to twice daily. 08/18/21  Yes Willeen Niece, MD  lisinopril-hydrochlorothiazide (ZESTORETIC) 10-12.5 MG tablet Take 1 tablet by mouth daily. 09/21/22  Yes Duanne Limerick, MD  meclizine (ANTIVERT) 25 MG tablet Take 1 tablet (25 mg total) by mouth 3 (three) times daily as needed for dizziness. 09/21/22  Yes Duanne Limerick, MD  metoCLOPramide (REGLAN) 5 MG tablet Take 5 mg by mouth at bedtime as needed. GI 09/10/22  Yes [provider]  Multiple Vitamins-Minerals (MULTIVITAMIN WOMEN) TABS Take by mouth.   Yes [provider]  nystatin (MYCOSTATIN/NYSTOP) powder Apply 1 Application topically 3 (three) times daily. 09/21/22   Yes Duanne Limerick, MD  nystatin (MYCOSTATIN/NYSTOP) powder Apply 2 to 3 times daily as needed for rash at lower abdominal folds 09/21/22  Yes Duanne Limerick, MD  nystatin-triamcinolone (MYCOLOG II) cream Apply 1 application topically 2 (two) times daily. PRN/ Derm 10/24/19  Yes Duanne Limerick, MD  rosuvastatin (CRESTOR) 20 MG tablet Take 1 tablet (20 mg total) by mouth daily. 09/21/22  Yes Duanne Limerick, MD  triamcinolone cream (KENALOG) 0.1 % Apply 1 application topically as directed. Qd to bid aa itching on back until clear, avoid face, groin, axilla 08/18/21  Yes Willeen Niece, MD  Dexlansoprazole 30 MG capsule DR Take 30 mg by mouth daily. GI 09/10/22 09/10/23  [provider]    Family History Family History  Problem Relation Age  of Onset   Colon cancer Mother    Diabetes Father    Breast cancer Neg Hx     Social History Social History   Tobacco Use   Smoking status: Former    Types: Cigarettes   Smokeless tobacco: Never  Vaping Use   Vaping status: Never Used  Substance Use Topics   Alcohol use: Yes    Alcohol/week: 1.0 standard drink of alcohol    Types: 1 Cans of beer per week   Drug use: No     Allergies   Ciprofloxacin, Lipitor [atorvastatin], Metronidazole, and Nsaids   Review of Systems Review of Systems  Constitutional:  Negative for fatigue.  Eyes:  Negative for visual disturbance.  Respiratory:  Positive for chest tightness. Negative for shortness of breath.   Cardiovascular:  Negative for chest pain and palpitations.  Gastrointestinal:  Negative for abdominal pain, nausea and vomiting.  Musculoskeletal:  Negative for arthralgias and myalgias.  Neurological:  Positive for dizziness, tremors and headaches (not currently). Negative for seizures, syncope, facial asymmetry, speech difficulty, weakness, light-headedness and numbness.  Psychiatric/Behavioral:  The patient is not nervous/anxious.      Physical Exam Triage Vital Signs ED Triage  Vitals  Encounter Vitals Group     BP      Systolic BP Percentile      Diastolic BP Percentile      Pulse      Resp      Temp      Temp src      SpO2      Weight      Height      Head Circumference      Peak Flow      Pain Score      Pain Loc      Pain Education      Exclude from Growth Chart    No data found.  Updated Vital Signs BP 136/87   Pulse 100   Resp 20   SpO2 97%   Physical Exam Vitals and nursing note reviewed.  Constitutional:      General: She is not in acute distress.    Appearance: Normal appearance. She is not ill-appearing or toxic-appearing.  HENT:     Head: Normocephalic and atraumatic.     Nose: Nose normal.     Mouth/Throat:     Mouth: Mucous membranes are moist.     Pharynx: Oropharynx is clear.  Eyes:     General: No scleral icterus.       Right eye: No discharge.        Left eye: No discharge.     Conjunctiva/sclera: Conjunctivae normal.  Cardiovascular:     Rate and Rhythm: Normal rate and regular rhythm.     Heart sounds: Normal heart sounds.  Pulmonary:     Effort: Pulmonary effort is normal. No respiratory distress.     Breath sounds: Normal breath sounds.  Chest:     Chest wall: No tenderness.  Abdominal:     Palpations: Abdomen is soft.     Tenderness: There is no abdominal tenderness.  Musculoskeletal:     Cervical back: Neck supple.  Skin:    General: Skin is dry.  Neurological:     General: No focal deficit present.     Mental Status: She is alert. Mental status is at baseline.     Motor: No weakness.     Gait: Gait normal.  Psychiatric:        Mood and  Affect: Mood normal.        Behavior: Behavior normal.        Thought Content: Thought content normal.      UC Treatments / Results  Labs (all labs ordered are listed, but only abnormal results are displayed) Labs Reviewed  GLUCOSE, CAPILLARY - Abnormal; Notable for the following components:      Result Value   Glucose-Capillary 101 (*)    All other  components within normal limits  CBG MONITORING, ED    EKG   Radiology No results found.  CT CORONARY  ADDENDUM REPORT: 08/29/2022 06:31   EXAM: OVER-READ INTERPRETATION  CT CHEST   The following report is an over-read performed by radiologist Dr. Katheren Puller [facility] on 08/29/2022. This over-read does not include interpretation of cardiac or coronary anatomy or pathology. The coronary CTA interpretation by the cardiologist is attached.   COMPARISON:  None.   FINDINGS: Cardiovascular: The heart is normal in size.   Mediastinum/Nodes: Visualized esophagus and trachea within normal limits. No mediastinal lymphadenopathy.   Lungs/Pleura: The lungs are clear bilaterally.   Upper Abdomen: The visualized upper abdomen is within normal limits.   Musculoskeletal: No acute osseous abnormality.   IMPRESSION: No acute or significant extracardiac, intrathoracic abnormality.   Marliss Coots, MD   [facility]   [facility]     Electronically Signed   By: Marliss Coots M.D.   On: 08/29/2022 06:31  Procedures ED EKG  Date/Time: 01/13/2023 4:07 PM  Performed by: Shirlee Latch, PA-C Authorized by: Shirlee Latch, PA-C   Previous ECG:    Previous ECG:  Compared to current   Comparison ECG info:  No significant changes from 06/2021 Interpretation:    Interpretation: non-specific   Rate:    ECG rate:  87   ECG rate assessment: normal   Rhythm:    Rhythm: sinus rhythm   Ectopy:    Ectopy: none   QRS:    QRS axis:  Normal   QRS intervals:  Normal   QRS conduction: normal   ST segments:    ST segments:  Normal T waves:    T waves: normal   Comments:     NSR, RR. No ST or T wave changes  (including critical care time)  Medications Ordered in UC Medications - No data to display  Initial Impression / Assessment and Plan / UC Course  I have reviewed the triage vital signs and the nursing  notes.  Pertinent labs & imaging results that were available during my care of the patient were reviewed by me and considered in my medical decision making (see chart for details).   67 year old female presents for dizziness and chest tightness over the past hour.  Patient has a known history of dizzy spells over the past 7 to 9 months and has followed up with cardiology and neurology regarding these concerns.  Her last appointments with cardiology and neurology were both last month.  Has follow-up appointments with both specialties.  Patient reports taking a BC for possible headache and says symptoms have improved.  She also reports having chest tightness all the time and history of reflux.  She says her chest tightness is more in the epigastric area which is typical.  Takes GERD meds.  Reviewed cardiology and neurology notes per chart review.  Per Cardiology from 12/08/22 "Stress echocardiogram showed normal left ventricular function at rest and at peak exercise with an appropriate blood pressure response.  Cardiac CTA shows minimal nonobstructive disease (0-24%)."  Review of coronary CT performed in February of this year shows no acute or significant extracardiac, intrathoracic abnormality.   Vitals are all normal and stable today.  Orthostatics negative.  EKG shows normal sinus rhythm and regular rate.  Exam is benign today including cranial nerve exam and patient does have 5 out of 5 strength bilateral upper and lower extremities.  Chest clear to auscultation heart regular rate and rhythm.  CBG is 101.   At this time, she is satisfied that her vital signs are normal and stable and her EKG is normal.  Advised patient to take her meclizine at home if she still feeling dizzy and to rest and stay hydrated.  Reviewed going to ED for any acute worsening of her symptoms especially if she were to develop any red flag signs or symptoms which I did discuss with her.  Suspect current episode is consistent with  her chronic issue.  Patient may have migraines.  She is on nortriptyline to see if her dizziness improves and has a follow-up appointment next month with neurology.  She is feeling better now.  Exacerbation of chronic underlying condition.  Final Clinical Impressions(s) / UC Diagnoses   Final diagnoses:  Dizziness  Chest tightness     Discharge Instructions          -Your vital signs look great, blood sugar is not too high or low, and your EKG is with out any significant abnormality. - It is a great sign that you are feeling better and your symptoms have improved. - Go home and take your meclizine which should help if this is related to positional vertigo. We also discussed it being possibly migraine related so continue nortriptyline and BC as needed. - Rest and increase your fluid intake. - If it happens again, you can either distract yourself or lay down and focus on your breathing.  Consider getting a small bite of food and hydrating. -Continue to follow up with specialist. - If it is ever accompanied by severe headache, feeling faint or passing out, chest pain, shortness of breath, feel like your heart is racing or fluttering, nausea/vomiting, weakness, etc. you need to go to emergency department.     ED Prescriptions   None    PDMP not reviewed this encounter.   Shirlee Latch, PA-C 01/13/23 1611

## 2023-01-19 DIAGNOSIS — M5136 Other intervertebral disc degeneration, lumbar region: Secondary | ICD-10-CM | POA: Diagnosis not present

## 2023-01-19 DIAGNOSIS — M62830 Muscle spasm of back: Secondary | ICD-10-CM | POA: Diagnosis not present

## 2023-01-19 DIAGNOSIS — M9903 Segmental and somatic dysfunction of lumbar region: Secondary | ICD-10-CM | POA: Diagnosis not present

## 2023-01-19 DIAGNOSIS — M9905 Segmental and somatic dysfunction of pelvic region: Secondary | ICD-10-CM | POA: Diagnosis not present

## 2023-02-09 DIAGNOSIS — R200 Anesthesia of skin: Secondary | ICD-10-CM | POA: Diagnosis not present

## 2023-02-09 DIAGNOSIS — R42 Dizziness and giddiness: Secondary | ICD-10-CM | POA: Diagnosis not present

## 2023-02-09 DIAGNOSIS — R519 Headache, unspecified: Secondary | ICD-10-CM | POA: Diagnosis not present

## 2023-02-09 DIAGNOSIS — R202 Paresthesia of skin: Secondary | ICD-10-CM | POA: Diagnosis not present

## 2023-02-09 DIAGNOSIS — H539 Unspecified visual disturbance: Secondary | ICD-10-CM | POA: Diagnosis not present

## 2023-02-16 DIAGNOSIS — M9905 Segmental and somatic dysfunction of pelvic region: Secondary | ICD-10-CM | POA: Diagnosis not present

## 2023-02-16 DIAGNOSIS — M62830 Muscle spasm of back: Secondary | ICD-10-CM | POA: Diagnosis not present

## 2023-02-16 DIAGNOSIS — M5136 Other intervertebral disc degeneration, lumbar region: Secondary | ICD-10-CM | POA: Diagnosis not present

## 2023-02-16 DIAGNOSIS — M9903 Segmental and somatic dysfunction of lumbar region: Secondary | ICD-10-CM | POA: Diagnosis not present

## 2023-03-16 DIAGNOSIS — M9903 Segmental and somatic dysfunction of lumbar region: Secondary | ICD-10-CM | POA: Diagnosis not present

## 2023-03-16 DIAGNOSIS — M62830 Muscle spasm of back: Secondary | ICD-10-CM | POA: Diagnosis not present

## 2023-03-16 DIAGNOSIS — M9905 Segmental and somatic dysfunction of pelvic region: Secondary | ICD-10-CM | POA: Diagnosis not present

## 2023-03-16 DIAGNOSIS — M5136 Other intervertebral disc degeneration, lumbar region: Secondary | ICD-10-CM | POA: Diagnosis not present

## 2023-03-24 ENCOUNTER — Ambulatory Visit: Payer: Medicare Other | Admitting: Family Medicine

## 2023-03-25 ENCOUNTER — Ambulatory Visit: Payer: Medicare Other | Admitting: Family Medicine

## 2023-03-28 ENCOUNTER — Ambulatory Visit: Payer: Medicare Other | Admitting: Family Medicine

## 2023-04-04 ENCOUNTER — Encounter: Payer: Self-pay | Admitting: Family Medicine

## 2023-04-04 ENCOUNTER — Ambulatory Visit (INDEPENDENT_AMBULATORY_CARE_PROVIDER_SITE_OTHER): Payer: Medicare Other | Admitting: Family Medicine

## 2023-04-04 VITALS — BP 110/76 | HR 83 | Ht 66.0 in | Wt 222.0 lb

## 2023-04-04 DIAGNOSIS — E7800 Pure hypercholesterolemia, unspecified: Secondary | ICD-10-CM

## 2023-04-04 DIAGNOSIS — I10 Essential (primary) hypertension: Secondary | ICD-10-CM

## 2023-04-04 DIAGNOSIS — R5383 Other fatigue: Secondary | ICD-10-CM | POA: Diagnosis not present

## 2023-04-04 DIAGNOSIS — R7303 Prediabetes: Secondary | ICD-10-CM

## 2023-04-04 DIAGNOSIS — Z23 Encounter for immunization: Secondary | ICD-10-CM

## 2023-04-04 DIAGNOSIS — H8309 Labyrinthitis, unspecified ear: Secondary | ICD-10-CM

## 2023-04-04 MED ORDER — LISINOPRIL-HYDROCHLOROTHIAZIDE 10-12.5 MG PO TABS
1.0000 | ORAL_TABLET | Freq: Every day | ORAL | 1 refills | Status: DC
Start: 2023-04-04 — End: 2023-09-15

## 2023-04-04 MED ORDER — MECLIZINE HCL 25 MG PO TABS
25.0000 mg | ORAL_TABLET | Freq: Three times a day (TID) | ORAL | 1 refills | Status: AC | PRN
Start: 2023-04-04 — End: ?

## 2023-04-04 MED ORDER — ROSUVASTATIN CALCIUM 20 MG PO TABS
20.0000 mg | ORAL_TABLET | Freq: Every day | ORAL | 1 refills | Status: DC
Start: 2023-04-04 — End: 2023-09-15

## 2023-04-04 NOTE — Progress Notes (Signed)
Date:  04/04/2023   Name:  Theresa Gross   DOB:  1955/11/01   MRN:  [id number]   Chief Complaint: Hypertension and Hyperlipidemia  Hypertension This is a chronic problem. The current episode started more than 1 year ago. The problem has been gradually improving since onset. The problem is controlled. Pertinent negatives include no anxiety, blurred vision, chest pain, headaches, neck pain, orthopnea, palpitations, PND, shortness of breath or sweats. There are no associated agents to hypertension. Risk factors for coronary artery disease include dyslipidemia. Past treatments include ACE inhibitors and diuretics. The current treatment provides moderate improvement. There are no compliance problems.  There is no history of angina, kidney disease, CAD/MI, CVA, heart failure, left ventricular hypertrophy, PVD or retinopathy. Identifiable causes of hypertension include a thyroid problem. There is no history of chronic renal disease, a hypertension causing med or renovascular disease.  Hyperlipidemia This is a chronic problem. The current episode started more than 1 year ago. The problem is controlled. Recent lipid tests were reviewed and are normal. She has no history of chronic renal disease. Pertinent negatives include no chest pain, focal sensory loss, focal weakness, leg pain, myalgias or shortness of breath. Current antihyperlipidemic treatment includes statins. The current treatment provides moderate improvement of lipids. There are no compliance problems.   Diabetes She presents for her follow-up diabetic visit. Diabetes type: prediabetes. Her disease course has been stable. Pertinent negatives for hypoglycemia include no confusion, dizziness, headaches, hunger, mood changes, nervousness/anxiousness, pallor, seizures, sleepiness, speech difficulty, sweats or tremors. Associated symptoms include fatigue. Pertinent negatives for diabetes include no blurred vision, no chest pain, no polydipsia, no  polyuria, no visual change and no weight loss. There are no hypoglycemic complications. There are no diabetic complications. Pertinent negatives for diabetic complications include no CVA, PVD or retinopathy. Current diabetic treatment includes diet. Her weight is stable. An ACE inhibitor/angiotensin II receptor blocker is being taken.  Thyroid Problem Presents for initial visit. Symptoms include dry skin and fatigue. Patient reports no anxiety, cold intolerance, constipation, depressed mood, diaphoresis, diarrhea, hair loss, heat intolerance, hoarse voice, nail problem, palpitations, tremors, visual change, weight gain or weight loss. The symptoms have been stable. Her past medical history is significant for hyperlipidemia. There is no history of heart failure.    Lab Results  Component Value Date   NA 140 09/21/2022   K 4.0 09/21/2022   CO2 21 09/21/2022   GLUCOSE 95 09/21/2022   BUN 9 09/21/2022   CREATININE 0.85 09/21/2022   CALCIUM 10.0 09/21/2022   EGFR 75 09/21/2022   GFRNONAA 90 06/13/2019   Lab Results  Component Value Date   CHOL 157 04/19/2022   HDL 58 04/19/2022   LDLCALC 65 04/19/2022   TRIG 206 (H) 04/19/2022   CHOLHDL 4.6 (H) 08/23/2017   No results found for: "TSH" Lab Results  Component Value Date   HGBA1C 5.8 (H) 09/21/2022   Lab Results  Component Value Date   WBC 6.9 05/17/2013   HGB 13.7 05/17/2013   HCT 39.4 05/17/2013   MCV 94 05/17/2013   PLT 241 05/17/2013   Lab Results  Component Value Date   ALT 25 04/19/2022   AST 28 04/19/2022   ALKPHOS 66 04/19/2022   BILITOT 0.7 04/19/2022   No results found for: "25OHVITD2", "25OHVITD3", "VD25OH"   Review of Systems  Constitutional:  Positive for fatigue. Negative for diaphoresis, unexpected weight change, weight gain and weight loss.  HENT:  Negative for hoarse voice and trouble  swallowing.   Eyes:  Negative for blurred vision and visual disturbance.  Respiratory:  Negative for cough, choking, chest  tightness, shortness of breath and wheezing.   Cardiovascular:  Negative for chest pain, palpitations, orthopnea, leg swelling and PND.  Gastrointestinal:  Negative for abdominal pain, blood in stool, constipation and diarrhea.  Endocrine: Negative for cold intolerance, heat intolerance, polydipsia and polyuria.  Genitourinary:  Positive for frequency. Negative for hematuria.  Musculoskeletal:  Negative for myalgias and neck pain.  Skin:  Negative for pallor.  Neurological:  Negative for dizziness, tremors, focal weakness, seizures, speech difficulty and headaches.  Psychiatric/Behavioral:  Negative for confusion. The patient is not nervous/anxious.     Patient Active Problem List   Diagnosis Date Noted   Exertional dyspnea 09/06/2022   History of total abdominal hysterectomy 10/14/2021   Varicose veins of leg with pain, left 11/04/2020   Varicose veins of leg with pain, right 10/07/2020   Osteopenia 08/07/2020   Atypical chest pain 09/01/2017   Class 2 obesity due to excess calories without serious comorbidity with body mass index (BMI) of 37.0 to 37.9 in adult 02/16/2017   Pure hypercholesterolemia 02/16/2017   Aortic atherosclerosis (HCC) 05/25/2016   Steatosis of liver 05/25/2016   Mixed hyperlipidemia 05/25/2016   Essential (primary) hypertension 10/28/2014   Gastro-esophageal reflux disease without esophagitis 10/28/2014   H/O psoriasis 10/28/2014   Decreased potassium in the blood 10/28/2014   Familial multiple lipoprotein-type hyperlipidemia 10/28/2014    Allergies  Allergen Reactions   Ciprofloxacin Nausea And Vomiting   Lipitor [Atorvastatin] Other (See Comments)    Body Aches   Metronidazole Nausea And Vomiting   Nsaids Other (See Comments)    Have to be careful due to liver    Past Surgical History:  Procedure Laterality Date   ABDOMINAL HYSTERECTOMY     BREAST BIOPSY Right 10 plus yrs ago   stereo bx./clip. Benign   CHOLECYSTECTOMY     COLONOSCOPY WITH  PROPOFOL N/A 06/16/2016   Procedure: COLONOSCOPY WITH PROPOFOL;  Surgeon: Scot Jun, MD;  Location: [facility] ENDOSCOPY;  Service: Endoscopy;  Laterality: N/A;   COLONOSCOPY WITH PROPOFOL N/A 12/17/2019   Procedure: COLONOSCOPY WITH PROPOFOL;  Surgeon: Toledo, Boykin Nearing, MD;  Location: [facility] ENDOSCOPY;  Service: Gastroenterology;  Laterality: N/A;   DIAGNOSTIC LAPAROSCOPY     ESOPHAGOGASTRODUODENOSCOPY (EGD) WITH PROPOFOL N/A 12/17/2019   Procedure: ESOPHAGOGASTRODUODENOSCOPY (EGD) WITH PROPOFOL;  Surgeon: Toledo, Boykin Nearing, MD;  Location: [facility] ENDOSCOPY;  Service: Gastroenterology;  Laterality: N/A;   EYE SURGERY     GALLBLADDER SURGERY  06/04/2014   TOTAL ABDOMINAL HYSTERECTOMY     UPPER GASTROINTESTINAL ENDOSCOPY      Social History   Tobacco Use   Smoking status: Former    Types: Cigarettes   Smokeless tobacco: Never  Vaping Use   Vaping status: Never Used  Substance Use Topics   Alcohol use: Yes    Alcohol/week: 1.0 standard drink of alcohol    Types: 1 Cans of beer per week   Drug use: No     Medication list has been reviewed and updated.  Current Meds  Medication Sig   Apremilast (OTEZLA) 30 MG TABS TAKE ONE TABLET BY MOUTH TWICE A DAY - ( DO NOT CRUSH, CHEW, OR SPLIT TABLETS)   aspirin EC 81 MG tablet Take by mouth.   bimatoprost (LATISSE) 0.03 % ophthalmic solution Place 5 mLs into both eyes at bedtime. Place one drop on applicator and apply evenly along the skin of the  upper eyelid at base of eyelashes once daily at bedtime; repeat procedure for second eye (use a clean applicator).   calcipotriene-betamethasone (TACLONEX) external suspension Apply topically daily.   Calcium-Phosphorus-Vitamin D (CITRACAL +D3 PO) Take 2 tablets by mouth daily.   Cholecalciferol (VITAMIN D) 2000 units tablet Take 2,000 Units by mouth daily.   co-enzyme Q-10 30 MG capsule Take 30 mg by mouth daily.   Dexlansoprazole 30 MG capsule DR Take 30 mg by mouth daily. GI   ketoconazole  (NIZORAL) 2 % cream Apply to affected areas psoriasis on face once to twice daily.   lisinopril-hydrochlorothiazide (ZESTORETIC) 10-12.5 MG tablet Take 1 tablet by mouth daily.   meclizine (ANTIVERT) 25 MG tablet Take 1 tablet (25 mg total) by mouth 3 (three) times daily as needed for dizziness.   Mesalamine (ASACOL) 400 MG CPDR DR capsule Take by mouth 2 (two) times daily.   metoCLOPramide (REGLAN) 5 MG tablet Take 5 mg by mouth at bedtime as needed. GI   Multiple Vitamins-Minerals (MULTIVITAMIN WOMEN) TABS Take by mouth.   nortriptyline (PAMELOR) 10 MG capsule Take by mouth.   nystatin (MYCOSTATIN/NYSTOP) powder Apply 1 Application topically 3 (three) times daily.   nystatin (MYCOSTATIN/NYSTOP) powder Apply 2 to 3 times daily as needed for rash at lower abdominal folds   nystatin-triamcinolone (MYCOLOG II) cream Apply 1 application topically 2 (two) times daily. PRN/ Derm   rosuvastatin (CRESTOR) 20 MG tablet Take 1 tablet (20 mg total) by mouth daily.   triamcinolone cream (KENALOG) 0.1 % Apply 1 application topically as directed. Qd to bid aa itching on back until clear, avoid face, groin, axilla       04/04/2023    1:29 PM 09/21/2022   10:48 AM 04/19/2022    9:38 AM 10/14/2021   10:15 AM  GAD 7 : Generalized Anxiety Score  Nervous, Anxious, on Edge 0 0 0 0  Control/stop worrying 0 0 0 0  Worry too much - different things 0 0 0 0  Trouble relaxing 0 0 0 0  Restless 0 0 0 0  Easily annoyed or irritable 0 0 0 0  Afraid - awful might happen 0 0 0 0  Total GAD 7 Score 0 0 0 0  Anxiety Difficulty Not difficult at all Not difficult at all Not difficult at all Not difficult at all       04/04/2023    1:28 PM 09/21/2022   10:47 AM 09/09/2022    9:43 AM  Depression screen PHQ 2/9  Decreased Interest 0 0 0  Down, Depressed, Hopeless 0 0 0  PHQ - 2 Score 0 0 0  Altered sleeping 0 0 0  Tired, decreased energy 0 0 0  Change in appetite 0 0 0  Feeling bad or failure about yourself  0 0 0   Trouble concentrating 0 0 0  Moving slowly or fidgety/restless 0 0 0  Suicidal thoughts 0 0 0  PHQ-9 Score 0 0 0  Difficult doing work/chores Not difficult at all Not difficult at all Not difficult at all    BP Readings from Last 3 Encounters:  04/04/23 110/76  01/13/23 136/87  12/28/22 (!) 154/85    Physical Exam Vitals and nursing note reviewed. Exam conducted with a chaperone present.  Constitutional:      General: She is not in acute distress.    Appearance: She is not diaphoretic.  HENT:     Head: Normocephalic and atraumatic.     Right Ear: Tympanic membrane,  ear canal and external ear normal. There is no impacted cerumen.     Left Ear: Tympanic membrane, ear canal and external ear normal. There is no impacted cerumen.     Nose: Nose normal. No congestion or rhinorrhea.     Mouth/Throat:     Mouth: Mucous membranes are moist.  Eyes:     General:        Right eye: No discharge.        Left eye: No discharge.     Conjunctiva/sclera: Conjunctivae normal.     Pupils: Pupils are equal, round, and reactive to light.  Neck:     Thyroid: No thyromegaly.     Vascular: No JVD.  Cardiovascular:     Rate and Rhythm: Normal rate and regular rhythm.     Heart sounds: Normal heart sounds. No murmur heard.    No friction rub. No gallop.  Pulmonary:     Effort: Pulmonary effort is normal.     Breath sounds: Normal breath sounds. No wheezing, rhonchi or rales.  Abdominal:     General: Bowel sounds are normal.     Palpations: Abdomen is soft. There is no mass.     Tenderness: There is no abdominal tenderness. There is no guarding.  Musculoskeletal:        General: Normal range of motion.     Cervical back: Normal range of motion and neck supple.  Lymphadenopathy:     Cervical: No cervical adenopathy.  Skin:    General: Skin is warm and dry.  Neurological:     Mental Status: She is alert.     Deep Tendon Reflexes:     Reflex Scores:      Patellar reflexes are 2+ on the  right side and 2+ on the left side.    Wt Readings from Last 3 Encounters:  04/04/23 222 lb (100.7 kg)  09/21/22 219 lb (99.3 kg)  09/09/22 222 lb (100.7 kg)    BP 110/76   Pulse 83   Ht 5\' 6"  (1.676 m)   Wt 222 lb (100.7 kg)   SpO2 97%   BMI 35.83 kg/m   Assessment and Plan:  1. Pure hypercholesterolemia Chronic.  Controlled.  Stable. - rosuvastatin (CRESTOR) 20 MG tablet; Take 1 tablet (20 mg total) by mouth daily.  Dispense: 90 tablet; Refill: 1 - Lipid Panel With LDL/HDL Ratio  2. Labyrinthitis, unspecified laterality Chronic controlled followed by Dr. Malvin Johns neurology and currently on nortriptyline. - meclizine (ANTIVERT) 25 MG tablet; Take 1 tablet (25 mg total) by mouth 3 (three) times daily as needed for dizziness.  Dispense: 90 tablet; Refill: 1  3. Essential (primary) hypertension .  Controlled.  Stable.  Blood pressure is 110/76.  Tolerating medication well.  Asymptomatic.  Continue lisinopril hydrochlorothiazide 10-12.5 mg daily.  Will obtain CMP for electrolytes and GFR. - lisinopril-hydrochlorothiazide (ZESTORETIC) 10-12.5 MG tablet; Take 1 tablet by mouth daily.  Dispense: 90 tablet; Refill: 1  4. Fatigue, unspecified type New onset.  Persistent.  Patient is feeling more fatigue and concern for thyroid.  We will proceed with evaluation including CBC CMP thyroid panel.  5. Need for influenza vaccination Discussed and administered - Flu Vaccine Trivalent High Dose (Fluad)  6. Prediabetes Chronic.  Controlled.  Stable.  Will check A1c for current level of control previous A1c was 5.8 and patient has not had dermatology suggesting progression to diabetes such as polyuria polydipsia. - Hemoglobin A1c - Comprehensive metabolic panel - Thyroid Panel With TSH -  CBC with Differential/Platelet    Carmeline Sauer, MD

## 2023-04-05 ENCOUNTER — Encounter: Payer: Self-pay | Admitting: Family Medicine

## 2023-04-05 LAB — COMPREHENSIVE METABOLIC PANEL
ALT: 26 [IU]/L (ref 0–32)
AST: 28 [IU]/L (ref 0–40)
Albumin: 4.5 g/dL (ref 3.9–4.9)
Alkaline Phosphatase: 87 [IU]/L (ref 44–121)
BUN/Creatinine Ratio: 16 (ref 12–28)
BUN: 12 mg/dL (ref 8–27)
Bilirubin Total: 0.4 mg/dL (ref 0.0–1.2)
CO2: 23 mmol/L (ref 20–29)
Calcium: 9.3 mg/dL (ref 8.7–10.3)
Chloride: 100 mmol/L (ref 96–106)
Creatinine, Ser: 0.73 mg/dL (ref 0.57–1.00)
Globulin, Total: 2 g/dL (ref 1.5–4.5)
Glucose: 118 mg/dL — ABNORMAL HIGH (ref 70–99)
Potassium: 4.2 mmol/L (ref 3.5–5.2)
Sodium: 139 mmol/L (ref 134–144)
Total Protein: 6.5 g/dL (ref 6.0–8.5)
eGFR: 90 mL/min/{1.73_m2} (ref >59)

## 2023-04-05 LAB — CBC WITH DIFFERENTIAL/PLATELET
Basophils Absolute: 0 10*3/uL (ref 0.0–0.2)
Basos: 1 %
EOS (ABSOLUTE): 0.1 10*3/uL (ref 0.0–0.4)
Eos: 1 %
Hematocrit: 40.9 % (ref 34.0–46.6)
Hemoglobin: 13.4 g/dL (ref 11.1–15.9)
Immature Grans (Abs): 0.1 10*3/uL (ref 0.0–0.1)
Immature Granulocytes: 1 %
Lymphocytes Absolute: 2.2 10*3/uL (ref 0.7–3.1)
Lymphs: 29 %
MCH: 32 pg (ref 26.6–33.0)
MCHC: 32.8 g/dL (ref 31.5–35.7)
MCV: 98 fL — ABNORMAL HIGH (ref 79–97)
Monocytes Absolute: 0.6 10*3/uL (ref 0.1–0.9)
Monocytes: 8 %
Neutrophils Absolute: 4.7 10*3/uL (ref 1.4–7.0)
Neutrophils: 60 %
Platelets: 222 10*3/uL (ref 150–450)
RBC: 4.19 x10E6/uL (ref 3.77–5.28)
RDW: 12.9 % (ref 11.7–15.4)
WBC: 7.7 10*3/uL (ref 3.4–10.8)

## 2023-04-05 LAB — LIPID PANEL WITH LDL/HDL RATIO
Cholesterol, Total: 163 mg/dL (ref 100–199)
HDL: 50 mg/dL (ref >39)
LDL Chol Calc (NIH): 69 mg/dL (ref 0–99)
LDL/HDL Ratio: 1.4 {ratio} (ref 0.0–3.2)
Triglycerides: 277 mg/dL — ABNORMAL HIGH (ref 0–149)
VLDL Cholesterol Cal: 44 mg/dL — ABNORMAL HIGH (ref 5–40)

## 2023-04-05 LAB — THYROID PANEL WITH TSH
Free Thyroxine Index: 1.9 (ref 1.2–4.9)
T3 Uptake Ratio: 21 % — ABNORMAL LOW (ref 24–39)
T4, Total: 8.9 ug/dL (ref 4.5–12.0)
TSH: 1.2 u[IU]/mL (ref 0.450–4.500)

## 2023-04-05 LAB — HEMOGLOBIN A1C
Est. average glucose Bld gHb Est-mCnc: 117 mg/dL
Hgb A1c MFr Bld: 5.7 % — ABNORMAL HIGH (ref 4.8–5.6)

## 2023-04-11 DIAGNOSIS — R42 Dizziness and giddiness: Secondary | ICD-10-CM | POA: Diagnosis not present

## 2023-04-11 DIAGNOSIS — R519 Headache, unspecified: Secondary | ICD-10-CM | POA: Diagnosis not present

## 2023-04-11 DIAGNOSIS — R200 Anesthesia of skin: Secondary | ICD-10-CM | POA: Diagnosis not present

## 2023-04-11 DIAGNOSIS — H539 Unspecified visual disturbance: Secondary | ICD-10-CM | POA: Diagnosis not present

## 2023-04-11 DIAGNOSIS — R202 Paresthesia of skin: Secondary | ICD-10-CM | POA: Diagnosis not present

## 2023-04-13 DIAGNOSIS — M9903 Segmental and somatic dysfunction of lumbar region: Secondary | ICD-10-CM | POA: Diagnosis not present

## 2023-04-13 DIAGNOSIS — M9905 Segmental and somatic dysfunction of pelvic region: Secondary | ICD-10-CM | POA: Diagnosis not present

## 2023-04-13 DIAGNOSIS — M62830 Muscle spasm of back: Secondary | ICD-10-CM | POA: Diagnosis not present

## 2023-04-13 DIAGNOSIS — M5136 Other intervertebral disc degeneration, lumbar region with discogenic back pain only: Secondary | ICD-10-CM | POA: Diagnosis not present

## 2023-05-11 DIAGNOSIS — M9903 Segmental and somatic dysfunction of lumbar region: Secondary | ICD-10-CM | POA: Diagnosis not present

## 2023-05-11 DIAGNOSIS — M5136 Other intervertebral disc degeneration, lumbar region with discogenic back pain only: Secondary | ICD-10-CM | POA: Diagnosis not present

## 2023-05-11 DIAGNOSIS — M9905 Segmental and somatic dysfunction of pelvic region: Secondary | ICD-10-CM | POA: Diagnosis not present

## 2023-05-11 DIAGNOSIS — M62830 Muscle spasm of back: Secondary | ICD-10-CM | POA: Diagnosis not present

## 2023-05-13 ENCOUNTER — Other Ambulatory Visit: Payer: Self-pay | Admitting: Medical Genetics

## 2023-05-13 DIAGNOSIS — Z006 Encounter for examination for normal comparison and control in clinical research program: Secondary | ICD-10-CM

## 2023-05-16 ENCOUNTER — Other Ambulatory Visit
Admission: RE | Admit: 2023-05-16 | Discharge: 2023-05-16 | Disposition: A | Discharge status: Home / Self Care | Payer: Self-pay | Source: Ambulatory Visit | Attending: Medical Genetics | Admitting: Medical Genetics

## 2023-05-16 DIAGNOSIS — Z006 Encounter for examination for normal comparison and control in clinical research program: Secondary | ICD-10-CM | POA: Insufficient documentation

## 2023-05-18 DIAGNOSIS — M62830 Muscle spasm of back: Secondary | ICD-10-CM | POA: Diagnosis not present

## 2023-05-18 DIAGNOSIS — M9903 Segmental and somatic dysfunction of lumbar region: Secondary | ICD-10-CM | POA: Diagnosis not present

## 2023-05-18 DIAGNOSIS — M9905 Segmental and somatic dysfunction of pelvic region: Secondary | ICD-10-CM | POA: Diagnosis not present

## 2023-05-18 DIAGNOSIS — M5136 Other intervertebral disc degeneration, lumbar region with discogenic back pain only: Secondary | ICD-10-CM | POA: Diagnosis not present

## 2023-05-24 LAB — HELIX MOLECULAR SCREEN: Genetic Analysis Overall Interpretation: NEGATIVE

## 2023-05-24 LAB — GENECONNECT MOLECULAR SCREEN

## 2023-05-25 DIAGNOSIS — M5136 Other intervertebral disc degeneration, lumbar region with discogenic back pain only: Secondary | ICD-10-CM | POA: Diagnosis not present

## 2023-05-25 DIAGNOSIS — M9903 Segmental and somatic dysfunction of lumbar region: Secondary | ICD-10-CM | POA: Diagnosis not present

## 2023-05-25 DIAGNOSIS — M62830 Muscle spasm of back: Secondary | ICD-10-CM | POA: Diagnosis not present

## 2023-05-25 DIAGNOSIS — M9905 Segmental and somatic dysfunction of pelvic region: Secondary | ICD-10-CM | POA: Diagnosis not present

## 2023-06-08 DIAGNOSIS — R42 Dizziness and giddiness: Secondary | ICD-10-CM | POA: Diagnosis not present

## 2023-06-08 DIAGNOSIS — I10 Essential (primary) hypertension: Secondary | ICD-10-CM | POA: Diagnosis not present

## 2023-06-08 DIAGNOSIS — I493 Ventricular premature depolarization: Secondary | ICD-10-CM | POA: Diagnosis not present

## 2023-06-08 DIAGNOSIS — R0609 Other forms of dyspnea: Secondary | ICD-10-CM | POA: Diagnosis not present

## 2023-06-08 DIAGNOSIS — E782 Mixed hyperlipidemia: Secondary | ICD-10-CM | POA: Diagnosis not present

## 2023-06-14 ENCOUNTER — Other Ambulatory Visit: Payer: Self-pay | Admitting: Dermatology

## 2023-06-14 DIAGNOSIS — L409 Psoriasis, unspecified: Secondary | ICD-10-CM

## 2023-06-21 ENCOUNTER — Ambulatory Visit: Payer: Medicare Other | Admitting: Dermatology

## 2023-06-21 ENCOUNTER — Encounter: Payer: Self-pay | Admitting: Dermatology

## 2023-06-21 DIAGNOSIS — L720 Epidermal cyst: Secondary | ICD-10-CM | POA: Diagnosis not present

## 2023-06-21 DIAGNOSIS — L409 Psoriasis, unspecified: Secondary | ICD-10-CM | POA: Diagnosis not present

## 2023-06-21 DIAGNOSIS — B079 Viral wart, unspecified: Secondary | ICD-10-CM

## 2023-06-21 DIAGNOSIS — L820 Inflamed seborrheic keratosis: Secondary | ICD-10-CM | POA: Diagnosis not present

## 2023-06-21 DIAGNOSIS — Z7189 Other specified counseling: Secondary | ICD-10-CM

## 2023-06-21 DIAGNOSIS — Z79899 Other long term (current) drug therapy: Secondary | ICD-10-CM | POA: Diagnosis not present

## 2023-06-21 DIAGNOSIS — R42 Dizziness and giddiness: Secondary | ICD-10-CM | POA: Diagnosis not present

## 2023-06-21 MED ORDER — OTEZLA 30 MG PO TABS
ORAL_TABLET | ORAL | 1 refills | Status: DC
Start: 2023-06-21 — End: 2023-12-13

## 2023-06-21 NOTE — Progress Notes (Signed)
Follow-Up Visit   Subjective  Theresa Gross is a 67 y.o. female who presents for the following: Psoriasis  Patient currently on [place]. No side effects. Using samples of Vtama at glabella, taclonex at back scalp as needed.   Patient with a spot at right hand that she bumps and it gets irritated.  Also irritating spots behind ear.  The following portions of the chart were reviewed this encounter and updated as appropriate: medications, allergies, medical history  Review of Systems:  No other skin or systemic complaints except as noted in HPI or Assessment and Plan.  Objective  Well appearing patient in no apparent distress; mood and affect are within normal limits.  Areas Examined: Face, scalp, arms, hands and fingers  Relevant exam findings are noted in the Assessment and Plan.    Right Postauricular Erythematous stuck-on, waxy papule right 2nd MCP Keratotic papule  Assessment & Plan   INFLAMED SEBORRHEIC KERATOSIS Right Postauricular Symptomatic, irritating, patient would like treated.   Destruction of lesion - Right Postauricular  Destruction method: cryotherapy   Informed consent: discussed and consent obtained   Lesion destroyed using liquid nitrogen: Yes   Region frozen until ice ball extended beyond lesion: Yes   Outcome: patient tolerated procedure well with no complications   Post-procedure details: wound care instructions given   Additional details:  Prior to procedure, discussed risks of blister formation, small wound, skin dyspigmentation, or rare scar following cryotherapy. Recommend Vaseline ointment to treated areas while healing.  VIRAL WARTS, UNSPECIFIED TYPE right 2nd MCP Viral Wart (HPV) Counseling  Discussed viral / HPV (Human Papilloma Virus) etiology and risk of spread /infectivity to other areas of body as well as to other people.  Multiple treatments and methods may be required to clear warts and it is possible treatment may not be  successful.  Treatment risks include discoloration; scarring and there is still potential for wart recurrence. Destruction of lesion - right 2nd MCP  Destruction method: cryotherapy   Informed consent: discussed and consent obtained   Lesion destroyed using liquid nitrogen: Yes   Region frozen until ice ball extended beyond lesion: Yes   Outcome: patient tolerated procedure well with no complications   Post-procedure details: wound care instructions given   Additional details:  Prior to procedure, discussed risks of blister formation, small wound, skin dyspigmentation, or rare scar following cryotherapy. Recommend Vaseline ointment to treated areas while healing.  PSORIASIS   Related Medications ketoconazole (NIZORAL) 2 % cream Apply to affected areas psoriasis on face once to twice daily. calcipotriene-betamethasone (TACLONEX) external suspension Apply topically daily. Apremilast (OTEZLA) 30 MG TABS TAKE ONE TABLET BY MOUTH TWICE A DAY - ( DO NOT CRUSH, CHEW, OR SPLIT TABLETS)  PSORIASIS Mild erythema with scale at elbows, postauricular scalp, glabella, eyebrows 1% BSA.  Chronic condition with duration or expected duration over one year. Currently well-controlled.   Treatment Plan: Continue sample of Vtama daily to aa face prn Continue Otezla 30 mg bid Continue Taclonex to aas scalp at bedtime prn flares  Side effects of Otezla (apremilast) include diarrhea, nausea, headache, upper respiratory infection, depression, and weight decrease (5-10%). It should only be taken by pregnant women after a discussion regarding risks and benefits with their doctor. Goal is control of skin condition, not cure.  The use of [place] requires long term medication management, including periodic office visits.   Counseling on psoriasis and coordination of care  psoriasis is a chronic non-curable, but treatable genetic/hereditary disease that may have  other systemic features affecting other organ  systems such as joints (Psoriatic Arthritis). It is associated with an increased risk of inflammatory bowel disease, heart disease, non-alcoholic fatty liver disease, and depression.  Treatments include light and laser treatments; topical medications; and systemic medications including oral and injectables.  Milia - tiny firm white papules - type of cyst - benign - sometimes these will clear with nightly OTC adapalene/Differin 0.1% gel or retinol. - may be extracted if symptomatic - observe   Return in about 6 months (around 12/20/2023) for Psoriasis, with Dr. Roseanne Reno.  Anise Salvo, RMA, am acting as scribe for Willeen Niece, MD .   Documentation: I have reviewed the above documentation for accuracy and completeness, and I agree with the above.  Willeen Niece, MD

## 2023-06-21 NOTE — Patient Instructions (Signed)
Cryotherapy Aftercare  Wash gently with soap and water everyday.   Apply Vaseline and Band-Aid daily until healed.   Side effects of Otezla (apremilast) include diarrhea, nausea, headache, upper respiratory infection, depression, and weight decrease (5-10%). It should only be taken by pregnant women after a discussion regarding risks and benefits with their doctor. Goal is control of skin condition, not cure.  The use of [place] requires long term medication management, including periodic office visits.

## 2023-06-23 DIAGNOSIS — M62830 Muscle spasm of back: Secondary | ICD-10-CM | POA: Diagnosis not present

## 2023-06-23 DIAGNOSIS — M9903 Segmental and somatic dysfunction of lumbar region: Secondary | ICD-10-CM | POA: Diagnosis not present

## 2023-06-23 DIAGNOSIS — M5136 Other intervertebral disc degeneration, lumbar region with discogenic back pain only: Secondary | ICD-10-CM | POA: Diagnosis not present

## 2023-06-23 DIAGNOSIS — M9905 Segmental and somatic dysfunction of pelvic region: Secondary | ICD-10-CM | POA: Diagnosis not present

## 2023-07-19 DIAGNOSIS — M9903 Segmental and somatic dysfunction of lumbar region: Secondary | ICD-10-CM | POA: Diagnosis not present

## 2023-07-19 DIAGNOSIS — M9905 Segmental and somatic dysfunction of pelvic region: Secondary | ICD-10-CM | POA: Diagnosis not present

## 2023-07-19 DIAGNOSIS — M5417 Radiculopathy, lumbosacral region: Secondary | ICD-10-CM | POA: Diagnosis not present

## 2023-07-19 DIAGNOSIS — M5416 Radiculopathy, lumbar region: Secondary | ICD-10-CM | POA: Diagnosis not present

## 2023-08-02 DIAGNOSIS — Y92009 Unspecified place in unspecified non-institutional (private) residence as the place of occurrence of the external cause: Secondary | ICD-10-CM | POA: Diagnosis not present

## 2023-08-02 DIAGNOSIS — R42 Dizziness and giddiness: Secondary | ICD-10-CM | POA: Diagnosis not present

## 2023-08-02 DIAGNOSIS — R202 Paresthesia of skin: Secondary | ICD-10-CM | POA: Diagnosis not present

## 2023-08-02 DIAGNOSIS — H539 Unspecified visual disturbance: Secondary | ICD-10-CM | POA: Diagnosis not present

## 2023-08-02 DIAGNOSIS — W19XXXD Unspecified fall, subsequent encounter: Secondary | ICD-10-CM | POA: Diagnosis not present

## 2023-08-02 DIAGNOSIS — R200 Anesthesia of skin: Secondary | ICD-10-CM | POA: Diagnosis not present

## 2023-08-08 ENCOUNTER — Other Ambulatory Visit: Payer: Self-pay | Admitting: Neurology

## 2023-08-08 DIAGNOSIS — R42 Dizziness and giddiness: Secondary | ICD-10-CM

## 2023-08-16 DIAGNOSIS — M9903 Segmental and somatic dysfunction of lumbar region: Secondary | ICD-10-CM | POA: Diagnosis not present

## 2023-08-16 DIAGNOSIS — M5416 Radiculopathy, lumbar region: Secondary | ICD-10-CM | POA: Diagnosis not present

## 2023-08-16 DIAGNOSIS — M5417 Radiculopathy, lumbosacral region: Secondary | ICD-10-CM | POA: Diagnosis not present

## 2023-08-16 DIAGNOSIS — M9905 Segmental and somatic dysfunction of pelvic region: Secondary | ICD-10-CM | POA: Diagnosis not present

## 2023-08-17 ENCOUNTER — Ambulatory Visit
Admission: RE | Admit: 2023-08-17 | Discharge: 2023-08-17 | Disposition: A | Discharge status: Home / Self Care | Payer: Medicare Other | Source: Ambulatory Visit | Attending: Neurology | Admitting: Neurology

## 2023-08-17 DIAGNOSIS — R519 Headache, unspecified: Secondary | ICD-10-CM | POA: Diagnosis not present

## 2023-08-17 DIAGNOSIS — R42 Dizziness and giddiness: Secondary | ICD-10-CM

## 2023-08-17 DIAGNOSIS — R9082 White matter disease, unspecified: Secondary | ICD-10-CM | POA: Diagnosis not present

## 2023-09-12 DIAGNOSIS — K30 Functional dyspepsia: Secondary | ICD-10-CM | POA: Diagnosis not present

## 2023-09-12 DIAGNOSIS — K219 Gastro-esophageal reflux disease without esophagitis: Secondary | ICD-10-CM | POA: Diagnosis not present

## 2023-09-12 DIAGNOSIS — K5150 Left sided colitis without complications: Secondary | ICD-10-CM | POA: Diagnosis not present

## 2023-09-12 DIAGNOSIS — K449 Diaphragmatic hernia without obstruction or gangrene: Secondary | ICD-10-CM | POA: Diagnosis not present

## 2023-09-12 DIAGNOSIS — K2950 Unspecified chronic gastritis without bleeding: Secondary | ICD-10-CM | POA: Diagnosis not present

## 2023-09-13 DIAGNOSIS — M9905 Segmental and somatic dysfunction of pelvic region: Secondary | ICD-10-CM | POA: Diagnosis not present

## 2023-09-13 DIAGNOSIS — M5417 Radiculopathy, lumbosacral region: Secondary | ICD-10-CM | POA: Diagnosis not present

## 2023-09-13 DIAGNOSIS — M5416 Radiculopathy, lumbar region: Secondary | ICD-10-CM | POA: Diagnosis not present

## 2023-09-13 DIAGNOSIS — M9903 Segmental and somatic dysfunction of lumbar region: Secondary | ICD-10-CM | POA: Diagnosis not present

## 2023-09-15 ENCOUNTER — Encounter: Payer: Self-pay | Admitting: Family Medicine

## 2023-09-15 ENCOUNTER — Ambulatory Visit (INDEPENDENT_AMBULATORY_CARE_PROVIDER_SITE_OTHER): Payer: Medicare Other | Admitting: Family Medicine

## 2023-09-15 VITALS — BP 118/76 | HR 84 | Ht 66.0 in | Wt 220.0 lb

## 2023-09-15 DIAGNOSIS — E7800 Pure hypercholesterolemia, unspecified: Secondary | ICD-10-CM

## 2023-09-15 DIAGNOSIS — I10 Essential (primary) hypertension: Secondary | ICD-10-CM | POA: Diagnosis not present

## 2023-09-15 DIAGNOSIS — L304 Erythema intertrigo: Secondary | ICD-10-CM | POA: Diagnosis not present

## 2023-09-15 MED ORDER — NYSTATIN 100000 UNIT/GM EX POWD
CUTANEOUS | 3 refills | Status: AC
Start: 2023-09-15 — End: ?

## 2023-09-15 MED ORDER — ROSUVASTATIN CALCIUM 20 MG PO TABS
20.0000 mg | ORAL_TABLET | Freq: Every day | ORAL | 1 refills | Status: AC
Start: 2023-09-15 — End: ?

## 2023-09-15 MED ORDER — LISINOPRIL-HYDROCHLOROTHIAZIDE 10-12.5 MG PO TABS
1.0000 | ORAL_TABLET | Freq: Every day | ORAL | 1 refills | Status: AC
Start: 2023-09-15 — End: ?

## 2023-09-15 NOTE — Patient Instructions (Signed)

## 2023-09-15 NOTE — Progress Notes (Signed)
 Date:  09/15/2023   Name:  Theresa Gross   DOB:  01-25-1956   MRN:  [id number]   Chief Complaint: Hypertension  hyperten  Hypertension This is a chronic problem. The current episode started more than 1 year ago. The problem has been gradually improving since onset. The problem is controlled. Pertinent negatives include no anxiety, blurred vision, chest pain, headaches, malaise/fatigue, neck pain, orthopnea, palpitations, peripheral edema, PND, shortness of breath or sweats. There are no associated agents to hypertension. Risk factors for coronary artery disease include dyslipidemia. Past treatments include ACE inhibitors and diuretics. The current treatment provides moderate improvement. There are no compliance problems.  There is no history of CAD/MI or CVA. There is no history of chronic renal disease, a hypertension causing med or renovascular disease.  Hyperlipidemia This is a chronic problem. The current episode started more than 1 year ago. The problem is controlled. Recent lipid tests were reviewed and are normal. She has no history of chronic renal disease. Pertinent negatives include no chest pain, myalgias or shortness of breath. Current antihyperlipidemic treatment includes diet change and statins. The current treatment provides moderate improvement of lipids. There are no compliance problems.     Lab Results  Component Value Date   NA 139 04/04/2023   K 4.2 04/04/2023   CO2 23 04/04/2023   GLUCOSE 118 (H) 04/04/2023   BUN 12 04/04/2023   CREATININE 0.73 04/04/2023   CALCIUM 9.3 04/04/2023   EGFR 90 04/04/2023   GFRNONAA 90 06/13/2019   Lab Results  Component Value Date   CHOL 163 04/04/2023   HDL 50 04/04/2023   LDLCALC 69 04/04/2023   TRIG 277 (H) 04/04/2023   CHOLHDL 4.6 (H) 08/23/2017   Lab Results  Component Value Date   TSH 1.200 04/04/2023   Lab Results  Component Value Date   HGBA1C 5.7 (H) 04/04/2023   Lab Results  Component Value Date   WBC 7.7  04/04/2023   HGB 13.4 04/04/2023   HCT 40.9 04/04/2023   MCV 98 (H) 04/04/2023   PLT 222 04/04/2023   Lab Results  Component Value Date   ALT 26 04/04/2023   AST 28 04/04/2023   ALKPHOS 87 04/04/2023   BILITOT 0.4 04/04/2023   No results found for: "25OHVITD2", "25OHVITD3", "VD25OH"   Review of Systems  Constitutional: Negative.  Negative for chills, malaise/fatigue and unexpected weight change.  HENT:  Negative for congestion, ear discharge, ear pain, rhinorrhea, sinus pressure, sneezing and sore throat.   Eyes:  Negative for blurred vision.  Respiratory:  Negative for cough, shortness of breath, wheezing and stridor.   Cardiovascular:  Negative for chest pain, palpitations, orthopnea and PND.  Gastrointestinal:  Negative for abdominal pain, blood in stool, constipation, diarrhea and nausea.  Genitourinary:  Negative for difficulty urinating, dysuria, flank pain, frequency, hematuria, urgency, vaginal bleeding and vaginal discharge.  Musculoskeletal:  Negative for arthralgias, back pain, myalgias and neck pain.  Skin:  Negative for rash.  Neurological:  Negative for dizziness, weakness and headaches.  Hematological:  Negative for adenopathy. Does not bruise/bleed easily.  Psychiatric/Behavioral:  Negative for dysphoric mood. The patient is not nervous/anxious.     Patient Active Problem List   Diagnosis Date Noted   Exertional dyspnea 09/06/2022   History of total abdominal hysterectomy 10/14/2021   Varicose veins of leg with pain, left 11/04/2020   Varicose veins of leg with pain, right 10/07/2020   Osteopenia 08/07/2020   Atypical chest pain 09/01/2017  Class 2 obesity due to excess calories without serious comorbidity with body mass index (BMI) of 37.0 to 37.9 in adult 02/16/2017   Pure hypercholesterolemia 02/16/2017   Aortic atherosclerosis (HCC) 05/25/2016   Steatosis of liver 05/25/2016   Mixed hyperlipidemia 05/25/2016   Essential (primary) hypertension 10/28/2014    Gastro-esophageal reflux disease without esophagitis 10/28/2014   H/O psoriasis 10/28/2014   Decreased potassium in the blood 10/28/2014   Familial multiple lipoprotein-type hyperlipidemia 10/28/2014    Allergies  Allergen Reactions   Ciprofloxacin Nausea And Vomiting   Lipitor [Atorvastatin] Other (See Comments)    Body Aches   Metronidazole Nausea And Vomiting   Nsaids Other (See Comments)    Have to be careful due to liver    Past Surgical History:  Procedure Laterality Date   ABDOMINAL HYSTERECTOMY     BREAST BIOPSY Right 10 plus yrs ago   stereo bx./clip. Benign   CHOLECYSTECTOMY     COLONOSCOPY WITH PROPOFOL N/A 06/16/2016   Procedure: COLONOSCOPY WITH PROPOFOL;  Surgeon: Scot Jun, MD;  Location: [facility] ENDOSCOPY;  Service: Endoscopy;  Laterality: N/A;   COLONOSCOPY WITH PROPOFOL N/A 12/17/2019   Procedure: COLONOSCOPY WITH PROPOFOL;  Surgeon: Toledo, Boykin Nearing, MD;  Location: [facility] ENDOSCOPY;  Service: Gastroenterology;  Laterality: N/A;   DIAGNOSTIC LAPAROSCOPY     ESOPHAGOGASTRODUODENOSCOPY (EGD) WITH PROPOFOL N/A 12/17/2019   Procedure: ESOPHAGOGASTRODUODENOSCOPY (EGD) WITH PROPOFOL;  Surgeon: Toledo, Boykin Nearing, MD;  Location: [facility] ENDOSCOPY;  Service: Gastroenterology;  Laterality: N/A;   EYE SURGERY     GALLBLADDER SURGERY  06/04/2014   TOTAL ABDOMINAL HYSTERECTOMY     UPPER GASTROINTESTINAL ENDOSCOPY      Social History   Tobacco Use   Smoking status: Former    Types: Cigarettes   Smokeless tobacco: Never  Vaping Use   Vaping status: Never Used  Substance Use Topics   Alcohol use: Yes    Alcohol/week: 1.0 standard drink of alcohol    Types: 1 Cans of beer per week   Drug use: No     Medication list has been reviewed and updated.  Current Meds  Medication Sig   Apremilast (OTEZLA) 30 MG TABS TAKE ONE TABLET BY MOUTH TWICE A DAY - ( DO NOT CRUSH, CHEW, OR SPLIT TABLETS)   aspirin EC 81 MG tablet Take by mouth.   calcipotriene-betamethasone  (TACLONEX) external suspension Apply topically daily.   Calcium-Phosphorus-Vitamin D (CITRACAL +D3 PO) Take 2 tablets by mouth daily.   Cholecalciferol (VITAMIN D) 2000 units tablet Take 2,000 Units by mouth daily.   co-enzyme Q-10 30 MG capsule Take 30 mg by mouth daily.   ketoconazole (NIZORAL) 2 % cream Apply to affected areas psoriasis on face once to twice daily.   meclizine (ANTIVERT) 25 MG tablet Take 1 tablet (25 mg total) by mouth 3 (three) times daily as needed for dizziness.   Mesalamine (ASACOL) 400 MG CPDR DR capsule Take by mouth 2 (two) times daily.   Multiple Vitamins-Minerals (MULTIVITAMIN WOMEN) TABS Take by mouth.   nortriptyline (PAMELOR) 10 MG capsule Take by mouth.   nystatin-triamcinolone (MYCOLOG II) cream Apply 1 application topically 2 (two) times daily. PRN/ Derm   triamcinolone cream (KENALOG) 0.1 % Apply 1 application topically as directed. Qd to bid aa itching on back until clear, avoid face, groin, axilla   [DISCONTINUED] lisinopril-hydrochlorothiazide (ZESTORETIC) 10-12.5 MG tablet Take 1 tablet by mouth daily.   [DISCONTINUED] nystatin (MYCOSTATIN/NYSTOP) powder Apply 1 Application topically 3 (three) times daily.   [DISCONTINUED] nystatin (  MYCOSTATIN/NYSTOP) powder Apply 2 to 3 times daily as needed for rash at lower abdominal folds   [DISCONTINUED] rosuvastatin (CRESTOR) 20 MG tablet Take 1 tablet (20 mg total) by mouth daily.       09/15/2023    9:58 AM 04/04/2023    1:29 PM 09/21/2022   10:48 AM 04/19/2022    9:38 AM  GAD 7 : Generalized Anxiety Score  Nervous, Anxious, on Edge 0 0 0 0  Control/stop worrying 0 0 0 0  Worry too much - different things 0 0 0 0  Trouble relaxing 0 0 0 0  Restless 0 0 0 0  Easily annoyed or irritable 0 0 0 0  Afraid - awful might happen 0 0 0 0  Total GAD 7 Score 0 0 0 0  Anxiety Difficulty Not difficult at all Not difficult at all Not difficult at all Not difficult at all       09/15/2023    9:58 AM 04/04/2023     1:28 PM 09/21/2022   10:47 AM  Depression screen PHQ 2/9  Decreased Interest 0 0 0  Down, Depressed, Hopeless 0 0 0  PHQ - 2 Score 0 0 0  Altered sleeping  0 0  Tired, decreased energy  0 0  Change in appetite  0 0  Feeling bad or failure about yourself   0 0  Trouble concentrating  0 0  Moving slowly or fidgety/restless  0 0  Suicidal thoughts  0 0  PHQ-9 Score  0 0  Difficult doing work/chores  Not difficult at all Not difficult at all    BP Readings from Last 3 Encounters:  09/15/23 118/76  04/04/23 110/76  01/13/23 136/87    Physical Exam Vitals and nursing note reviewed.  Constitutional:      General: She is not in acute distress.    Appearance: She is not diaphoretic.  HENT:     Head: Normocephalic and atraumatic.     Right Ear: External ear normal.     Left Ear: External ear normal.     Nose: Nose normal.     Mouth/Throat:     Mouth: Mucous membranes are moist.  Eyes:     General:        Right eye: No discharge.        Left eye: No discharge.     Conjunctiva/sclera: Conjunctivae normal.     Pupils: Pupils are equal, round, and reactive to light.  Neck:     Thyroid: No thyromegaly.     Vascular: No JVD.  Cardiovascular:     Rate and Rhythm: Normal rate and regular rhythm.     Heart sounds: Normal heart sounds. No murmur heard.    No friction rub. No gallop.  Pulmonary:     Effort: Pulmonary effort is normal.     Breath sounds: Normal breath sounds. No wheezing, rhonchi or rales.  Abdominal:     General: Bowel sounds are normal.     Palpations: Abdomen is soft. There is no mass.     Tenderness: There is no abdominal tenderness. There is no guarding.  Musculoskeletal:        General: Normal range of motion.     Cervical back: Normal range of motion and neck supple.  Lymphadenopathy:     Cervical: No cervical adenopathy.  Skin:    General: Skin is warm and dry.     Findings: No lesion.  Neurological:     Mental Status: She  is alert.     Deep Tendon  Reflexes: Reflexes are normal and symmetric.     Wt Readings from Last 3 Encounters:  09/15/23 220 lb (99.8 kg)  04/04/23 222 lb (100.7 kg)  09/21/22 219 lb (99.3 kg)    BP 118/76   Pulse 84   Ht 5\' 6"  (1.676 m)   Wt 220 lb (99.8 kg)   SpO2 100%   BMI 35.51 kg/m   Assessment and Plan: 1. Essential (primary) hypertension (Primary) Chronic.  Controlled.  Stable.  Blood pressure today is 118/76.  Asymptomatic.  Tolerating medications well.  Continue lisinopril hydrochlorothiazide 10-12.5 mg once a day.  Will check CMP for electrolytes and GFR.  Will recheck patient in 6 months. - lisinopril-hydrochlorothiazide (ZESTORETIC) 10-12.5 MG tablet; Take 1 tablet by mouth daily.  Dispense: 90 tablet; Refill: 1 - Comprehensive metabolic panel  2. Erythema intertrigo Chronic.  Intermittent.  Patient has episodes of breakout beneath the breasts and inguinal area.  We will treat with nystatin powder and if necessary patient can call and I will refill her nystatin cream. - nystatin (MYCOSTATIN/NYSTOP) powder; Apply 2 to 3 times daily as needed for rash at lower abdominal folds  Dispense: 60 g; Refill: 3  3. Pure hypercholesterolemia Chronic.  Controlled.  Stable.  Asymptomatic.  Tolerating current dosing of rosuvastatin 20 mg once a day.  Will recheck lipid panel - rosuvastatin (CRESTOR) 20 MG tablet; Take 1 tablet (20 mg total) by mouth daily.  Dispense: 90 tablet; Refill: 1 - Lipid Panel With LDL/HDL Ratio - Comprehensive metabolic panel     Aldora Sauer, MD

## 2023-09-16 ENCOUNTER — Encounter: Payer: Self-pay | Admitting: Family Medicine

## 2023-09-16 LAB — LIPID PANEL WITH LDL/HDL RATIO
Cholesterol, Total: 179 mg/dL (ref 100–199)
HDL: 59 mg/dL (ref >39)
LDL Chol Calc (NIH): 83 mg/dL (ref 0–99)
LDL/HDL Ratio: 1.4 ratio (ref 0.0–3.2)
Triglycerides: 222 mg/dL — ABNORMAL HIGH (ref 0–149)
VLDL Cholesterol Cal: 37 mg/dL (ref 5–40)

## 2023-09-16 LAB — COMPREHENSIVE METABOLIC PANEL
ALT: 26 IU/L (ref 0–32)
AST: 25 IU/L (ref 0–40)
Albumin: 4.7 g/dL (ref 3.9–4.9)
Alkaline Phosphatase: 95 IU/L (ref 44–121)
BUN/Creatinine Ratio: 11 — ABNORMAL LOW (ref 12–28)
BUN: 9 mg/dL (ref 8–27)
Bilirubin Total: 0.6 mg/dL (ref 0.0–1.2)
CO2: 23 mmol/L (ref 20–29)
Calcium: 9.9 mg/dL (ref 8.7–10.3)
Chloride: 101 mmol/L (ref 96–106)
Creatinine, Ser: 0.8 mg/dL (ref 0.57–1.00)
Globulin, Total: 2.5 g/dL (ref 1.5–4.5)
Glucose: 93 mg/dL (ref 70–99)
Potassium: 4.4 mmol/L (ref 3.5–5.2)
Sodium: 141 mmol/L (ref 134–144)
Total Protein: 7.2 g/dL (ref 6.0–8.5)
eGFR: 80 mL/min/{1.73_m2} (ref >59)

## 2023-09-27 DIAGNOSIS — M8580 Other specified disorders of bone density and structure, unspecified site: Secondary | ICD-10-CM | POA: Diagnosis not present

## 2023-09-27 DIAGNOSIS — I10 Essential (primary) hypertension: Secondary | ICD-10-CM | POA: Diagnosis not present

## 2023-09-27 DIAGNOSIS — L409 Psoriasis, unspecified: Secondary | ICD-10-CM | POA: Diagnosis not present

## 2023-09-27 DIAGNOSIS — K219 Gastro-esophageal reflux disease without esophagitis: Secondary | ICD-10-CM | POA: Diagnosis not present

## 2023-09-27 DIAGNOSIS — E782 Mixed hyperlipidemia: Secondary | ICD-10-CM | POA: Diagnosis not present

## 2023-10-10 DIAGNOSIS — M9905 Segmental and somatic dysfunction of pelvic region: Secondary | ICD-10-CM | POA: Diagnosis not present

## 2023-10-10 DIAGNOSIS — M9903 Segmental and somatic dysfunction of lumbar region: Secondary | ICD-10-CM | POA: Diagnosis not present

## 2023-10-10 DIAGNOSIS — M5416 Radiculopathy, lumbar region: Secondary | ICD-10-CM | POA: Diagnosis not present

## 2023-10-10 DIAGNOSIS — M5417 Radiculopathy, lumbosacral region: Secondary | ICD-10-CM | POA: Diagnosis not present

## 2023-10-20 ENCOUNTER — Ambulatory Visit: Payer: Medicare Other

## 2023-11-07 ENCOUNTER — Other Ambulatory Visit: Payer: Self-pay | Admitting: Internal Medicine

## 2023-11-07 DIAGNOSIS — Z961 Presence of intraocular lens: Secondary | ICD-10-CM | POA: Diagnosis not present

## 2023-11-07 DIAGNOSIS — H353131 Nonexudative age-related macular degeneration, bilateral, early dry stage: Secondary | ICD-10-CM | POA: Diagnosis not present

## 2023-11-07 DIAGNOSIS — H35373 Puckering of macula, bilateral: Secondary | ICD-10-CM | POA: Diagnosis not present

## 2023-11-07 DIAGNOSIS — H43811 Vitreous degeneration, right eye: Secondary | ICD-10-CM | POA: Diagnosis not present

## 2023-11-07 DIAGNOSIS — Z1231 Encounter for screening mammogram for malignant neoplasm of breast: Secondary | ICD-10-CM

## 2023-11-09 DIAGNOSIS — M5416 Radiculopathy, lumbar region: Secondary | ICD-10-CM | POA: Diagnosis not present

## 2023-11-09 DIAGNOSIS — M5417 Radiculopathy, lumbosacral region: Secondary | ICD-10-CM | POA: Diagnosis not present

## 2023-11-09 DIAGNOSIS — M9905 Segmental and somatic dysfunction of pelvic region: Secondary | ICD-10-CM | POA: Diagnosis not present

## 2023-11-09 DIAGNOSIS — M9903 Segmental and somatic dysfunction of lumbar region: Secondary | ICD-10-CM | POA: Diagnosis not present

## 2023-12-07 DIAGNOSIS — M9903 Segmental and somatic dysfunction of lumbar region: Secondary | ICD-10-CM | POA: Diagnosis not present

## 2023-12-07 DIAGNOSIS — M5417 Radiculopathy, lumbosacral region: Secondary | ICD-10-CM | POA: Diagnosis not present

## 2023-12-07 DIAGNOSIS — M5416 Radiculopathy, lumbar region: Secondary | ICD-10-CM | POA: Diagnosis not present

## 2023-12-07 DIAGNOSIS — M9905 Segmental and somatic dysfunction of pelvic region: Secondary | ICD-10-CM | POA: Diagnosis not present

## 2023-12-13 ENCOUNTER — Ambulatory Visit: Payer: Medicare Other | Admitting: Dermatology

## 2023-12-13 DIAGNOSIS — L820 Inflamed seborrheic keratosis: Secondary | ICD-10-CM

## 2023-12-13 DIAGNOSIS — D485 Neoplasm of uncertain behavior of skin: Secondary | ICD-10-CM

## 2023-12-13 DIAGNOSIS — L409 Psoriasis, unspecified: Secondary | ICD-10-CM | POA: Diagnosis not present

## 2023-12-13 DIAGNOSIS — D492 Neoplasm of unspecified behavior of bone, soft tissue, and skin: Secondary | ICD-10-CM

## 2023-12-13 DIAGNOSIS — Z7189 Other specified counseling: Secondary | ICD-10-CM

## 2023-12-13 DIAGNOSIS — D224 Melanocytic nevi of scalp and neck: Secondary | ICD-10-CM

## 2023-12-13 DIAGNOSIS — Z79899 Other long term (current) drug therapy: Secondary | ICD-10-CM | POA: Diagnosis not present

## 2023-12-13 MED ORDER — OTEZLA 30 MG PO TABS: ORAL_TABLET | ORAL | 1 refills | Status: DC

## 2023-12-13 MED ORDER — VTAMA 1 % EX CREA: TOPICAL_CREAM | CUTANEOUS | 1 refills | Status: AC

## 2023-12-13 MED ORDER — CALCIPOTRIENE-BETAMETH DIPROP 0.005-0.064 % EX SUSP
Freq: Every day | CUTANEOUS | 1 refills | Status: AC
Start: 2023-12-13 — End: ?

## 2023-12-13 NOTE — Patient Instructions (Addendum)
 Wound Care Instructions  Cleanse wound gently with soap and water once a day then pat dry with clean gauze. Apply a thin coat of Petrolatum (petroleum jelly, "Vaseline") over the wound (unless you have an allergy to this). We recommend that you use a new, sterile tube of Vaseline. Do not pick or remove scabs. Do not remove the yellow or white "healing tissue" from the base of the wound.  Cover the wound with fresh, clean, nonstick gauze and secure with paper tape. You may use Band-Aids in place of gauze and tape if the wound is small enough, but would recommend trimming much of the tape off as there is often too much. Sometimes Band-Aids can irritate the skin.  You should call the office for your biopsy report after 1 week if you have not already been contacted.  If you experience any problems, such as abnormal amounts of bleeding, swelling, significant bruising, significant pain, or evidence of infection, please call the office immediately.  FOR ADULT SURGERY PATIENTS: If you need something for pain relief you may take 1 extra strength Tylenol (acetaminophen) AND 2 Ibuprofen (200mg  each) together every 4 hours as needed for pain. (do not take these if you are allergic to them or if you have a reason you should not take them.) Typically, you may only need pain medication for 1 to 3 days.     Side effects of Otezla  (apremilast ) include diarrhea, nausea, headache, upper respiratory infection, depression, and weight decrease (5-10%). It should only be taken by pregnant women after a discussion regarding risks and benefits with their doctor. Goal is control of skin condition, not cure.  The use of Otezla  requires long term medication management, including periodic office visits.    Due to recent changes in healthcare laws, you may see results of your pathology and/or laboratory studies on MyChart before the doctors have had a chance to review them. We understand that in some cases there may be results  that are confusing or concerning to you. Please understand that not all results are received at the same time and often the doctors may need to interpret multiple results in order to provide you with the best plan of care or course of treatment. Therefore, we ask that you please give us  2 business days to thoroughly review all your results before contacting the office for clarification. Should we see a critical lab result, you will be contacted sooner.   If You Need Anything After Your Visit  If you have any questions or concerns for your doctor, please call our main line at [phone] and press option 4 to reach your doctor's medical assistant. If no one answers, please leave a voicemail as directed and we will return your call as soon as possible. Messages left after 4 pm will be answered the following business day.   You may also send us  a message via MyChart. We typically respond to MyChart messages within 1-2 business days.  For prescription refills, please ask your pharmacy to contact our office. Our fax number is [phone].  If you have an urgent issue when the clinic is closed that cannot wait until the next business day, you can page your doctor at the number below.    Please note that while we do our best to be available for urgent issues outside of office hours, we are not available 24/7.   If you have an urgent issue and are unable to reach us , you may choose to seek medical care  at your doctor's office, retail clinic, urgent care center, or emergency room.  If you have a medical emergency, please immediately call 911 or go to the emergency department.  Pager Numbers  - Dr. Bary Likes: [phone]  - Dr. Annette Barters: [phone]  - Dr. Felipe Horton: [phone]   In the event of inclement weather, please call our main line at [phone] for an update on the status of any delays or closures.  Dermatology Medication Tips: Please keep the boxes that topical medications come in in  order to help keep track of the instructions about where and how to use these. Pharmacies typically print the medication instructions only on the boxes and not directly on the medication tubes.   If your medication is too expensive, please contact our office at [phone] option 4 or send us  a message through MyChart.   We are unable to tell what your co-pay for medications will be in advance as this is different depending on your insurance coverage. However, we may be able to find a substitute medication at lower cost or fill out paperwork to get insurance to cover a needed medication.   If a prior authorization is required to get your medication covered by your insurance company, please allow us  1-2 business days to complete this process.  Drug prices often vary depending on where the prescription is filled and some pharmacies may offer cheaper prices.  The website www.goodrx.com contains coupons for medications through different pharmacies. The prices here do not account for what the cost may be with help from insurance (it may be cheaper with your insurance), but the website can give you the price if you did not use any insurance.  - You can print the associated coupon and take it with your prescription to the pharmacy.  - You may also stop by our office during regular business hours and pick up a GoodRx coupon card.  - If you need your prescription sent electronically to a different pharmacy, notify our office through [facility] or by phone at [phone] option 4.     Si Usted Necesita Algo Despus de Su Visita  Tambin puede enviarnos un mensaje a travs de Clinical cytogeneticist. Por lo general respondemos a los mensajes de MyChart en el transcurso de 1 a 2 das hbiles.  Para renovar recetas, por favor pida a su farmacia que se ponga en contacto con nuestra oficina. Franz Jacks de fax es [place] [phone].  Si tiene un asunto urgente cuando la clnica est cerrada y que no puede  esperar hasta el siguiente da hbil, puede llamar/localizar a su doctor(a) al nmero que aparece a continuacin.   Por favor, tenga en cuenta que aunque hacemos todo lo posible para estar disponibles para asuntos urgentes fuera del horario de [place], no estamos disponibles las 24 horas del da, los 7 [address]  [address] de la [place].   Si tiene un problema urgente y no puede comunicarse con nosotros, puede optar por buscar atencin mdica  en el consultorio de su doctor(a), en una clnica privada, en un centro de atencin urgente o en una sala de emergencias.  Si tiene Engineer, drilling, por favor llame inmediatamente al 911 o vaya a la sala de emergencias.  Nmeros de bper  - Dr. Bary Likes: [phone]  - Dra. Annette Barters: [phone]  - Dr. Felipe Horton: [phone]   En caso de inclemencias del tiempo, por favor llame a Lajuan Pila principal al [phone] para una actualizacin sobre el [place] de cualquier retraso o cierre.  Consejos para la  medicacin en dermatologa: Por favor, guarde las cajas en las que vienen los medicamentos de uso tpico para ayudarle a seguir las instrucciones sobre dnde y cmo usarlos. Las farmacias generalmente imprimen las instrucciones del medicamento slo en las cajas y no directamente en los tubos del [place].   Si su medicamento es muy caro, por favor, pngase en contacto con Bettyjane Brunet llamando al [phone] y presione la opcin 4 o envenos un mensaje a travs de Clinical cytogeneticist.   No podemos decirle cul ser su copago por los medicamentos por adelantado ya que esto es diferente dependiendo de la cobertura de su seguro. Sin embargo, es posible que podamos encontrar un medicamento sustituto a Audiological scientist un formulario para que el seguro cubra el medicamento que se considera necesario.   Si se requiere una autorizacin previa para que su compaa de seguros [place] su medicamento, por favor permtanos de 1 a 2 das hbiles para completar este proceso.  Los  precios de los medicamentos varan con frecuencia dependiendo del Environmental consultant de dnde se surte la receta y alguna farmacias pueden ofrecer precios ms baratos.  El sitio web www.goodrx.com tiene cupones para medicamentos de Health and safety inspector. Los precios aqu no tienen en cuenta lo que podra costar con la ayuda del seguro (puede ser ms barato con su seguro), pero el sitio web puede darle el precio si no utiliz Tourist information centre manager.  - Puede imprimir el cupn correspondiente y llevarlo con su receta a la farmacia.  - Tambin puede pasar por nuestra oficina durante el horario de atencin regular y Education officer, museum una tarjeta de cupones de GoodRx.  - Si necesita que su receta se enve electrnicamente a una farmacia diferente, informe a nuestra oficina a travs de MyChart de [place] o por telfono llamando al [phone] y presione la opcin 4.

## 2023-12-13 NOTE — Progress Notes (Signed)
 Follow-Up Visit   Subjective  Theresa Gross is a 68 y.o. female who presents for the following: Psoriasis   Patient currently on Otezla . No side effects. Skin well controlled. Using samples of Vtama  at glabella, taclonex at back scalp as needed. She has irritated mole on hairline scalp that she would like removed.  Also a couple irritated spots on face.  The following portions of the chart were reviewed this encounter and updated as appropriate: medications, allergies, medical history  Review of Systems:  No other skin or systemic complaints except as noted in HPI or Assessment and Plan.  Objective  Well appearing patient in no apparent distress; mood and affect are within normal limits.  Areas Examined: Face, scalp, elbows  Relevant exam findings are noted in the Assessment and Plan.    Right Occipital Hairline 4 mm pink flesh papule  R temporal hairline x 1, R med eyebrow x 1 (2) Erythematous stuck-on, waxy papule or plaque  Assessment & Plan  NEOPLASM OF UNCERTAIN BEHAVIOR OF SKIN Right Occipital Hairline Epidermal / dermal shaving  Lesion diameter (cm):  0.4 Informed consent: discussed and consent obtained   Patient was prepped and draped in usual sterile fashion: Area prepped with alcohol. Anesthesia: the lesion was anesthetized in a standard fashion   Anesthetic:  1% lidocaine  w/ epinephrine 1-100,000 buffered w/ 8.4% NaHCO3 Instrument used: flexible razor blade   Hemostasis achieved with: pressure, aluminum chloride and electrodesiccation   Outcome: patient tolerated procedure well   Post-procedure details: wound care instructions given   Post-procedure details comment:  Ointment and small bandage applied Specimen 1 - Surgical pathology Differential Diagnosis: Irritated Nevus vs other Check Margins: No INFLAMED SEBORRHEIC KERATOSIS (2) R temporal hairline x 1, R med eyebrow x 1 (2) Symptomatic, irritating, patient would like treated. Destruction of lesion  - R temporal hairline x 1, R med eyebrow x 1 (2)  Destruction method: cryotherapy   Informed consent: discussed and consent obtained   Lesion destroyed using liquid nitrogen: Yes   Region frozen until ice ball extended beyond lesion: Yes   Outcome: patient tolerated procedure well with no complications   Post-procedure details: wound care instructions given   Additional details:  Prior to procedure, discussed risks of blister formation, small wound, skin dyspigmentation, or rare scar following cryotherapy. Recommend Vaseline ointment to treated areas while healing.  PSORIASIS   Related Medications ketoconazole  (NIZORAL ) 2 % cream Apply to affected areas psoriasis on face once to twice daily. Apremilast  (OTEZLA ) 30 MG TABS TAKE ONE TABLET BY MOUTH TWICE A DAY - ( DO NOT CRUSH, CHEW, OR SPLIT TABLETS) calcipotriene -betamethasone (TACLONEX) external suspension Apply topically daily.  PSORIASIS  Erythema at occipital scalp at hairline; mild erythema and scale at glabella and eyebrows <1% BSA.  Chronic condition with duration or expected duration over one year. Currently well-controlled.  Counseling and coordination of care for severe psoriasis on systemic treatment  psoriasis - severe on systemic treatment.  Psoriasis is a chronic non-curable, but treatable genetic/hereditary disease that may have other systemic features affecting other organ systems such as joints (Psoriatic Arthritis).  It is linked with heart disease, inflammatory bowel disease, non-alcoholic fatty liver disease, and depression. Significant skin psoriasis and/or psoriatic arthritis may have significant symptoms and affects activities of daily activity and often benefits from systemic treatments.  These systemic treatments have some potential side effects including immunosuppression and may require pre-treatment laboratory screening and periodic laboratory monitoring and periodic in person evaluation and monitoring by the  attending dermatologist physician (long term medication management).   Patient denies joint pain  Treatment Plan: Continue sample of Vtama  daily to aa face prn. Samples given to patient today x 4.  Continue Otezla  30 mg bid dsp #180 1Rf. Continue Taclonex to aas scalp at bedtime prn flares.   Side effects of Otezla  (apremilast ) include diarrhea, nausea, headache, upper respiratory infection, depression, and weight decrease (5-10%). It should only be taken by pregnant women after a discussion regarding risks and benefits with their doctor. Goal is control of skin condition, not cure.  The use of Otezla  requires long term medication management, including periodic office visits.  Long term medication management.  Patient is using long term (months to years) prescription medication  to control their dermatologic condition.  These medications require periodic monitoring to evaluate for efficacy and side effects and may require periodic laboratory monitoring.     Return in about 6 months (around 06/13/2024) for Psoriasis.  IBernardine Bridegroom, CMA, am acting as scribe for Artemio Larry, MD .   Documentation: I have reviewed the above documentation for accuracy and completeness, and I agree with the above.  Artemio Larry, MD

## 2023-12-14 ENCOUNTER — Ambulatory Visit

## 2023-12-19 ENCOUNTER — Ambulatory Visit: Payer: Self-pay | Admitting: Dermatology

## 2023-12-19 NOTE — Telephone Encounter (Signed)
-----   Message from Artemio Larry sent at 12/19/2023 11:56 AM EDT -----  1. Skin, right occipital hairline :       MELANOCYTIC NEVUS, INTRADERMAL TYPE, BASE INVOLVED   Benign mole - please call patient ----- Message ----- From: Interface, Lab In [facility] Sent: 12/16/2023   1:08 PM EDT To: Artemio Larry, MD

## 2023-12-19 NOTE — Telephone Encounter (Signed)
 Advised pt of bx results/sh ?

## 2024-01-09 ENCOUNTER — Ambulatory Visit
Admission: RE | Admit: 2024-01-09 | Discharge: 2024-01-09 | Disposition: A | Discharge status: Home / Self Care | Source: Ambulatory Visit | Attending: Internal Medicine | Admitting: Internal Medicine

## 2024-01-09 DIAGNOSIS — Z1231 Encounter for screening mammogram for malignant neoplasm of breast: Secondary | ICD-10-CM | POA: Insufficient documentation

## 2024-06-05 ENCOUNTER — Ambulatory Visit: Admitting: Dermatology

## 2024-06-05 DIAGNOSIS — C4492 Squamous cell carcinoma of skin, unspecified: Secondary | ICD-10-CM

## 2024-06-05 DIAGNOSIS — L409 Psoriasis, unspecified: Secondary | ICD-10-CM | POA: Diagnosis not present

## 2024-06-05 DIAGNOSIS — C44629 Squamous cell carcinoma of skin of left upper limb, including shoulder: Secondary | ICD-10-CM

## 2024-06-05 DIAGNOSIS — D485 Neoplasm of uncertain behavior of skin: Secondary | ICD-10-CM

## 2024-06-05 DIAGNOSIS — Z79899 Other long term (current) drug therapy: Secondary | ICD-10-CM

## 2024-06-05 HISTORY — DX: Squamous cell carcinoma of skin, unspecified: C44.92

## 2024-06-05 MED ORDER — OTEZLA 30 MG PO TABS: ORAL_TABLET | ORAL | 1 refills | Status: AC

## 2024-06-05 NOTE — Progress Notes (Unsigned)
 Follow-Up Visit   Subjective  Theresa Gross is a 68 y.o. female who presents for the following: Growth on the left hand dorsum x 1 month, sore.  Psoriasis of the scalp improved with Otezla  30MG  bid. She also uses Taclonex solution to the scalp and Vtama  samples to face, as needed.   The following portions of the chart were reviewed this encounter and updated as appropriate: medications, allergies, medical history  Review of Systems:  No other skin or systemic complaints except as noted in HPI or Assessment and Plan.  Objective  Well appearing patient in no apparent distress; mood and affect are within normal limits.  A focused examination was performed of the following areas: Left hand  Relevant physical exam findings are noted in the Assessment and Plan.  Left hand dorsum 8.0 mm pink keratotic papule    Assessment & Plan  PSORIASIS Exam: Scaly patch at right occipital scalp <1% BSA.  Chronic condition with duration or expected duration over one year. Currently well-controlled.  Patient denies joint pain  Psoriasis is a chronic non-curable, but treatable genetic/hereditary disease that may have other systemic features affecting other organ systems such as joints (Psoriatic Arthritis). It is associated with an increased risk of inflammatory bowel disease, heart disease, non-alcoholic fatty liver disease, and depression.  Treatments include light and laser treatments; topical medications; and systemic medications including oral and injectables.  Treatment Plan: Continue sample of Vtama  daily to aa face prn. Samples given to patient today x 3, Lot [id number] 09/2026.  Continue Otezla  30 mg bid dsp #180 1Rf. Continue Taclonex susp. to aas scalp at bedtime prn flares.   Side effects of Otezla  (apremilast ) include diarrhea, nausea, headache, upper respiratory infection, depression, and weight decrease (5-10%). It should only be taken by pregnant women after a discussion regarding  risks and benefits with their doctor. Goal is control of skin condition, not cure.  The use of Otezla  requires long term medication management, including periodic office visits.   Long term medication management.  Patient is using long term (months to years) prescription medication  to control their dermatologic condition.  These medications require periodic monitoring to evaluate for efficacy and side effects and may require periodic laboratory monitoring.   NEOPLASM OF UNCERTAIN BEHAVIOR OF SKIN Left hand dorsum Epidermal / dermal shaving  Lesion diameter (cm):  0.8 Informed consent: discussed and consent obtained   Patient was prepped and draped in usual sterile fashion: Area prepped with alcohol. Anesthesia: the lesion was anesthetized in a standard fashion   Anesthetic:  1% lidocaine  w/ epinephrine 1-100,000 buffered w/ 8.4% NaHCO3 Instrument used: flexible razor blade   Hemostasis achieved with: pressure, aluminum chloride and electrodesiccation   Outcome: patient tolerated procedure well    Destruction of lesion  Destruction method: electrodesiccation and curettage   Informed consent: discussed and consent obtained   Curettage performed in three different directions: Yes   Electrodesiccation performed over the curetted area: Yes   Final wound size (cm):  0.9 Hemostasis achieved with:  pressure, aluminum chloride and electrodesiccation Outcome: patient tolerated procedure well with no complications   Post-procedure details: wound care instructions given   Post-procedure details comment:  Ointment and bandage applied.  Specimen 1 - Surgical pathology Differential Diagnosis: Hypertrophic AK r/o SCC Check Margins: No EDC today PSORIASIS   Related Medications ketoconazole  (NIZORAL ) 2 % cream Apply to affected areas psoriasis on face once to twice daily. calcipotriene -betamethasone (TACLONEX) external suspension Apply topically daily. Apremilast  (OTEZLA ) 30 MG  TABS TAKE ONE  TABLET BY MOUTH TWICE A DAY - ( DO NOT CRUSH, CHEW, OR SPLIT TABLETS)   Return in about 6 months (around 12/04/2024).  IAndrea Kerns, CMA, am acting as scribe for Rexene Rattler, MD .   Documentation: I have reviewed the above documentation for accuracy and completeness, and I agree with the above.  Rexene Rattler, MD

## 2024-06-05 NOTE — Patient Instructions (Addendum)

## 2024-06-06 LAB — SURGICAL PATHOLOGY

## 2024-06-11 ENCOUNTER — Encounter: Payer: Self-pay | Admitting: Dermatology

## 2024-06-11 ENCOUNTER — Ambulatory Visit: Payer: Self-pay | Admitting: Dermatology

## 2024-06-11 NOTE — Telephone Encounter (Signed)
-----   Message from Rexene Rattler sent at 06/11/2024  1:34 PM EST ----- 1. Skin, left hand dorsum :       WELL DIFFERENTIATED SQUAMOUS CELL CARCINOMA  SCC skin cancer- already treated with EDC at time of biopsy   - please call patient ----- Message ----- From: Interface, Lab In [facility] Sent: 06/06/2024   3:48 PM EST To: Rexene Rattler, MD

## 2024-06-11 NOTE — Telephone Encounter (Signed)
 Advised patient of bx results./sh

## 2024-06-19 ENCOUNTER — Ambulatory Visit: Admitting: Dermatology

## 2024-12-18 ENCOUNTER — Ambulatory Visit: Admitting: Dermatology
